# Patient Record
Sex: Female | Born: 1965 | Race: White | Hispanic: No | Marital: Married | Smoking: Never smoker
Health system: Southern US, Community
[De-identification: ages and names within clinical notes are randomized; demographics above are authoritative.]

## PROBLEM LIST (undated history)

## (undated) DIAGNOSIS — Z87442 Personal history of urinary calculi: Secondary | ICD-10-CM

## (undated) DIAGNOSIS — N189 Chronic kidney disease, unspecified: Secondary | ICD-10-CM

## (undated) DIAGNOSIS — M1990 Unspecified osteoarthritis, unspecified site: Secondary | ICD-10-CM

## (undated) DIAGNOSIS — I10 Essential (primary) hypertension: Secondary | ICD-10-CM

## (undated) DIAGNOSIS — J45909 Unspecified asthma, uncomplicated: Secondary | ICD-10-CM

## (undated) DIAGNOSIS — R87619 Unspecified abnormal cytological findings in specimens from cervix uteri: Secondary | ICD-10-CM

## (undated) DIAGNOSIS — J189 Pneumonia, unspecified organism: Secondary | ICD-10-CM

## (undated) HISTORY — PX: LITHOTRIPSY: SUR834

## (undated) HISTORY — PX: OTHER SURGICAL HISTORY: SHX169

## (undated) HISTORY — PX: TUBAL LIGATION: SHX77

## (undated) HISTORY — DX: Unspecified asthma, uncomplicated: J45.909

## (undated) HISTORY — PX: CHOLECYSTECTOMY: SHX55

## (undated) HISTORY — DX: Chronic kidney disease, unspecified: N18.9

## (undated) HISTORY — DX: Unspecified abnormal cytological findings in specimens from cervix uteri: R87.619

## (undated) HISTORY — DX: Pneumonia, unspecified organism: J18.9

---

## 1998-01-17 HISTORY — PX: OTHER SURGICAL HISTORY: SHX169

## 1999-01-18 HISTORY — PX: TUBAL LIGATION: SHX77

## 2008-01-18 HISTORY — PX: CHOLECYSTECTOMY: SHX55

## 2010-01-17 HISTORY — PX: LITHOTRIPSY: SUR834

## 2011-10-03 LAB — HM MAMMOGRAPHY

## 2015-08-11 ENCOUNTER — Ambulatory Visit (INDEPENDENT_AMBULATORY_CARE_PROVIDER_SITE_OTHER): Payer: BLUE CROSS/BLUE SHIELD | Admitting: Family Medicine

## 2015-08-11 ENCOUNTER — Encounter: Payer: Self-pay | Admitting: Family Medicine

## 2015-08-11 ENCOUNTER — Other Ambulatory Visit: Payer: Self-pay | Admitting: Family Medicine

## 2015-08-11 VITALS — BP 152/106 | HR 74 | Ht 61.5 in | Wt 311.2 lb

## 2015-08-11 DIAGNOSIS — R0989 Other specified symptoms and signs involving the circulatory and respiratory systems: Secondary | ICD-10-CM | POA: Diagnosis not present

## 2015-08-11 DIAGNOSIS — Z833 Family history of diabetes mellitus: Secondary | ICD-10-CM | POA: Insufficient documentation

## 2015-08-11 DIAGNOSIS — Z823 Family history of stroke: Secondary | ICD-10-CM | POA: Insufficient documentation

## 2015-08-11 DIAGNOSIS — Z6841 Body Mass Index (BMI) 40.0 and over, adult: Secondary | ICD-10-CM

## 2015-08-11 DIAGNOSIS — Z9851 Tubal ligation status: Secondary | ICD-10-CM | POA: Insufficient documentation

## 2015-08-11 DIAGNOSIS — J989 Respiratory disorder, unspecified: Secondary | ICD-10-CM | POA: Insufficient documentation

## 2015-08-11 DIAGNOSIS — N200 Calculus of kidney: Secondary | ICD-10-CM | POA: Insufficient documentation

## 2015-08-11 DIAGNOSIS — Z8249 Family history of ischemic heart disease and other diseases of the circulatory system: Secondary | ICD-10-CM | POA: Insufficient documentation

## 2015-08-11 DIAGNOSIS — Z0189 Encounter for other specified special examinations: Secondary | ICD-10-CM

## 2015-08-11 DIAGNOSIS — T7840XA Allergy, unspecified, initial encounter: Secondary | ICD-10-CM | POA: Insufficient documentation

## 2015-08-11 DIAGNOSIS — J3089 Other allergic rhinitis: Secondary | ICD-10-CM | POA: Diagnosis not present

## 2015-08-11 DIAGNOSIS — E559 Vitamin D deficiency, unspecified: Secondary | ICD-10-CM | POA: Insufficient documentation

## 2015-08-11 LAB — POCT GLYCOSYLATED HEMOGLOBIN (HGB A1C): HEMOGLOBIN A1C: 5.1

## 2015-08-11 NOTE — Progress Notes (Signed)
New patient office visit note:  Impression and Recommendations:    1. Obesity   2. Reactive airway disease that is not asthma   3. Environmental and seasonal allergies   4. h/o Nephrolithiasis    5. Family history of diabetes mellitus   6. Family history of stroke   7. Family history of hypertension    We will obtain fasting blood work today  A second blood pressure was obtained in the office today which showed 152/106.   Patient will check her blood pressure over the next couple weeks and follow-up with me. She will bring a log of her blood pressures that she checks at home.  No prior history hypertension or elevated blood pressure.  Patient has personal health forms that need to be filled out; for her medical insurance purposes. They need cholesterol panel, blood pressure and others. She will return to clinic later in the week to get the completed forms  Normal BMI discussed, need for weight loss   Patient's Medications   No medications on file    Return in about 4 weeks (around 09/08/2015) for Blood pressure recheck.  The patient was counseled, risk factors were discussed, anticipatory guidance given.  Gross side effects, risk and benefits, and alternatives of medications discussed with patient.  Patient is aware that all medications have potential side effects and we are unable to predict every side effect or drug-drug interaction that may occur.  Expresses verbal understanding and consents to current therapy plan and treatment regimen.  Please see AVS handed out to patient at the end of our visit for further patient instructions/ counseling done pertaining to today's office visit.    Note: This document was prepared using Dragon voice recognition software and may include unintentional dictation errors.  ----------------------------------------------------------------------------------------------------------------------    Subjective:    Chief Complaint    Patient presents with  . Establish Care    HPI: Abigail Cruz is a pleasant 50 y.o. female who presents to [place] [facility] today to review their medical history with me and establish care.   Patient needs paperwork filled out for her work which is a yearly physical type screening for her medical insurance purposes. She needs cholesterol obtained today and her blood pressure and other vitals.  She has no acute complaints today and her problem list was updated with me today. She takes no medicines even over-the-counter  She has no history of hypertension and says her prior office visits were 120 over 44s. She does not check it at home. She has no complaints of chest pain, shortness of breath, dizziness or headache or visual changes.     Patient Active Problem List   Diagnosis Date Noted  . Obesity 08/11/2015  . Reactive airway disease that is not asthma 08/11/2015  . Environmental and seasonal allergies 08/11/2015  . h/o Nephrolithiasis  08/11/2015  . H/O tubal ligation 08/11/2015  . Family history of diabetes mellitus 08/11/2015  . Family history of stroke 08/11/2015  . Family history of hypertension 08/11/2015     Past Medical History:  Diagnosis Date  . Chronic kidney disease    kidney stones  . Pneumonia      Past Surgical History:  Procedure Laterality Date  . CHOLECYSTECTOMY    . TUBAL LIGATION       Family History  Problem Relation Age of Onset  . Hypertension Mother   . Diabetes Mother   . Cancer Mother  cervical  . Hypertension Father   . Cancer Maternal Aunt     breast  . Cancer Paternal Aunt     breast  . Heart attack Paternal Grandfather   . Stroke Paternal Grandfather      History  Drug Use No    History  Alcohol Use No    History  Smoking Status  . Never Smoker  Smokeless Tobacco  . Never Used    Patient's Medications   No medications on file    Allergies: Bee venom; Iodine; Other; Latex; and  Shellfish-derived products  Review of Systems:   ( Completed via Adult Medical History Intake form today ) General:  Denies fever, chills, appetite changes, unexplained weight loss.  Optho/Auditory:   Denies visual changes, blurred vision/LOV, ringing in ears/ diff hearing Respiratory:   Denies SOB, DOE, cough, wheezing.  Cardiovascular:   Denies chest pain, palpitations, new onset peripheral edema  Gastrointestinal:   Denies nausea, vomiting, diarrhea.  Genitourinary:    Denies dysuria, increased frequency, flank pain.  Endocrine:     Denies hot or cold intolerance, polyuria, polydipsia. Musculoskeletal:  Denies unexplained myalgias, joint swelling, arthralgias, gait problems.  Skin:  Denies rash, suspicious lesions or new/ changes in moles Neurological:    Denies dizziness, syncope, unexplained weakness, lightheadedness, numbness  Psychiatric/Behavioral:   Denies mood changes, suicidal or homicidal ideations, hallucinations    Objective:   Blood pressure (!) 157/84, pulse 74, height 5' 1.5" (1.562 m), weight (!) 311 lb 3.2 oz (141.2 kg), last menstrual period 07/24/2015. Body mass index is 57.85 kg/m.  General: Well Developed, well nourished, and in no acute distress.  Neuro: Alert and oriented x3, extra-ocular muscles intact, sensation grossly intact.  HEENT: Normocephalic, atraumatic, pupils equal round reactive to light, neck supple, no carotid bruits, no JVD apprec Skin: no gross suspicious lesions or rashes  Cardiac: Very distant, difficult to auscultate due to body habitus but essentially regular rate and rhythm, no murmurs rubs or gallops.  Respiratory: Distant, Essentially clear to auscultation bilaterally. Not using accessory muscles, speaking in full sentences.  Musculoskeletal: Ambulates w/o diff, FROM * 4 ext.  Vasc: less 2 sec cap RF, warm and pink  Psych:  No HI/SI, judgement and insight good.

## 2015-08-11 NOTE — Patient Instructions (Signed)
DASH Eating Plan °DASH stands for "Dietary Approaches to Stop Hypertension." The DASH eating plan is a healthy eating plan that has been shown to reduce high blood pressure (hypertension). Additional health benefits may include reducing the risk of type 2 diabetes mellitus, heart disease, and stroke. The DASH eating plan may also help with weight loss. °WHAT DO I NEED TO KNOW ABOUT THE DASH EATING PLAN? °For the DASH eating plan, you will follow these general guidelines: °· Choose foods with a percent daily value for sodium of less than 5% (as listed on the food label). °· Use salt-free seasonings or herbs instead of table salt or sea salt. °· Check with your health care provider or pharmacist before using salt substitutes. °· Eat lower-sodium products, often labeled as "lower sodium" or "no salt added." °· Eat fresh foods. °· Eat more vegetables, fruits, and low-fat dairy products. °· Choose whole grains. Look for the word "whole" as the first word in the ingredient list. °· Choose fish and skinless chicken or turkey more often than red meat. Limit fish, poultry, and meat to 6 oz (170 g) each day. °· Limit sweets, desserts, sugars, and sugary drinks. °· Choose heart-healthy fats. °· Limit cheese to 1 oz (28 g) per day. °· Eat more home-cooked food and less restaurant, buffet, and fast food. °· Limit fried foods. °· Cook foods using methods other than frying. °· Limit canned vegetables. If you do use them, rinse them well to decrease the sodium. °· When eating at a restaurant, ask that your food be prepared with less salt, or no salt if possible. °WHAT FOODS CAN I EAT? °Seek help from a dietitian for individual calorie needs. °Grains °Whole grain or whole wheat bread. Brown rice. Whole grain or whole wheat pasta. Quinoa, bulgur, and whole grain cereals. Low-sodium cereals. Corn or whole wheat flour tortillas. Whole grain cornbread. Whole grain crackers. Low-sodium crackers. °Vegetables °Fresh or frozen vegetables  (raw, steamed, roasted, or grilled). Low-sodium or reduced-sodium tomato and vegetable juices. Low-sodium or reduced-sodium tomato sauce and paste. Low-sodium or reduced-sodium canned vegetables.  °Fruits °All fresh, canned (in natural juice), or frozen fruits. °Meat and Other Protein Products °Ground beef (85% or leaner), grass-fed beef, or beef trimmed of fat. Skinless chicken or turkey. Ground chicken or turkey. Pork trimmed of fat. All fish and seafood. Eggs. Dried beans, peas, or lentils. Unsalted nuts and seeds. Unsalted canned beans. °Dairy °Low-fat dairy products, such as skim or 1% milk, 2% or reduced-fat cheeses, low-fat ricotta or cottage cheese, or plain low-fat yogurt. Low-sodium or reduced-sodium cheeses. °Fats and Oils °Tub margarines without trans fats. Light or reduced-fat mayonnaise and salad dressings (reduced sodium). Avocado. Safflower, olive, or canola oils. Natural peanut or almond butter. °Other °Unsalted popcorn and pretzels. °The items listed above may not be a complete list of recommended foods or beverages. Contact your dietitian for more options. °WHAT FOODS ARE NOT RECOMMENDED? °Grains °White bread. White pasta. White rice. Refined cornbread. Bagels and croissants. Crackers that contain trans fat. °Vegetables °Creamed or fried vegetables. Vegetables in a cheese sauce. Regular canned vegetables. Regular canned tomato sauce and paste. Regular tomato and vegetable juices. °Fruits °Dried fruits. Canned fruit in light or heavy syrup. Fruit juice. °Meat and Other Protein Products °Fatty cuts of meat. Ribs, chicken wings, bacon, sausage, bologna, salami, chitterlings, fatback, hot dogs, bratwurst, and packaged luncheon meats. Salted nuts and seeds. Canned beans with salt. °Dairy °Whole or 2% milk, cream, half-and-half, and cream cheese. Whole-fat or sweetened yogurt. Full-fat   cheeses or blue cheese. Nondairy creamers and whipped toppings. Processed cheese, cheese spreads, or cheese  curds. °Condiments °Onion and garlic salt, seasoned salt, table salt, and sea salt. Canned and packaged gravies. Worcestershire sauce. Tartar sauce. Barbecue sauce. Teriyaki sauce. Soy sauce, including reduced sodium. Steak sauce. Fish sauce. Oyster sauce. Cocktail sauce. Horseradish. Ketchup and mustard. Meat flavorings and tenderizers. Bouillon cubes. Hot sauce. Tabasco sauce. Marinades. Taco seasonings. Relishes. °Fats and Oils °Butter, stick margarine, lard, shortening, ghee, and bacon fat. Coconut, palm kernel, or palm oils. Regular salad dressings. °Other °Pickles and olives. Salted popcorn and pretzels. °The items listed above may not be a complete list of foods and beverages to avoid. Contact your dietitian for more information. °WHERE CAN I FIND MORE INFORMATION? °[employer], [employer]: www.nhlbi.nih.gov/health/health-topics/topics/dash/ °  °This information is not intended to replace advice given to you by your health care provider. Make sure you discuss any questions you have with your health care provider. °  °Document Released: 12/23/2010 Document Revised: 01/24/2014 Document Reviewed: 11/07/2012 °Elsevier Interactive Patient Education ©2016 [employer] ° °Hypertension °Hypertension, commonly called high blood pressure, is when the force of blood pumping through your arteries is too strong. Your arteries are the blood vessels that carry blood from your heart throughout your body. A blood pressure reading consists of a higher number over a lower number, such as 110/72. The higher number (systolic) is the pressure inside your arteries when your heart pumps. The lower number (diastolic) is the pressure inside your arteries when your heart relaxes. Ideally you want your blood pressure below 120/80. °Hypertension forces your heart to work harder to pump blood. Your arteries may become narrow or stiff. Having untreated or uncontrolled hypertension can cause heart attack, stroke, kidney  disease, and other problems. °RISK FACTORS °Some risk factors for high blood pressure are controllable. Others are not.  °Risk factors you cannot control include:  °· Race. You may be at higher risk if you are African American. °· Age. Risk increases with age. °· Gender. Men are at higher risk than women before age 45 years. After age 65, women are at higher risk than men. °Risk factors you can control include: °· Not getting enough exercise or physical activity. °· Being overweight. °· Getting too much fat, sugar, calories, or salt in your diet. °· Drinking too much alcohol. °SIGNS AND SYMPTOMS °Hypertension does not usually cause signs or symptoms. Extremely high blood pressure (hypertensive crisis) may cause headache, anxiety, shortness of breath, and nosebleed. °DIAGNOSIS °To check if you have hypertension, your health care provider will measure your blood pressure while you are seated, with your arm held at the level of your heart. It should be measured at least twice using the same arm. Certain conditions can cause a difference in blood pressure between your right and left arms. A blood pressure reading that is higher than normal on one occasion does not mean that you need treatment. If it is not clear whether you have high blood pressure, you may be asked to return on a different day to have your blood pressure checked again. Or, you may be asked to monitor your blood pressure at home for 1 or more weeks. °TREATMENT °Treating high blood pressure includes making lifestyle changes and possibly taking medicine. Living a healthy lifestyle can help lower high blood pressure. You may need to change some of your habits. °Lifestyle changes may include: °· Following the DASH diet. This diet is high in fruits, vegetables, and whole   grains. It is low in salt, red meat, and added sugars. °· Keep your sodium intake below 2,300 mg per day. °· Getting at least 30-45 minutes of aerobic exercise at least 4 times per  week. °· Losing weight if necessary. °· Not smoking. °· Limiting alcoholic beverages. °· Learning ways to reduce stress. °Your health care provider may prescribe medicine if lifestyle changes are not enough to get your blood pressure under control, and if one of the following is true: °· You are 18-59 years of age and your systolic blood pressure is above 140. °· You are 60 years of age or older, and your systolic blood pressure is above 150. °· Your diastolic blood pressure is above 90. °· You have diabetes, and your systolic blood pressure is over 140 or your diastolic blood pressure is over 90. °· You have kidney disease and your blood pressure is above 140/90. °· You have heart disease and your blood pressure is above 140/90. °Your personal target blood pressure may vary depending on your medical conditions, your age, and other factors. °HOME CARE INSTRUCTIONS °· Have your blood pressure rechecked as directed by your health care provider.   °· Take medicines only as directed by your health care provider. Follow the directions carefully. Blood pressure medicines must be taken as prescribed. The medicine does not work as well when you skip doses. Skipping doses also puts you at risk for problems. °· Do not smoke.   °· Monitor your blood pressure at home as directed by your health care provider.  °SEEK MEDICAL CARE IF:  °· You think you are having a reaction to medicines taken. °· You have recurrent headaches or feel dizzy. °· You have swelling in your ankles. °· You have trouble with your vision. °SEEK IMMEDIATE MEDICAL CARE IF: °· You develop a severe headache or confusion. °· You have unusual weakness, numbness, or feel faint. °· You have severe chest or abdominal pain. °· You vomit repeatedly. °· You have trouble breathing. °MAKE SURE YOU:  °· Understand these instructions. °· Will watch your condition. °· Will get help right away if you are not doing well or get worse. °  °This information is not intended to  replace advice given to you by your health care provider. Make sure you discuss any questions you have with your health care provider. °  °Document Released: 01/03/2005 Document Revised: 05/20/2014 Document Reviewed: 10/26/2012 °Elsevier Interactive Patient Education ©2016 [employer] ° °

## 2015-08-12 DIAGNOSIS — E781 Pure hyperglyceridemia: Secondary | ICD-10-CM | POA: Insufficient documentation

## 2015-08-12 DIAGNOSIS — R748 Abnormal levels of other serum enzymes: Secondary | ICD-10-CM | POA: Insufficient documentation

## 2015-08-12 LAB — CBC WITH DIFFERENTIAL/PLATELET
BASOS PCT: 1 %
Basophils Absolute: 108 cells/uL (ref 0–200)
EOS PCT: 3 %
Eosinophils Absolute: 324 cells/uL (ref 15–500)
HEMATOCRIT: 40.9 % (ref 35.0–45.0)
Hemoglobin: 13.3 g/dL (ref 11.7–15.5)
LYMPHS PCT: 20 %
Lymphs Abs: 2160 cells/uL (ref 850–3900)
MCH: 27.6 pg (ref 27.0–33.0)
MCHC: 32.5 g/dL (ref 32.0–36.0)
MCV: 84.9 fL (ref 80.0–100.0)
MONO ABS: 756 {cells}/uL (ref 200–950)
MPV: 9.7 fL (ref 7.5–12.5)
Monocytes Relative: 7 %
NEUTROS PCT: 69 %
Neutro Abs: 7452 cells/uL (ref 1500–7800)
PLATELETS: 289 10*3/uL (ref 140–400)
RBC: 4.82 MIL/uL (ref 3.80–5.10)
RDW: 14.3 % (ref 11.0–15.0)
WBC: 10.8 10*3/uL (ref 3.8–10.8)

## 2015-08-12 LAB — COMPLETE METABOLIC PANEL WITH GFR
ALT: 13 U/L (ref 6–29)
AST: 12 U/L (ref 10–35)
Albumin: 3.8 g/dL (ref 3.6–5.1)
Alkaline Phosphatase: 73 U/L (ref 33–115)
BUN: 13 mg/dL (ref 7–25)
CALCIUM: 9 mg/dL (ref 8.6–10.2)
CHLORIDE: 102 mmol/L (ref 98–110)
CO2: 25 mmol/L (ref 20–31)
CREATININE: 0.6 mg/dL (ref 0.50–1.10)
GFR, Est African American: >89 mL/min (ref >=60)
GFR, Est Non African American: >89 mL/min (ref >=60)
GLUCOSE: 89 mg/dL (ref 65–99)
POTASSIUM: 4.9 mmol/L (ref 3.5–5.3)
SODIUM: 137 mmol/L (ref 135–146)
Total Bilirubin: 0.5 mg/dL (ref 0.2–1.2)
Total Protein: 6.5 g/dL (ref 6.1–8.1)

## 2015-08-12 LAB — LIPID PANEL
CHOL/HDL RATIO: 3.2 ratio (ref <=5.0)
CHOLESTEROL: 138 mg/dL (ref 125–200)
HDL: 43 mg/dL — ABNORMAL LOW (ref >=46)
LDL CALC: 65 mg/dL (ref <130)
Triglycerides: 151 mg/dL — ABNORMAL HIGH (ref <150)
VLDL: 30 mg/dL (ref <30)

## 2015-08-12 LAB — TSH: TSH: 2.25 m[IU]/L

## 2015-08-18 ENCOUNTER — Other Ambulatory Visit: Payer: Self-pay

## 2015-08-18 LAB — VITAMIN D 25 HYDROXY (VIT D DEFICIENCY, FRACTURES): Vit D, 25-Hydroxy: 18 ng/mL — ABNORMAL LOW (ref 30–100)

## 2015-08-18 MED ORDER — VITAMIN D (ERGOCALCIFEROL) 1.25 MG (50000 UNIT) PO CAPS: 50000.0000 [IU] | ORAL_CAPSULE | ORAL | 3 refills | Status: DC

## 2015-09-07 ENCOUNTER — Encounter: Payer: Self-pay | Admitting: Family Medicine

## 2015-09-07 ENCOUNTER — Ambulatory Visit (INDEPENDENT_AMBULATORY_CARE_PROVIDER_SITE_OTHER): Payer: BLUE CROSS/BLUE SHIELD | Admitting: Family Medicine

## 2015-09-07 VITALS — BP 120/86 | HR 101 | Wt 311.0 lb

## 2015-09-07 DIAGNOSIS — R748 Abnormal levels of other serum enzymes: Secondary | ICD-10-CM

## 2015-09-07 DIAGNOSIS — E781 Pure hyperglyceridemia: Secondary | ICD-10-CM

## 2015-09-07 DIAGNOSIS — R030 Elevated blood-pressure reading, without diagnosis of hypertension: Secondary | ICD-10-CM | POA: Diagnosis not present

## 2015-09-07 DIAGNOSIS — R0989 Other specified symptoms and signs involving the circulatory and respiratory systems: Secondary | ICD-10-CM

## 2015-09-07 DIAGNOSIS — J3089 Other allergic rhinitis: Secondary | ICD-10-CM | POA: Diagnosis not present

## 2015-09-07 DIAGNOSIS — Z6841 Body Mass Index (BMI) 40.0 and over, adult: Secondary | ICD-10-CM

## 2015-09-07 DIAGNOSIS — E559 Vitamin D deficiency, unspecified: Secondary | ICD-10-CM

## 2015-09-07 DIAGNOSIS — J989 Respiratory disorder, unspecified: Secondary | ICD-10-CM

## 2015-09-07 MED ORDER — MONTELUKAST SODIUM 10 MG PO TABS: 10.0000 mg | ORAL_TABLET | Freq: Every day | ORAL | 1 refills | Status: DC

## 2015-09-07 MED ORDER — ALBUTEROL SULFATE HFA 108 (90 BASE) MCG/ACT IN AERS: INHALATION_SPRAY | RESPIRATORY_TRACT | 1 refills | Status: DC

## 2015-09-07 MED ORDER — BUDESONIDE-FORMOTEROL FUMARATE 160-4.5 MCG/ACT IN AERO: 2.0000 | INHALATION_SPRAY | Freq: Two times a day (BID) | RESPIRATORY_TRACT | 3 refills | Status: DC

## 2015-09-07 NOTE — Progress Notes (Signed)
Impression and Recommendations:    1. 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 Environmental and seasonal allergies   3. Borderline diastolic hypertension   4. Morbid obesity with BMI of 50.0-59.9, adult ([place])   5. Vitamin D deficiency   6. Hypertriglyceridemia without hypercholesterolemia   7. Low serum HDL     Reactive airway disease that is allergen mediated Start with the singular first every night- take that for 2-3 weeks and then if her symptoms are still not well controlled, start the Symbicort daily.  She understands this is a steroid and needs to flush her mouth out after use.  Patient given a albuterol inhaler prescription just in case.  She knows how to use it.  We discussed her avoiding certain irritants and different preventative strategies.  Unfortunately she apparently just cannot as she travels a lot for work.  Borderline diastolic hypertension - Education on hypertension provided - Patient encouraged to continue to monitor her blood pressure - DASH DIET/ Mediterranean diets d/c pt and handouts given.    - Encouraged dietary and lifestyle modification - she understands weight loss would help tremendously   Environmental and seasonal allergies Patient aware that Singulair will help with seasonal allergies- which is right upon [place].   - over-the-counter Allegra - Milta Deiters med sinus rinses twice a day  - environmental avoidance  Morbid obesity with BMI of 50.0-59.9, adult ([place]) Dietary and lifestyle counseling done. Weight loss encouraged  Vitamin D deficiency Continue supplementation and we can recheck 4-6 months  Hypertriglyceridemia without hypercholesterolemia Handouts provided on prudent diet, counseling done   Low serum HDL Counseling done; daily exercise, even of 15 minutes encouraged.     Patient's Medications  New Prescriptions   ALBUTEROL (PROVENTIL HFA;VENTOLIN HFA) 108 (90 BASE) MCG/ACT INHALER    Only use as rescue inhaler, take 2  puffs if you feel acutely short of breath.   BUDESONIDE-FORMOTEROL (SYMBICORT) 160-4.5 MCG/ACT INHALER    Inhale 2 puffs into the lungs 2 (two) times daily.   MONTELUKAST (SINGULAIR) 10 MG TABLET    Take 1 tablet (10 mg total) by mouth at bedtime.  Previous Medications   VITAMIN D, ERGOCALCIFEROL, (DRISDOL) 50000 UNITS CAPS CAPSULE    Take 1 capsule (50,000 Units total) by mouth every 7 (seven) days.  Modified Medications   No medications on file  Discontinued Medications   No medications on file    Return in about 6 weeks (around 10/19/2015) for check on new meds given for allergens/ breathing.  The patient was counseled, risk factors were discussed, anticipatory guidance given.  Gross side effects, risk and benefits, and alternatives of medications discussed with patient.  Patient is aware that all medications have potential side effects and we are unable to predict every side effect or drug-drug interaction that may occur.  Expresses verbal understanding and consents to current therapy plan and treatment regimen.  Please see AVS handed out to patient at the end of our visit for further patient instructions/ counseling done pertaining to today's office visit.    Note: This document was prepared using Dragon voice recognition software and may include unintentional dictation errors.   --------------------------------------------------------------------------------------------------------------------------------------------------------------------------------------------------------------------------------------------    Subjective:    CC:  Chief Complaint  Patient presents with  . Follow-up    elevated blood pressure    HPI: Abigail Cruz is a 50 y.o. female who presents to [place] at [facility] today for follow-up of her elevated blood pressure  from last office visit at 152/106.   Vitamin D deficiency:  Since patient has been on the vitamin D supplement she  is felt so much better. Less fatigue, arthralgias and myalgias.  High blood pressure:  Been checking BP at work- running 130s-140/79-88.  Drinking less of sweet teas    pt used to take symbicort in past for breathing - patient travels a lot and with being in airports, airplanes and hotels, sometimes the air is dry and there are certain allergens in the air that irritate her.  She would like to restart Symbicort and get a rescue inhaler just in case.   Wt Readings from Last 3 Encounters:  09/07/15 (!) 311 lb (141.1 kg)  08/11/15 (!) 311 lb 3.2 oz (141.2 kg)   BP Readings from Last 3 Encounters:  09/07/15 120/86  08/11/15 (!) 152/106   Pulse Readings from Last 3 Encounters:  09/07/15 (!) 101  08/11/15 74     Patient Active Problem List   Diagnosis Date Noted  . Borderline diastolic hypertension 72/09/4707  . Hypertriglyceridemia without hypercholesterolemia 08/12/2015  . Low serum HDL 08/12/2015  . 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 Environmental and seasonal allergies 08/11/2015  . h/o Nephrolithiasis  08/11/2015  . H/O tubal ligation 08/11/2015  . Family history of diabetes mellitus 08/11/2015  . Family history of stroke 08/11/2015  . Family history of hypertension 08/11/2015  . Morbid obesity with BMI of 50.0-59.9, adult ([place]) 08/11/2015  . Vitamin D deficiency 08/11/2015    Past Medical history, Surgical history, Family history, Social history, Allergies and Medications have been entered into the medical record, reviewed and changed as needed.   Allergies:  Allergies  Allergen Reactions  . Bee Venom Anaphylaxis  . Iodine Dermatitis and Rash  . Other Anaphylaxis    ANT bites  . Latex Other (See Comments)  . Shellfish-Derived Products Palpitations and Rash    Review of Systems: No fever/ chills, night sweats, no unintended weight loss, No chest pain, or increased shortness of breath. No N/V/D.  Pertinent positives and negatives noted in  HPI above    Objective:   Blood pressure 120/86, pulse (!) 101, weight (!) 311 lb (141.1 kg), last menstrual period 08/21/2015. Body mass index is 57.81 kg/m.  General: Well Developed, well nourished, appropriate for stated age.  Neuro: Alert and oriented x3, extra-ocular muscles intact, sensation grossly intact.  HEENT: Normocephalic, atraumatic, neck supple   Skin: Warm and dry, no gross rash. Cardiac: Distant and difficult to auscultate but essentially RRR, S1 S2,  no murmurs rubs or gallops.  Respiratory:  Distant and difficult to auscultate but ECTA B/L, Not using accessory muscles, speaking in full sentences-unlabored. Vascular:  No gross lower ext edema, cap RF less 2 sec. Psych: No SI/HI, Insight and judgement good  Recent Results (from the past 2160 hour(s))  CBC with Differential/Platelet     Status: None   Collection Time: 08/11/15  9:13 AM  Result Value Ref Range   WBC 10.8 3.8 - 10.8 K/uL   RBC 4.82 3.80 - 5.10 MIL/uL   Hemoglobin 13.3 11.7 - 15.5 g/dL   HCT 40.9 35.0 - 45.0 %   MCV 84.9 80.0 - 100.0 fL   MCH 27.6 27.0 - 33.0 pg   MCHC 32.5 32.0 - 36.0 g/dL   RDW 14.3 11.0 - 15.0 %   Platelets 289 140 - 400 K/uL   MPV 9.7 7.5 - 12.5 fL   Neutro Abs 7,452 1,500 -  7,800 cells/uL   Lymphs Abs 2,160 850 - 3,900 cells/uL   Monocytes Absolute 756 200 - 950 cells/uL   Eosinophils Absolute 324 15 - 500 cells/uL   Basophils Absolute 108 0 - 200 cells/uL   Neutrophils Relative % 69 %   Lymphocytes Relative 20 %   Monocytes Relative 7 %   Eosinophils Relative 3 %   Basophils Relative 1 %   Smear Review Criteria for review not met     Comment: ** Please note change in unit of measure and reference range(s). **  COMPLETE METABOLIC PANEL WITH GFR     Status: None   Collection Time: 08/11/15  9:13 AM  Result Value Ref Range   Sodium 137 135 - 146 mmol/L   Potassium 4.9 3.5 - 5.3 mmol/L   Chloride 102 98 - 110 mmol/L   CO2 25 20 - 31 mmol/L   Glucose, Bld 89 65 - 99  mg/dL   BUN 13 7 - 25 mg/dL   Creat 0.60 0.50 - 1.10 mg/dL   Total Bilirubin 0.5 0.2 - 1.2 mg/dL   Alkaline Phosphatase 73 33 - 115 U/L   AST 12 10 - 35 U/L   ALT 13 6 - 29 U/L   Total Protein 6.5 6.1 - 8.1 g/dL   Albumin 3.8 3.6 - 5.1 g/dL   Calcium 9.0 8.6 - 10.2 mg/dL   GFR, Est African American >89 >=60 mL/min   GFR, Est Non African American >89 >=60 mL/min  Lipid panel     Status: Abnormal   Collection Time: 08/11/15  9:13 AM  Result Value Ref Range   Cholesterol 138 125 - 200 mg/dL   Triglycerides 151 (H) <150 mg/dL   HDL 43 (L) >=46 mg/dL   Total CHOL/HDL Ratio 3.2 <=5.0 Ratio   VLDL 30 <30 mg/dL   LDL Cholesterol 65 <130 mg/dL    Comment:   Total Cholesterol/HDL Ratio:CHD Risk                        Coronary Heart Disease Risk Table                                        Men       Women          1/2 Average Risk              3.4        3.3              Average Risk              5.0        4.4           2X Average Risk              9.6        7.1           3X Average Risk             23.4       11.0 Use the calculated Patient Ratio above and the CHD Risk table  to determine the patient's CHD Risk.   TSH     Status: None   Collection Time: 08/11/15  9:13 AM  Result Value Ref Range   TSH 2.25 mIU/L    Comment:   Reference Range   >  or = 20 Years  0.40-4.50   Pregnancy Range First trimester  0.26-2.66 Second trimester 0.55-2.73 Third trimester  0.43-2.91     VITAMIN D 25 Hydroxy (Vit-D Deficiency, Fractures)     Status: Abnormal   Collection Time: 08/11/15  9:13 AM  Result Value Ref Range   Vit D, 25-Hydroxy 18 (L) 30 - 100 ng/mL    Comment: Vitamin D Status           25-OH Vitamin D        Deficiency                <20 ng/mL        Insufficiency         20 - 29 ng/mL        Optimal             > or = 30 ng/mL   For 25-OH Vitamin D testing on patients on D2-supplementation and patients for whom quantitation of D2 and D3 fractions is required,  the QuestAssureD 25-OH VIT D, (D2,D3), LC/MS/MS is recommended: order code [id number] (patients > 2 yrs).   POCT glycosylated hemoglobin (Hb A1C)     Status: Normal   Collection Time: 08/11/15  9:41 AM  Result Value Ref Range   Hemoglobin A1C 5.1

## 2015-09-07 NOTE — Patient Instructions (Addendum)
Start with the singular first every night- take that for 2-3 weeks and then if her symptoms are still not well controlled, start the Symbicort daily.        Guidelines for a Low Cholesterol, Low Saturated Fat Diet   Fats - Limit total intake of fats and oils. - Avoid butter, stick margarine, shortening, lard, palm and coconut oils. - Limit mayonnaise, salad dressings, gravies and sauces, unless they are homemade with low-fat ingredients. - Limit chocolate. - Choose low-fat and nonfat products, such as low-fat mayonnaise, low-fat or non-hydrogenated peanut butter, low-fat or fat-free salad dressings and nonfat gravy. - Use vegetable oil, such as canola or olive oil. - Look for margarine that does not contain trans fatty acids. - Use nuts in moderate amounts. - Read ingredient labels carefully to determine both amount and type of fat present in foods. Limit saturated and trans fats! - Avoid high-fat processed and convenience foods.  Meats and Meat Alternatives - Choose fish, chicken, [place] and lean meats. - Use dried beans, peas, lentils and tofu. - Limit egg yolks to three to four per week. - If you eat red meat, limit to no more than three servings per week and choose loin or round cuts. - Avoid fatty meats, such as bacon, sausage, franks, luncheon meats and ribs. - Avoid all organ meats, including liver.  Dairy - Choose nonfat or low-fat milk, yogurt and cottage cheese. - Most cheeses are high in fat. Choose cheeses made from non-fat milk, such as mozzarella and ricotta cheese. - Choose light or fat-free cream cheese and sour cream. - Avoid cream and sauces made with cream.  Fruits and Vegetables - Eat a wide variety of fruits and vegetables. - Use lemon juice, vinegar or "mist" olive oil on vegetables. - Avoid adding sauces, fat or oil to vegetables.  Breads, Cereals and Grains - Choose whole-grain breads, cereals, pastas and rice. - Avoid high-fat snack foods, such as  granola, cookies, pies, pastries, doughnuts and croissants.  Cooking Tips - Avoid deep fried foods. - Trim visible fat off meats and remove skin from poultry before cooking. - Bake, broil, boil, poach or roast poultry, fish and lean meats. - Drain and discard fat that drains out of meat as you cook it. - Add little or no fat to foods. - Use vegetable oil sprays to grease pans for cooking or baking. - Steam vegetables. - Use herbs or no-oil marinades to flavor foods.      Exercising to Lose Weight Exercising can help you to lose weight. In order to lose weight through exercise, you need to do vigorous-intensity exercise. You can tell that you are exercising with vigorous intensity if you are breathing very hard and fast and cannot hold a conversation while exercising. Moderate-intensity exercise helps to maintain your current weight. You can tell that you are exercising at a moderate level if you have a higher heart rate and faster breathing, but you are still able to hold a conversation. HOW OFTEN SHOULD I EXERCISE? Choose an activity that you enjoy and set realistic goals. Your health care provider can help you to make an activity plan that works for you. Exercise regularly as directed by your health care provider. This may include:  Doing resistance training twice each week, such as:  Push-ups.  Sit-ups.  Lifting weights.  Using resistance bands.  Doing a given intensity of exercise for a given amount of time. Choose from these options:  150 minutes of moderate-intensity exercise every  week.  75 minutes of vigorous-intensity exercise every week.  A mix of moderate-intensity and vigorous-intensity exercise every week. Children, pregnant women, people who are out of shape, people who are overweight, and older adults may need to consult a health care provider for individual recommendations. If you have any sort of medical condition, be sure to consult your health care provider  before starting a new exercise program. WHAT ARE SOME ACTIVITIES THAT CAN HELP ME TO LOSE WEIGHT?   Walking at a rate of at least 4.5 miles an hour.  Jogging or running at a rate of 5 miles per hour.  Biking at a rate of at least 10 miles per hour.  Lap swimming.  Roller-skating or in-line skating.  Cross-country skiing.  Vigorous competitive sports, such as football, basketball, and soccer.  Jumping rope.  Aerobic dancing. HOW CAN I BE MORE ACTIVE IN MY DAY-TO-DAY ACTIVITIES?  Use the stairs instead of the elevator.  Take a walk during your lunch break.  If you drive, park your car farther away from work or school.  If you take public transportation, get off one stop early and walk the rest of the way.  Make all of your phone calls while standing up and walking around.  Get up, stretch, and walk around every 30 minutes throughout the day. WHAT GUIDELINES SHOULD I FOLLOW WHILE EXERCISING?  Do not exercise so much that you hurt yourself, feel dizzy, or get very short of breath.  Consult your health care provider prior to starting a new exercise program.  Wear comfortable clothes and shoes with good support.  Drink plenty of water while you exercise to prevent dehydration or heat stroke. Body water is lost during exercise and must be replaced.  Work out until you breathe faster and your heart beats faster.   This information is not intended to replace advice given to you by your health care provider. Make sure you discuss any questions you have with your health care provider.   Document Released: 02/05/2010 Document Revised: 01/24/2014 Document Reviewed: 06/06/2013 Elsevier Interactive Patient Education 2016 [place] DASH stands for "Dietary Approaches to Stop Hypertension." The DASH eating plan is a healthy eating plan that has been shown to reduce high blood pressure (hypertension). Additional health benefits may include reducing  the risk of type 2 diabetes mellitus, heart disease, and stroke. The DASH eating plan may also help with weight loss. WHAT DO I NEED TO KNOW ABOUT THE DASH EATING PLAN? For the DASH eating plan, you will follow these general guidelines:  Choose foods with a percent daily value for sodium of less than 5% (as listed on the food label).  Use salt-free seasonings or herbs instead of table salt or sea salt.  Check with your health care provider or pharmacist before using salt substitutes.  Eat lower-sodium products, often labeled as "lower sodium" or "no salt added."  Eat fresh foods.  Eat more vegetables, fruits, and low-fat dairy products.  Choose whole grains. Look for the word "whole" as the first word in the ingredient list.  Choose fish and skinless chicken or [place] more often than red meat. Limit fish, poultry, and meat to 6 oz (170 g) each day.  Limit sweets, desserts, sugars, and sugary drinks.  Choose heart-healthy fats.  Limit cheese to 1 oz (28 g) per day.  Eat more home-cooked food and less restaurant, buffet, and fast food.  Limit fried foods.  Abigail Cruz  foods using methods other than frying.  Limit canned vegetables. If you do use them, rinse them well to decrease the sodium.  When eating at a restaurant, ask that your food be prepared with less salt, or no salt if possible. WHAT FOODS CAN I EAT? Seek help from a dietitian for individual calorie needs. Grains Whole grain or whole wheat bread. Brown rice. Whole grain or whole wheat pasta. Quinoa, bulgur, and whole grain cereals. Low-sodium cereals. Corn or whole wheat flour tortillas. Whole grain cornbread. Whole grain crackers. Low-sodium crackers. Vegetables Fresh or frozen vegetables (raw, steamed, roasted, or grilled). Low-sodium or reduced-sodium tomato and vegetable juices. Low-sodium or reduced-sodium tomato sauce and paste. Low-sodium or reduced-sodium canned vegetables.  Fruits All fresh, canned (in natural  juice), or frozen fruits. Meat and Other Protein Products Ground beef (85% or leaner), grass-fed beef, or beef trimmed of fat. Skinless chicken or [place]. Ground chicken or [place]. Pork trimmed of fat. All fish and seafood. Eggs. Dried beans, peas, or lentils. Unsalted nuts and seeds. Unsalted canned beans. Dairy Low-fat dairy products, such as skim or 1% milk, 2% or reduced-fat cheeses, low-fat ricotta or cottage cheese, or plain low-fat yogurt. Low-sodium or reduced-sodium cheeses. Fats and Oils Tub margarines without trans fats. Light or reduced-fat mayonnaise and salad dressings (reduced sodium). Avocado. Safflower, olive, or canola oils. Natural peanut or almond butter. Other Unsalted popcorn and pretzels. The items listed above may not be a complete list of recommended foods or beverages. Contact your dietitian for more options. WHAT FOODS ARE NOT RECOMMENDED? Grains White bread. White pasta. White rice. Refined cornbread. Bagels and croissants. Crackers that contain trans fat. Vegetables Creamed or fried vegetables. Vegetables in a cheese sauce. Regular canned vegetables. Regular canned tomato sauce and paste. Regular tomato and vegetable juices. Fruits Dried fruits. Canned fruit in light or heavy syrup. Fruit juice. Meat and Other Protein Products Fatty cuts of meat. Ribs, chicken wings, bacon, sausage, bologna, salami, chitterlings, fatback, hot dogs, bratwurst, and packaged luncheon meats. Salted nuts and seeds. Canned beans with salt. Dairy Whole or 2% milk, cream, half-and-half, and cream cheese. Whole-fat or sweetened yogurt. Full-fat cheeses or blue cheese. Nondairy creamers and whipped toppings. Processed cheese, cheese spreads, or cheese curds. Condiments Onion and garlic salt, seasoned salt, table salt, and sea salt. Canned and packaged gravies. Worcestershire sauce. Tartar sauce. Barbecue sauce. Teriyaki sauce. Soy sauce, including reduced sodium. Steak sauce. Fish sauce.  Oyster sauce. Cocktail sauce. Horseradish. Ketchup and mustard. Meat flavorings and tenderizers. Bouillon cubes. Hot sauce. Tabasco sauce. Marinades. Taco seasonings. Relishes. Fats and Oils Butter, stick margarine, lard, shortening, ghee, and bacon fat. Coconut, palm kernel, or palm oils. Regular salad dressings. Other Pickles and olives. Salted popcorn and pretzels. The items listed above may not be a complete list of foods and beverages to avoid. Contact your dietitian for more information. WHERE CAN I FIND MORE INFORMATION? National Heart, Lung, and Blood Institute: travelstabloid.com   This information is not intended to replace advice given to you by your health care provider. Make sure you discuss any questions you have with your health care provider.   Document Released: 12/23/2010 Document Revised: 01/24/2014 Document Reviewed: 11/07/2012 Elsevier Interactive Patient Education [employer].

## 2015-09-12 DIAGNOSIS — R030 Elevated blood-pressure reading, without diagnosis of hypertension: Secondary | ICD-10-CM | POA: Insufficient documentation

## 2015-09-12 NOTE — Assessment & Plan Note (Signed)
Counseling done; daily exercise, even of 15 minutes encouraged.

## 2015-09-12 NOTE — Assessment & Plan Note (Addendum)
Start with the singular first every night- take that for 2-3 weeks and then if her symptoms are still not well controlled, start the Symbicort daily.  She understands this is a steroid and needs to flush her mouth out after use.  Patient given a albuterol inhaler prescription just in case.  She knows how to use it.  We discussed her avoiding certain irritants and different preventative strategies.  Unfortunately she apparently just cannot as she travels a lot for work.

## 2015-09-12 NOTE — Assessment & Plan Note (Signed)
Dietary and lifestyle counseling done. Weight loss encouraged

## 2015-09-12 NOTE — Assessment & Plan Note (Signed)
Patient aware that Singulair will help with seasonal allergies- which is right upon [place].   - over-the-counter Allegra - Milta Deiters med sinus rinses twice a day  - environmental avoidance

## 2015-09-12 NOTE — Assessment & Plan Note (Signed)
Handouts provided on prudent diet, counseling done

## 2015-09-12 NOTE — Assessment & Plan Note (Addendum)
-   Education on hypertension provided - Patient encouraged to continue to monitor her blood pressure - DASH DIET/ Mediterranean diets d/c pt and handouts given.    - Encouraged dietary and lifestyle modification - she understands weight loss would help tremendously

## 2015-09-12 NOTE — Assessment & Plan Note (Addendum)
Continue supplementation and we can recheck 4-6 months

## 2015-10-19 ENCOUNTER — Ambulatory Visit: Payer: BLUE CROSS/BLUE SHIELD | Admitting: Family Medicine

## 2015-10-21 ENCOUNTER — Ambulatory Visit: Payer: BLUE CROSS/BLUE SHIELD | Admitting: Family Medicine

## 2015-11-10 ENCOUNTER — Encounter: Payer: Self-pay | Admitting: Family Medicine

## 2015-11-10 ENCOUNTER — Ambulatory Visit (INDEPENDENT_AMBULATORY_CARE_PROVIDER_SITE_OTHER): Payer: BLUE CROSS/BLUE SHIELD | Admitting: Family Medicine

## 2015-11-10 VITALS — BP 126/78 | HR 71 | Ht 61.5 in | Wt 339.0 lb

## 2015-11-10 DIAGNOSIS — R0683 Snoring: Secondary | ICD-10-CM | POA: Diagnosis not present

## 2015-11-10 DIAGNOSIS — R6889 Other general symptoms and signs: Secondary | ICD-10-CM

## 2015-11-10 DIAGNOSIS — F515 Nightmare disorder: Secondary | ICD-10-CM | POA: Insufficient documentation

## 2015-11-10 DIAGNOSIS — J989 Respiratory disorder, unspecified: Secondary | ICD-10-CM

## 2015-11-10 DIAGNOSIS — F518 Other sleep disorders not due to a substance or known physiological condition: Secondary | ICD-10-CM | POA: Diagnosis not present

## 2015-11-10 DIAGNOSIS — R0989 Other specified symptoms and signs involving the circulatory and respiratory systems: Secondary | ICD-10-CM

## 2015-11-10 DIAGNOSIS — Z6841 Body Mass Index (BMI) 40.0 and over, adult: Secondary | ICD-10-CM

## 2015-11-10 DIAGNOSIS — G4752 REM sleep behavior disorder: Secondary | ICD-10-CM

## 2015-11-10 DIAGNOSIS — J3089 Other allergic rhinitis: Secondary | ICD-10-CM

## 2015-11-10 NOTE — Progress Notes (Signed)
Impression and Recommendations:    1. Body mass index (bmi) 60.0-69.9, adult ([place])   2. Snoring   3. Dream enactment behavior   4. Vivid dream   5. Reactive airway disease that is allergen mediated   6. Environmental and seasonal allergies    - Referral to bariatric medicine- Dr Valetta Close - Pt prefers [place] location. Pt is open and willing to try meds / possibly Sx etc at this point. Pt with many Q/concerns today she wanted to discuss.  Doesn't want to do wt loss on her own- use lose it etc.   - Referral to sleep medicine. It's likely patient's hypercapnia, decreased oxygenation during the evenings that's causing her to have this reaction. We'll await sleep medicine's input.  - If you do not feel u need the Symbicort and Singulair, okay to stop.  Please remember, albuterol is to be used less than 3 times per week   Education and routine counseling performed. Handouts provided.  Pt was in the office today for 40+ minutes, with over 50% time spent in face to face counseling of various medical concerns and in coordination of care Discontinued Medications   BUDESONIDE-FORMOTEROL (SYMBICORT) 160-4.5 MCG/ACT INHALER    Inhale 2 puffs into the lungs 2 (two) times daily.   MONTELUKAST (SINGULAIR) 10 MG TABLET    Take 1 tablet (10 mg total) by mouth at bedtime.    Return in about 4 months (around 03/12/2016) for Follow-up of current medical issues.  The patient was counseled, risk factors were discussed, anticipatory guidance given.  Gross side effects, risk and benefits, and alternatives of medications discussed with patient.  Patient is aware that all medications have potential side effects and we are unable to predict every side effect or drug-drug interaction that may occur.  Expresses verbal understanding and consents to current therapy plan and treatment regimen.  Please see AVS handed out to patient at the end of our visit for further patient instructions/ counseling done pertaining to  today's office visit.    Note: This document was prepared using Dragon voice recognition software and may include unintentional dictation errors.     Subjective:    Chief Complaint  Patient presents with  . Hypertension  . Insomnia    HPI: Abigail Cruz is a 50 y.o. female who presents to [place] at [facility] today for follow up for MMP.   1. RAD-  pt stopped her singulair and symbicort we started last OV.  Said her sx weren't bad and didn't need it. Rarely ever uses albuterol.  2. Pt started dreaming really bad on Monday--> 8d ago-  Waking her up at night- couple times per night.  Very whacky dreams and scary- more than usual.   Moves and acts out dreams during sleep.  She is snoring,  + Daytime sleepiness.  No new meds/ supplements / OTC's- no changes to regimen/ work etc  3. Obese:  Interestingly as well, she has put on over 30 lbs in past 2 months-- no new stressors.  No new tensions at work/ home etc.    Pt walking more than usual.  Not sure if she is eating more or not.   STOP BANG: Snore:     + Tired:     + Observed stop breathing:  unknown Hypertension:   +  BMI >35:   + Age >50:   - Neck > 16 inches:  + Female gender:   - ------------------------------------------ Total:  5-6 total score=  High risk OSA   Patient Care Team    Relationship Specialty Notifications Start End  Mellody Dance, DO PCP - General Family Medicine  08/10/15      Wt Readings from Last 3 Encounters:  11/10/15 (!) 339 lb (153.8 kg)  09/07/15 (!) 311 lb (141.1 kg)  08/11/15 (!) 311 lb 3.2 oz (141.2 kg)   Temp Readings from Last 3 Encounters:  No data found for Temp   BP Readings from Last 3 Encounters:  11/10/15 126/78  09/07/15 120/86  08/11/15 (!) 152/106   Pulse Readings from Last 3 Encounters:  11/10/15 71  09/07/15 (!) 101  08/11/15 74     BMI Readings from Last 3 Encounters:  11/10/15 63.02 kg/m  09/07/15 57.81 kg/m  08/11/15 57.85 kg/m      Lab Results  Component Value Date   CREATININE 0.60 08/11/2015   BUN 13 08/11/2015   NA 137 08/11/2015   K 4.9 08/11/2015   CL 102 08/11/2015   CO2 25 08/11/2015    Lab Results  Component Value Date   CHOL 138 08/11/2015    Lab Results  Component Value Date   HDL 43 (L) 08/11/2015    Lab Results  Component Value Date   LDLCALC 65 08/11/2015    Lab Results  Component Value Date   TRIG 151 (H) 08/11/2015    Lab Results  Component Value Date   CHOLHDL 3.2 08/11/2015    No results found for: LDLDIRECT ===================================================================  Patient Active Problem List   Diagnosis Date Noted  . Bad dreams 11/10/2015  . Vivid dream 11/10/2015  . Snoring 11/10/2015  . 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 Borderline diastolic hypertension 99991111  . Hypertriglyceridemia without hypercholesterolemia 08/12/2015  . Low serum HDL 08/12/2015  . 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 Environmental and seasonal allergies 08/11/2015  . h/o Nephrolithiasis  08/11/2015  . H/O tubal ligation 08/11/2015  . Family history of diabetes mellitus 08/11/2015  . Family history of stroke 08/11/2015  . Family history of hypertension 08/11/2015  . Body mass index (bmi) 60.0-69.9, adult ([place]) 08/11/2015  . Vitamin D deficiency 08/11/2015    Past Medical History:  Diagnosis Date  . Chronic kidney disease    kidney stones  . Pneumonia     Past Surgical History:  Procedure Laterality Date  . CHOLECYSTECTOMY    . TUBAL LIGATION      Family History  Problem Relation Age of Onset  . Hypertension Mother   . Diabetes Mother   . Cancer Mother     cervical  . Hypertension Father   . Cancer Maternal Aunt     breast  . Cancer Paternal Aunt     breast  . Heart attack Paternal Grandfather   . Stroke Paternal Grandfather     History  Drug Use No  ,  History  Alcohol Use No  ,  History  Smoking Status  .  Never Smoker  Smokeless Tobacco  . Never Used  ,    Current Outpatient Prescriptions on File Prior to Visit  Medication Sig Dispense Refill  . albuterol (PROVENTIL HFA;VENTOLIN HFA) 108 (90 Base) MCG/ACT inhaler Only use as rescue inhaler, take 2 puffs if you feel acutely short of breath. 2 Inhaler 1  . Vitamin D, Ergocalciferol, (DRISDOL) 50000 units CAPS capsule Take 1 capsule (50,000 Units total) by mouth every 7 (seven) days. 12 capsule 3   No current facility-administered medications on  file prior to visit.     Allergies  Allergen Reactions  . Bee Venom Anaphylaxis  . Iodine Dermatitis and Rash  . Other Anaphylaxis    ANT bites  . Latex Other (See Comments)  . Shellfish-Derived Products Palpitations and Rash     Review of Systems  Constitutional: Negative.  Negative for chills, diaphoresis, fever, malaise/fatigue and weight loss.  HENT: Negative.  Negative for congestion, sore throat and tinnitus.   Eyes: Negative.  Negative for blurred vision, double vision and photophobia.  Respiratory: Negative.  Negative for cough and wheezing.   Cardiovascular: Negative.  Negative for chest pain and palpitations.  Gastrointestinal: Negative.  Negative for blood in stool, diarrhea, nausea and vomiting.  Genitourinary: Negative.  Negative for dysuria, frequency and urgency.  Musculoskeletal: Negative.  Negative for joint pain and myalgias.  Skin: Negative.  Negative for itching and rash.  Neurological: Negative.  Negative for dizziness, focal weakness, weakness and headaches.  Endo/Heme/Allergies: Negative.  Negative for environmental allergies and polydipsia. Does not bruise/bleed easily.  Psychiatric/Behavioral: Negative for depression and memory loss. The patient has insomnia. The patient is not nervous/anxious.     Objective:   Blood pressure 126/78, pulse 71, height 5' 1.5" (1.562 m), weight (!) 339 lb (153.8 kg), last menstrual period 11/06/2015. Body mass index is 63.02  kg/m. General: Developed, well nourished, and in no acute distress.  HEENT: Normocephalic, atraumatic, pupils equal round reactive to light, neck supple, No carotid bruits, no JVD Skin: Warm and dry, cap RF less 2 sec Cardiac: Regular rate and rhythm, S1, S2 WNL's, no murmurs rubs or gallops Respiratory: ECTA B/L, Not using accessory muscles, speaking in full sentences. NeuroM-Sk: Ambulates w/o assistance, moves ext * 4 w/o difficulty, sensation grossly intact.  Ext: scant edema b/l lower ext= non-pit Psych: No HI/SI, judgement and insight good, Euthymic mood. Full Affect.

## 2015-11-10 NOTE — Patient Instructions (Signed)
Sleep Apnea  Sleep apnea is a sleep disorder characterized by abnormal pauses in breathing while you sleep. When your breathing pauses, the level of oxygen in your blood decreases. This causes you to move out of deep sleep and into light sleep. As a result, your quality of sleep is poor, and the system that carries your blood throughout your body (cardiovascular system) experiences stress. If sleep apnea remains untreated, the following conditions can develop:  High blood pressure (hypertension).  Coronary artery disease.  Inability to achieve or maintain an erection (impotence).  Impairment of your thought process (cognitive dysfunction). There are three types of sleep apnea: 1. Obstructive sleep apnea--Pauses in breathing during sleep because of a blocked airway. 2. Central sleep apnea--Pauses in breathing during sleep because the area of the brain that controls your breathing does not send the correct signals to the muscles that control breathing. 3. Mixed sleep apnea--A combination of both obstructive and central sleep apnea. RISK FACTORS The following risk factors can increase your risk of developing sleep apnea:  Being overweight.  Smoking.  Having narrow passages in your nose and throat.  Being of older age.  Being female.  Alcohol use.  Sedative and tranquilizer use.  Ethnicity. Among individuals younger than 35 years, African Americans are at increased risk of sleep apnea. SYMPTOMS   Difficulty staying asleep.  Daytime sleepiness and fatigue.  Loss of energy.  Irritability.  Loud, heavy snoring.  Morning headaches.  Trouble concentrating.  Forgetfulness.  Decreased interest in sex.  Unexplained sleepiness. DIAGNOSIS  In order to diagnose sleep apnea, your caregiver will perform a physical examination. A sleep study done in the comfort of your own home may be appropriate if you are otherwise healthy. Your caregiver may also recommend that you spend the  night in a sleep lab. In the sleep lab, several monitors record information about your heart, lungs, and brain while you sleep. Your leg and arm movements and blood oxygen level are also recorded. TREATMENT The following actions may help to resolve mild sleep apnea:  Sleeping on your side.   Using a decongestant if you have nasal congestion.   Avoiding the use of depressants, including alcohol, sedatives, and narcotics.   Losing weight and modifying your diet if you are overweight. There also are devices and treatments to help open your airway:  Oral appliances. These are custom-made mouthpieces that shift your lower jaw forward and slightly open your bite. This opens your airway.  Devices that create positive airway pressure. This positive pressure "splints" your airway open to help you breathe better during sleep. The following devices create positive airway pressure:  Continuous positive airway pressure (CPAP) device. The CPAP device creates a continuous level of air pressure with an air pump. The air is delivered to your airway through a mask while you sleep. This continuous pressure keeps your airway open.  Nasal expiratory positive airway pressure (EPAP) device. The EPAP device creates positive air pressure as you exhale. The device consists of single-use valves, which are inserted into each nostril and held in place by adhesive. The valves create very little resistance when you inhale but create much more resistance when you exhale. That increased resistance creates the positive airway pressure. This positive pressure while you exhale keeps your airway open, making it easier to breath when you inhale again.  Bilevel positive airway pressure (BPAP) device. The BPAP device is used mainly in patients with central sleep apnea. This device is similar to the CPAP device because   it also uses an air pump to deliver continuous air pressure through a mask. However, with the BPAP machine, the  pressure is set at two different levels. The pressure when you exhale is lower than the pressure when you inhale.  Surgery. Typically, surgery is only done if you cannot comply with less invasive treatments or if the less invasive treatments do not improve your condition. Surgery involves removing excess tissue in your airway to create a wider passage way.   This information is not intended to replace advice given to you by your health care provider. Make sure you discuss any questions you have with your health care provider.   Document Released: 12/24/2001 Document Revised: 01/24/2014 Document Reviewed: 05/12/2011 Elsevier Interactive Patient Education 2016 [employer]   

## 2016-01-19 ENCOUNTER — Encounter: Payer: Self-pay | Admitting: Family Medicine

## 2016-01-19 ENCOUNTER — Ambulatory Visit (INDEPENDENT_AMBULATORY_CARE_PROVIDER_SITE_OTHER): Payer: BLUE CROSS/BLUE SHIELD | Admitting: Family Medicine

## 2016-01-19 VITALS — BP 147/79 | HR 82 | Temp 98.0°F | Ht 61.5 in | Wt 318.9 lb

## 2016-01-19 DIAGNOSIS — J069 Acute upper respiratory infection, unspecified: Secondary | ICD-10-CM | POA: Diagnosis not present

## 2016-01-19 DIAGNOSIS — H6983 Other specified disorders of Eustachian tube, bilateral: Secondary | ICD-10-CM

## 2016-01-19 DIAGNOSIS — B9789 Other viral agents as the cause of diseases classified elsewhere: Secondary | ICD-10-CM

## 2016-01-19 DIAGNOSIS — J329 Chronic sinusitis, unspecified: Secondary | ICD-10-CM

## 2016-01-19 MED ORDER — PREDNISONE 20 MG PO TABS: ORAL_TABLET | ORAL | 0 refills | Status: DC

## 2016-01-19 MED ORDER — AMOXICILLIN-POT CLAVULANATE 875-125 MG PO TABS: 1.0000 | ORAL_TABLET | Freq: Two times a day (BID) | ORAL | 0 refills | Status: DC

## 2016-01-19 MED ORDER — HYDROCOD POLST-CPM POLST ER 10-8 MG/5ML PO SUER: 5.0000 mL | Freq: Two times a day (BID) | ORAL | 0 refills | Status: DC | PRN

## 2016-01-19 NOTE — Progress Notes (Signed)
Acute Care Office visit  Assessment and plan:  1. Viral URI with cough   2. Sinusitis, unspecified chronicity, unspecified location   3. ETD (Eustachian tube dysfunction), bilateral-  L side W than R      Anticipatory guidance and routine counseling done re: condition, txmnt options and need for follow up. All questions of patient's were answered.  - Viral vs Allergic vs Bacterial causes for pt's symptoms reveiwed.    - Supportive care and various OTC medications discussed in addition to any prescribed. - Call or RTC if new symptoms, or if no improvement or worse over next couple days.   - Will consider ABX at that time if sx continue past 5-7 days and worsening.       New Prescriptions   AMOXICILLIN-CLAVULANATE (AUGMENTIN) 875-125 MG TABLET    Take 1 tablet by mouth 2 (two) times daily.   CHLORPHENIRAMINE-HYDROCODONE (TUSSIONEX) 10-8 MG/5ML SUER    Take 5 mLs by mouth every 12 (twelve) hours as needed for cough (cough, will cause drowsiness.).   HYDROCODONE-HOMATROPINE (HYCODAN) 5-1.5 MG/5ML SYRUP    Take 5 mLs by mouth every 8 (eight) hours as needed for cough.   PREDNISONE (DELTASONE) 20 MG TABLET    3 po qd * 7 days    Modified Medications   No medications on file    Discontinued Medications   No medications on file     Gross side effects, risk and benefits, and alternatives of medications discussed with patient.  Patient is aware that all medications have potential side effects and we are unable to predict every sideeffect or drug-drug interaction that may occur.  Expresses verbal understanding and consents to current therapy plan and treatment regiment.  Return if symptoms worsen or fail to improve, for Follow-up of current medical issues.  Please see AVS handed out to patient at the end of our visit for additional patient instructions/ counseling done pertaining to today's office visit.  Note: This document was prepared using Dragon voice recognition software  and may include unintentional dictation errors.    Subjective:    Chief Complaint  Patient presents with  . URI    HPI:  Pt presents with URI sx for  4-5 days ago- after being around a outdoor fire    C/o rhinorrhea, Sinuses congestion and cough PND.     no F/ + C--> at 100.0 orally max.   NO ear pain- feels clogged, No N/V/D, NO SOB/DIB, No Rash. Occ positional vertigo     taken anything for sx.  - alka seltzer meds and various other OTC meds.   Overall getting W.     Patient Active Problem List   Diagnosis Date Noted  . Bad dreams 11/10/2015  . Vivid dream 11/10/2015  . Snoring 11/10/2015  . 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 Borderline diastolic hypertension 99991111  . Hypertriglyceridemia without hypercholesterolemia 08/12/2015  . Low serum HDL 08/12/2015  . 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 Environmental and seasonal allergies 08/11/2015  . h/o Nephrolithiasis  08/11/2015  . H/O tubal ligation 08/11/2015  . Family history of diabetes mellitus 08/11/2015  . Family history of stroke 08/11/2015  . Family history of hypertension 08/11/2015  . Body mass index (bmi) 60.0-69.9, adult ([place]) 08/11/2015  . Vitamin D deficiency 08/11/2015    Past medical history, Surgical history, Family history reviewed and noted below, Social history, Allergies, and Medications have been entered into the medical record, reviewed and changed as  needed.   Allergies  Allergen Reactions  . Bee Venom Anaphylaxis  . Iodine Dermatitis and Rash  . Other Anaphylaxis    ANT bites  . Latex Other (See Comments)  . Shellfish-Derived Products Palpitations and Rash    Review of Systems  Constitutional: Positive for chills, diaphoresis, fever and malaise/fatigue.  HENT: Positive for congestion, ear pain, sinus pain and sore throat. Negative for ear discharge.   Eyes: Positive for discharge. Negative for pain.  Respiratory: Positive for wheezing. Negative for  cough, sputum production, shortness of breath and stridor.   Cardiovascular: Negative for chest pain.  Gastrointestinal: Negative for diarrhea, nausea and vomiting.  Musculoskeletal: Positive for myalgias. Negative for neck pain.  Skin: Negative for rash.  Neurological: Positive for dizziness and headaches.  Endo/Heme/Allergies: Positive for environmental allergies.  Psychiatric/Behavioral: The patient has insomnia.     Objective:   Blood pressure (!) 147/79, pulse 82, temperature 98 F (36.7 C), temperature source Oral, height 5' 1.5" (1.562 m), weight (!) 318 lb 14.4 oz (144.7 kg), last menstrual period 12/24/2015, SpO2 99 %. Body mass index is 59.28 kg/m. General: Well Developed, well nourished, appropriate for stated age.  Neuro: Alert and oriented x3, extra-ocular muscles intact, sensation grossly intact. Hallpikes negative HEENT: Normocephalic, atraumatic, pupils equal round reactive to light, neck supple, no masses, no painful lymphadenopathy, TM's intact B/L, no acute findings. Nares- patent, clear d/c, OP- clear, mild erythema, No TTP sinuses Skin: Warm and dry, no gross rash. Cardiac: RRR, S1 S2,  no murmurs rubs or gallops.  Respiratory: ECTA B/L and A/P, Not using accessory muscles, speaking in full sentences- unlabored. Vascular:  No gross lower ext edema, cap RF less 2 sec. Psych: No HI/SI, judgement and insight good, Euthymic mood. Full Affect.   Patient Care Team    Relationship Specialty Notifications Start End  Mellody Dance, DO PCP - General Family Medicine  08/10/15

## 2016-01-19 NOTE — Patient Instructions (Addendum)
You have a viral infection that should resolve on its own over time, or this could be a flare of seasonal allergies as well.   Symptoms for a viral upper respiratory infection usually last 3-7 days but can stretch out to 2-3 weeks before you're feeling back to normal.  Your symptoms should not worsen after 7-10 days and if they do, please notify our office, as you may need antibiotics.  You can use over-the-counter afrin nasal spray for up to 3 days (NO longer than that) which will help acutely with nasal drainage/congestion short term.   Also, sterile saline nasal rinses, such as Milta Deiters med or AYR sinus rinses, can be very helpful and should be done twice daily- even throughout the allergy season.  I would also like you to use an over the counter cold and flu medication such as Tylenol Severe Cold and Sinus or Dayquil/ Nyquil and the like which will help with cough, congestion, pain, and fevers/chills etc.  Unfortunately, antibiotics are not helpful for viral infections- hold off until 5-7 days and then take ABX if NI/W.  Wash your hands frequently, as you did not want to get those around you sick as well. Never sneeze or cough on others.  Drink plenty of fluids and stay hydrated, especially if you are running fevers.  We don't know why, but chicken soup also helps, try it! :)    If you get sx of vertigo--->   How to Treat Vertigo at Home with Exercises  What is Vertigo?  Vertigo is a relatively common symptom most often associated with conditions such as sinusitis (inflammation of your sinuses due to viruses, allergies, or bacterial infections), or an inner ear infection or ear trauma.   It can be brought on by trauma (e.g. a blow to the head or whiplash) or more serious things like minor strokes.   Symptoms can also be brought on by normal degenerative changes to your inner ear that occur with aging.  The condition tends to be more commonly seen in the elderly but it can occur in all ages.     Patients most often complain of dizziness, as if the room is spinning around them.   Symptoms are provoked by quick head movements or changes in position like going from standing to lying in bed, or even turning over in bed.   It may present with nausea and/or vomiting, and can be very debilitating to some folks.    By far the most common cause, known as Benign Paroxysmal Positional Vertigo (BPPV), is categorized by a sudden onset of symptoms, that are intense but short-lived (60 seconds or less), which is triggered by a change in head position.   Symptoms usually dissipate if you stay in one position and do not move your head.   Within the inner ear are collections of calcium carbonate crystals referred to as "otoliths" which may become dislodged from their normal position and migrate into the semicircular canals of the inner ear, throwing off your body's ability to sense where you are in space.     Fig. 921 Anatomy of the Right Osseous Labyrinth. Antonieta Iba. Anatomy of the Human Body. 1918.            What Else Could Be Behind My Vertigo?  Some other causes of vertigo include:  Meniere's disease (disorder of inner ear with ringing in ears, feeling of fullness/pressure within ear, and fluctuating hearing loss) Tumours Neurological disorders e.g. Multiple Sclerosis Motion Sickness (lack of  coordination between visual stimuli, inner ear balance and positional sense) Migraine Labyrinthitis (inflammation of the fluid-filled tubes and sacs within the inner ear; may also be associated with changes in hearing) Vestibular neuritis (inflammation of the nerves associated with transmission of sensory info from the inner ear; usually of viral origins)  How it can be treated/cured? While certain medications have been prescribed for vertigo including Lorazepam your doing well 7 house the house going organizing and getting things ready for sale with the and Meclizine (for motion sickness), there  exists no evidence to support a recommendation of any medication in the routine treatment of BPPV.  Clinical trials have demonstrated that repositioning techniques (listed below) are a superior option for management Otis Dials et al., 2008).    Figure above:  (A) Instructions for the modified Epley procedure (MEP) for left ear posterior canal benign paroxysmal positional vertigo (PC-BPPV). For right ear BPPV, the procedure has to be performed in the opposite direction, starting with the head turned to the right side.  1. Start by sitting on a bed with your head turned 45 to the left. Place a pillow behind you so that on lying back it will be under your shoulders.  2. Lie back quickly with shoulders on the pillow, neck extended, and head resting on the bed. In this position, the affected (left) ear is underneath. Wait for 30 secondS.  3. Turn your head 90 to the right (without raising it), and wait again for 30 seconds.  4. Turn your body and head another 90 to the right, and wait for another 30 seconds.  5. Sit up on the right side. This maneuver should be performed three times a day. Repeat this daily until you are free from positional vertigo for 24 hours.   (B) Instructions for the modified Semont maneuver (MSM) for left ear PC-BPPV. For right ear BPPV, the maneuver has to be performed in the opposite direction, starting with the head turned toward the left ear.  1. Sit upright on a bed with your head turned 45 toward the right ear.  2. Drop quickly to the left side, so that your head touches the bed behind your left ear. Wait 30 seconds.  3. Move head and trunk in a swift movement toward the other side without stopping in the upright position, so that your head comes to rest on the right side of your forehead. Wait again for 30 seconds.  4. Sit up again.  This maneuver should be performed three times a day. Repeat this daily until you are free from positional vertigo symptoms for 24 hours.   (    See the video in the supplementary material on the NeurologyWeb site; go to http://www.neurology.org/content/63/1/150/F1.expansion.html   )     You can also try this motion at home as well- Self-Treatment of Benign Paroxysmal Positional Vertigo Benign Paroxysmal Positioning Vertigo is caused by loose inner ear crystals in the inner ear that migrate while sleeping to the back-bottom inner ear balance canal, the so-called "posterior semi-circular canal." The maneuver demonstrated below is the way to reposition the loose crystals so that the symptoms caused by the loose crystals go away. You may have a floating, swaying sense while walking or sitting for a few days after this procedure.

## 2016-01-21 ENCOUNTER — Other Ambulatory Visit: Payer: Self-pay

## 2016-01-21 ENCOUNTER — Telehealth: Payer: Self-pay | Admitting: Family Medicine

## 2016-01-21 MED ORDER — HYDROCODONE-HOMATROPINE 5-1.5 MG/5ML PO SYRP: 5.0000 mL | ORAL_SOLUTION | Freq: Three times a day (TID) | ORAL | 0 refills | Status: DC | PRN

## 2016-01-21 NOTE — Telephone Encounter (Signed)
Pt husband cld states Abigail Cruz is having some itching/ rash with Tussionex and wants to change to Hydrocodone Homatropine- also pt need this handled ASAP as they are leaving this afternoon for out of town- Pls call pt when Rx is called in to pharmacy--glh

## 2016-01-21 NOTE — Telephone Encounter (Signed)
Pt has no swelling or no difficulty breathing. Pt states that tussionex is causing her to itch with no rash. Pt stated that she has taken Hydrocodone Homatropine before and did not cause her to itch. Prescription Hydrocodone Homatropine printed per Dr. Raliegh Scarlet. Printed Prescription is available for pt to pick up. Bobetta Lime CMA, RT

## 2016-03-03 ENCOUNTER — Ambulatory Visit: Payer: BLUE CROSS/BLUE SHIELD | Admitting: Cardiovascular Disease

## 2016-07-18 ENCOUNTER — Other Ambulatory Visit: Payer: Self-pay | Admitting: Family Medicine

## 2016-07-18 ENCOUNTER — Telehealth: Payer: Self-pay | Admitting: Family Medicine

## 2016-07-18 NOTE — Telephone Encounter (Signed)
Refill sent patient needs follow up appointment.

## 2016-07-18 NOTE — Telephone Encounter (Signed)
Patient is requesting a refill of her Vit D

## 2016-08-03 ENCOUNTER — Telehealth: Payer: Self-pay | Admitting: Family Medicine

## 2016-08-03 NOTE — Telephone Encounter (Signed)
Patient called stating that the Vit D refill needs to go through [facility] and not [facility], she wants to know if this can be resent out to the correct pharmacy

## 2016-08-03 NOTE — Telephone Encounter (Signed)
Spoke with patient husband that patient needs follow up appointment for further refills.

## 2016-08-10 ENCOUNTER — Ambulatory Visit: Payer: BLUE CROSS/BLUE SHIELD | Admitting: Family Medicine

## 2016-08-11 ENCOUNTER — Ambulatory Visit: Payer: BLUE CROSS/BLUE SHIELD | Admitting: Family Medicine

## 2016-09-29 ENCOUNTER — Telehealth: Payer: Self-pay | Admitting: Family Medicine

## 2016-09-29 ENCOUNTER — Other Ambulatory Visit: Payer: Self-pay

## 2016-09-29 DIAGNOSIS — R0989 Other specified symptoms and signs involving the circulatory and respiratory systems: Secondary | ICD-10-CM

## 2016-09-29 DIAGNOSIS — J989 Respiratory disorder, unspecified: Secondary | ICD-10-CM

## 2016-09-29 MED ORDER — ALBUTEROL SULFATE HFA 108 (90 BASE) MCG/ACT IN AERS: INHALATION_SPRAY | RESPIRATORY_TRACT | 1 refills | Status: DC

## 2016-09-29 NOTE — Telephone Encounter (Signed)
Patient is requesting a refill of the inhaler, sent to [facility]

## 2016-10-25 ENCOUNTER — Other Ambulatory Visit: Payer: Self-pay

## 2016-10-25 ENCOUNTER — Telehealth: Payer: Self-pay | Admitting: Family Medicine

## 2016-10-25 DIAGNOSIS — R0989 Other specified symptoms and signs involving the circulatory and respiratory systems: Secondary | ICD-10-CM

## 2016-10-25 DIAGNOSIS — J989 Respiratory disorder, unspecified: Secondary | ICD-10-CM

## 2016-10-25 NOTE — Telephone Encounter (Signed)
Patient's husband called requesting refill on Albuterol solution for nebulizer. Medication was last filled by a previous provider, sent to Dr. Raliegh Scarlet for review.  MPulliam, CMA/RT(R)

## 2016-10-25 NOTE — Telephone Encounter (Signed)
Patient needs refill on nebulizer solution that was last filled by previous provider.  Sent request to Dr. Raliegh Scarlet for review. MPulliam, CMA/RT(R)

## 2016-10-25 NOTE — Telephone Encounter (Signed)
Pt's husband cld states he needs to speak to med/ asst. about wife's medication.--advise they were with patient & would forward message to her/ Tonya for call back.--glh

## 2016-10-26 ENCOUNTER — Other Ambulatory Visit: Payer: Self-pay

## 2016-10-26 ENCOUNTER — Telehealth: Payer: Self-pay | Admitting: Family Medicine

## 2016-10-26 DIAGNOSIS — J989 Respiratory disorder, unspecified: Secondary | ICD-10-CM

## 2016-10-26 DIAGNOSIS — R0989 Other specified symptoms and signs involving the circulatory and respiratory systems: Secondary | ICD-10-CM

## 2016-10-26 MED ORDER — ALBUTEROL SULFATE (2.5 MG/3ML) 0.083% IN NEBU: 2.5000 mg | INHALATION_SOLUTION | Freq: Four times a day (QID) | RESPIRATORY_TRACT | 1 refills | Status: DC | PRN

## 2016-10-26 NOTE — Telephone Encounter (Signed)
Patient notified has appointment tomorrow for CPE.   Per Dr Raliegh Scarlet canceled out inhaler and sent in neb. Treatment. MPulliam, CMA/RT(R)

## 2016-10-26 NOTE — Telephone Encounter (Signed)
Please let patient know we've not seen her since January 2018.  She needs a follow-up office visit.   - In addition to the above the reason why we did not refill the the nebulizer prescription, she was given albuterol inhaler which is the exact same medicine in September of this year with a refill.  She should have plenty of albuterol to get her through acutely.  If she is needing additional albuterol I would most definitely would need to see her to assess the situation as well.

## 2016-10-26 NOTE — Telephone Encounter (Signed)
Spoke to patient's husband, patient needed solution for nebulizer and not the inhaler.  Spoke to Dr. Raliegh Scarlet and she approved sending in the solution.  Patient needs a follow up.  Patient notified. MPulliam, CMA/RT(R)

## 2016-10-27 ENCOUNTER — Ambulatory Visit (INDEPENDENT_AMBULATORY_CARE_PROVIDER_SITE_OTHER): Payer: BLUE CROSS/BLUE SHIELD | Admitting: Family Medicine

## 2016-10-27 ENCOUNTER — Encounter: Payer: Self-pay | Admitting: Family Medicine

## 2016-10-27 VITALS — BP 139/85 | HR 81 | Ht 61.5 in | Wt 311.1 lb

## 2016-10-27 DIAGNOSIS — Z0000 Encounter for general adult medical examination without abnormal findings: Secondary | ICD-10-CM | POA: Diagnosis not present

## 2016-10-27 NOTE — Progress Notes (Signed)
Impression and Recommendations:    No diagnosis found.  Please see orders section below for further details of actions taken during this office visit.  Gross side effects, risk and benefits, and alternatives of medications discussed with patient.  Patient is aware that all medications have potential side effects and we are unable to predict every side effect or drug-drug interaction that may occur.  Expresses verbal understanding and consents to current therapy plan and treatment regiment.  1) Anticipatory Guidance: Discussed importance of wearing a seatbelt while driving, not texting while driving; sunscreen when outside along with yearly skin surveillance; eating a well balanced and modest diet; physical activity at least 25 minutes per day or 150 min/ week of moderate to intense activity.  2) Immunizations / Screenings / Labs:  All immunizations and screenings that patient agrees to, are up-to-date per recommendations or will be updated today.  Patient understands the needs for q 90mo dental and yearly vision screens which pt will schedule independently. Obtain CBC, CMP, HgA1c, Lipid panel, TSH and vit D when fasting if not already done recently.   3) Weight:   Discussed goal of losing even 5-10% of current body weight which would improve overall feelings of well being and improve objective health data significantly.   Improve nutrient density of diet through increasing intake of fruits and vegetables and decreasing saturated/trans fats, white flour products and refined sugar products.   F-up preventative CPE in 1 year. F/up sooner for chronic care management as discussed and/or prn.  Please see orders placed and AVS handed out to patient at the end of our visit for further patient instructions/ counseling done pertaining to today's office visit.     Subjective:    Chief Complaint  Patient presents with  . Annual Exam   CC:   HPI: Abigail Cruz is a 51 y.o. female who  presents to [place] [facility] today a yearly health maintenance exam.  Health Maintenance Summary Reviewed and updated, unless pt declines services.  Aspirin: administering 81 mg daily Colonoscopy:     (Unnecessary secondary to < 74 or > 40 years old.) Tdap: Up to date: needs TD  Pneumovax/PPSV23:  Up to date: see Immunizations. Prevnar 13/PCV13:    UTD: needs  Zostavax:    Postponed. Tobacco History Reviewed:   Y  CT scan for screening lung CA:   N/A Abdominal Ultrasound:     ( Unnecessary secondary to < 22 or > 52 years old) Alcohol:    No concerns, no excessive use Exercise Habits:   Not meeting AHA guidelines STD concerns:   none Drug Use:   None Birth control method:   n/a Menses regular:     n/a Lumps or breast concerns:      no Breast Cancer Family History:      No Bone/ DEXA scan:    N/A ( Unnecessary due to < 65 and average risk)   Health Maintenance  Topic Date Due  . PAP SMEAR  07/04/1988  . INFLUENZA VACCINE  10/17/2017 (Originally 08/17/2016)  . TETANUS/TDAP  10/27/2017 (Originally 07/05/1986)  . HIV Screening  10/27/2017 (Originally 07/05/1982)     Wt Readings from Last 3 Encounters:  01/12/17 299 lb 14.4 oz (136 kg)  12/14/16 (!) 309 lb (140.2 kg)  11/15/16 (!) 315 lb 9.6 oz (143.2 kg)   BP Readings from Last 3 Encounters:  01/12/17 (!) 148/83  12/14/16 138/80  11/15/16 137/84   Pulse Readings from Last  3 Encounters:  01/12/17 77  12/14/16 74  11/15/16 89     Past Medical History:  Diagnosis Date  . Chronic kidney disease    kidney stones  . Pneumonia       Past Surgical History:  Procedure Laterality Date  . CHOLECYSTECTOMY    . TUBAL LIGATION        Family History  Problem Relation Age of Onset  . Hypertension Mother   . Diabetes Mother   . Cancer Mother        cervical  . Hypertension Father   . Cancer Maternal Aunt        breast  . Cancer Paternal Aunt        breast  . Heart attack Paternal Grandfather     . Stroke Paternal Grandfather       Social History   Substance and Sexual Activity  Drug Use No  ,   Social History   Substance and Sexual Activity  Alcohol Use No  ,   Social History   Tobacco Use  Smoking Status Never Smoker  Smokeless Tobacco Never Used  ,   Social History   Substance and Sexual Activity  Sexual Activity Yes  . Birth control/protection: Surgical    Current Outpatient Medications on File Prior to Visit  Medication Sig Dispense Refill  . albuterol (PROVENTIL) (2.5 MG/3ML) 0.083% nebulizer solution Take 3 mLs (2.5 mg total) by nebulization every 6 (six) hours as needed for wheezing or shortness of breath. 150 mL 1  . Cholecalciferol (VITAMIN D3) 5000 units CAPS Take 1 capsule by mouth daily.     No current facility-administered medications on file prior to visit.     Allergies: Bee venom; Iodine; Other; Latex; and Shellfish-derived products  Review of Systems: General:   Denies fever, chills, unexplained weight loss.  Optho/Auditory:   Denies visual changes, blurred vision/LOV Respiratory:   Denies SOB, DOE more than baseline levels.  Cardiovascular:   Denies chest pain, palpitations, new onset peripheral edema  Gastrointestinal:   Denies nausea, vomiting, diarrhea.  Genitourinary: Denies dysuria, freq/ urgency, flank pain or discharge from genitals.  Endocrine:     Denies hot or cold intolerance, polyuria, polydipsia. Musculoskeletal:   Denies unexplained myalgias, joint swelling, unexplained arthralgias, gait problems.  Skin:  Denies rash, suspicious lesions Neurological:     Denies dizziness, unexplained weakness, numbness  Psychiatric/Behavioral:   Denies mood changes, suicidal or homicidal ideations, hallucinations    Objective:    Blood pressure 139/85, pulse 81, height 5' 1.5" (1.562 m), weight (!) 311 lb 1.6 oz (141.1 kg), last menstrual period 10/26/2016. Body mass index is 57.83 kg/m. General Appearance:    Alert, cooperative,  no distress, appears stated age  Head:    Normocephalic, without obvious abnormality, atraumatic  Eyes:    PERRL, conjunctiva/corneas clear, EOM's intact, fundi    benign, both eyes  Ears:    Normal TM's and external ear canals, both ears  Nose:   Nares normal, septum midline, mucosa normal, no drainage    or sinus tenderness  Throat:   Lips w/o lesion, mucosa moist, and tongue normal; teeth and   gums normal  Neck:   Supple, symmetrical, trachea midline, no adenopathy;    thyroid:  no enlargement/tenderness/nodules; no carotid   bruit or JVD  Back:     Symmetric, no curvature, ROM normal, no CVA tenderness  Lungs:     Clear to auscultation bilaterally, respirations unlabored, no  Wh/ R/ R  Chest Wall:    No tenderness or gross deformity; normal excursion   Heart:    Regular rate and rhythm, S1 and S2 normal, no murmur, rub   or gallop  Breast Exam:    No tenderness, masses, or nipple abnormality b/l; no d/c  Abdomen:     Soft, non-tender, bowel sounds active all four quadrants, NO   G/R/R, no masses, no organomegaly  Genitalia:    Ext genitalia: without lesion, no rash or discharge, No         tenderness;  Cervix: WNL's w/o discharge or lesion;        Adnexa:  No tenderness or palpable masses   Rectal:    Normal tone, no masses or tenderness;   guaiac negative stool  Extremities:   Extremities normal, atraumatic, no cyanosis or gross edema  Pulses:   2+ and symmetric all extremities  Skin:   Warm, dry, Skin color, texture, turgor normal, no obvious rashes or lesions Psych: No HI/SI, judgement and insight good, Euthymic mood. Full Affect.  Neurologic:   CNII-XII intact, normal strength, sensation and reflexes    Throughout   DEXA:    [email or web address]   Based on the [place] FRAX tool, a 49 year old white woman with no other risk factors has a 9.3% 10-year risk for any osteoporotic fracture. White women between the ages of 57 and 61 years with equivalent or greater  10-year fracture risks based on specific risk factors include but are not limited to the following persons: 19) a 49 year old current smoker with a BMI less than 21 kg/m2, daily alcohol use, and parental fracture history; 70) a 49 year old woman with a parental fracture history; 14) a 49 year old woman with a BMI less than 21 kg/m2 and daily alcohol use; and 1) a 49 year old current smoker with daily alcohol use.   The FRAX tool also predicts 10-year fracture risks for black, Asian, and Hispanic women in the [place]. In general, estimated fracture risks in nonwhite women are lower than those for white women of the same age. Although the USPSTF recommends using a 9.3% 10-year fracture risk threshold to screen women aged 77 to 39 years,

## 2016-10-31 NOTE — Patient Instructions (Signed)
Preventive Care for Adults, Female  A healthy lifestyle and preventive care can promote health and wellness. Preventive health guidelines for women include the following key practices.   A routine yearly physical is a good way to check with your health care provider about your health and preventive screening. It is a chance to share any concerns and updates on your health and to receive a thorough exam.   Visit your dentist for a routine exam and preventive care every 6 months. Brush your teeth twice a day and floss once a day. Good oral hygiene prevents tooth decay and gum disease.   The frequency of eye exams is based on your age, health, family medical history, use of contact lenses, and other factors. Follow your health care provider's recommendations for frequency of eye exams.   Eat a healthy diet. Foods like vegetables, fruits, whole grains, low-fat dairy products, and lean protein foods contain the nutrients you need without too many calories. Decrease your intake of foods high in solid fats, added sugars, and salt. Eat the right amount of calories for you.Get information about a proper diet from your health care provider, if necessary.   Regular physical exercise is one of the most important things you can do for your health. Most adults should get at least 150 minutes of moderate-intensity exercise (any activity that increases your heart rate and causes you to sweat) each week. In addition, most adults need muscle-strengthening exercises on 2 or more days a week.   Maintain a healthy weight. The body mass index (BMI) is a screening tool to identify possible weight problems. It provides an estimate of body fat based on height and weight. Your health care provider can find your BMI, and can help you achieve or maintain a healthy weight.For adults 20 years and older:   - A BMI below 18.5 is considered underweight.   - A BMI of 18.5 to 24.9 is normal.   - A BMI of 25 to 29.9 is  considered overweight.   - A BMI of 30 and above is considered obese.   Maintain normal blood lipids and cholesterol levels by exercising and minimizing your intake of trans and saturated fats.  Eat a balanced diet with plenty of fruit and vegetables. Blood tests for lipids and cholesterol should begin at age 20 and be repeated every 5 years minimum.  If your lipid or cholesterol levels are high, you are over 40, or you are at high risk for heart disease, you may need your cholesterol levels checked more frequently.Ongoing high lipid and cholesterol levels should be treated with medicines if diet and exercise are not working.   If you smoke, find out from your health care provider how to quit. If you do not use tobacco, do not start.   Lung cancer screening is recommended for adults aged 55-80 years who are at high risk for developing lung cancer because of a history of smoking. A yearly low-dose CT scan of the lungs is recommended for people who have at least a 30-pack-year history of smoking and are a current smoker or have quit within the past 15 years. A pack year of smoking is smoking an average of 1 pack of cigarettes a day for 1 year (for example: 1 pack a day for 30 years or 2 packs a day for 15 years). Yearly screening should continue until the smoker has stopped smoking for at least 15 years. Yearly screening should be stopped for people who develop a   health problem that would prevent them from having lung cancer treatment.   If you are pregnant, do not drink alcohol. If you are breastfeeding, be very cautious about drinking alcohol. If you are not pregnant and choose to drink alcohol, do not have more than 1 drink per day. One drink is considered to be 12 ounces (355 mL) of beer, 5 ounces (148 mL) of wine, or 1.5 ounces (44 mL) of liquor.   Avoid use of street drugs. Do not share needles with anyone. Ask for help if you need support or instructions about stopping the use of  drugs.   High blood pressure causes heart disease and increases the risk of stroke. Your blood pressure should be checked at least yearly.  Ongoing high blood pressure should be treated with medicines if weight loss and exercise do not work.   If you are 69-55 years old, ask your health care provider if you should take aspirin to prevent strokes.   Diabetes screening involves taking a blood sample to check your fasting blood sugar level. This should be done once every 3 years, after age 38, if you are within normal weight and without risk factors for diabetes. Testing should be considered at a younger age or be carried out more frequently if you are overweight and have at least 1 risk factor for diabetes.   Breast cancer screening is essential preventive care for women. You should practice "breast self-awareness."  This means understanding the normal appearance and feel of your breasts and may include breast self-examination.  Any changes detected, no matter how small, should be reported to a health care provider.  Women in their 80s and 30s should have a clinical breast exam (CBE) by a health care provider as part of a regular health exam every 1 to 3 years.  After age 66, women should have a CBE every year.  Starting at age 1, women should consider having a mammogram (breast X-ray test) every year.  Women who have a family history of breast cancer should talk to their health care provider about genetic screening.  Women at a high risk of breast cancer should talk to their health care providers about having an MRI and a mammogram every year.   -Breast cancer gene (BRCA)-related cancer risk assessment is recommended for women who have family members with BRCA-related cancers. BRCA-related cancers include breast, ovarian, tubal, and peritoneal cancers. Having family members with these cancers may be associated with an increased risk for harmful changes (mutations) in the breast cancer genes BRCA1 and  BRCA2. Results of the assessment will determine the need for genetic counseling and BRCA1 and BRCA2 testing.   The Pap test is a screening test for cervical cancer. A Pap test can show cell changes on the cervix that might become cervical cancer if left untreated. A Pap test is a procedure in which cells are obtained and examined from the lower end of the uterus (cervix).   - Women should have a Pap test starting at age 57.   - Between ages 90 and 70, Pap tests should be repeated every 2 years.   - Beginning at age 63, you should have a Pap test every 3 years as long as the past 3 Pap tests have been normal.   - Some women have medical problems that increase the chance of getting cervical cancer. Talk to your health care provider about these problems. It is especially important to talk to your health care provider if a  new problem develops soon after your last Pap test. In these cases, your health care provider may recommend more frequent screening and Pap tests.   - The above recommendations are the same for women who have or have not gotten the vaccine for human papillomavirus (HPV).   - If you had a hysterectomy for a problem that was not cancer or a condition that could lead to cancer, then you no longer need Pap tests. Even if you no longer need a Pap test, a regular exam is a good idea to make sure no other problems are starting.   - If you are between ages 36 and 66 years, and you have had normal Pap tests going back 10 years, you no longer need Pap tests. Even if you no longer need a Pap test, a regular exam is a good idea to make sure no other problems are starting.   - If you have had past treatment for cervical cancer or a condition that could lead to cancer, you need Pap tests and screening for cancer for at least 20 years after your treatment.   - If Pap tests have been discontinued, risk factors (such as a new sexual partner) need to be reassessed to determine if screening should  be resumed.   - The HPV test is an additional test that may be used for cervical cancer screening. The HPV test looks for the virus that can cause the cell changes on the cervix. The cells collected during the Pap test can be tested for HPV. The HPV test could be used to screen women aged 70 years and older, and should be used in women of any age who have unclear Pap test results. After the age of 67, women should have HPV testing at the same frequency as a Pap test.   Colorectal cancer can be detected and often prevented. Most routine colorectal cancer screening begins at the age of 57 years and continues through age 26 years. However, your health care provider may recommend screening at an earlier age if you have risk factors for colon cancer. On a yearly basis, your health care provider may provide home test kits to check for hidden blood in the stool.  Use of a small camera at the end of a tube, to directly examine the colon (sigmoidoscopy or colonoscopy), can detect the earliest forms of colorectal cancer. Talk to your health care provider about this at age 23, when routine screening begins. Direct exam of the colon should be repeated every 5 -10 years through age 49 years, unless early forms of pre-cancerous polyps or small growths are found.   People who are at an increased risk for hepatitis B should be screened for this virus. You are considered at high risk for hepatitis B if:  -You were born in a country where hepatitis B occurs often. Talk with your health care provider about which countries are considered high risk.  - Your parents were born in a high-risk country and you have not received a shot to protect against hepatitis B (hepatitis B vaccine).  - You have HIV or AIDS.  - You use needles to inject street drugs.  - You live with, or have sex with, someone who has Hepatitis B.  - You get hemodialysis treatment.  - You take certain medicines for conditions like cancer, organ  transplantation, and autoimmune conditions.   Hepatitis C blood testing is recommended for all people born from [birth year] through 1965 and any individual  with known risks for hepatitis C.   Practice safe sex. Use condoms and avoid high-risk sexual practices to reduce the spread of sexually transmitted infections (STIs). STIs include gonorrhea, chlamydia, syphilis, trichomonas, herpes, HPV, and human immunodeficiency virus (HIV). Herpes, HIV, and HPV are viral illnesses that have no cure. They can result in disability, cancer, and death. Sexually active women aged 25 years and younger should be checked for chlamydia. Older women with new or multiple partners should also be tested for chlamydia. Testing for other STIs is recommended if you are sexually active and at increased risk.   Osteoporosis is a disease in which the bones lose minerals and strength with aging. This can result in serious bone fractures or breaks. The risk of osteoporosis can be identified using a bone density scan. Women ages 65 years and over and women at risk for fractures or osteoporosis should discuss screening with their health care providers. Ask your health care provider whether you should take a calcium supplement or vitamin D to There are also several preventive steps women can take to avoid osteoporosis and resulting fractures or to keep osteoporosis from worsening. -->Recommendations include:  Eat a balanced diet high in fruits, vegetables, calcium, and vitamins.  Get enough calcium. The recommended total intake of is 1,200 mg daily; for best absorption, if taking supplements, divide doses into 250-500 mg doses throughout the day. Of the two types of calcium, calcium carbonate is best absorbed when taken with food but calcium citrate can be taken on an empty stomach.  Get enough vitamin D. NAMS and the National Osteoporosis Foundation recommend at least 1,000 IU per day for women age 50 and over who are at risk of vitamin D  deficiency. Vitamin D deficiency can be caused by inadequate sun exposure (for example, those who live in northern latitudes).  Avoid alcohol and smoking. Heavy alcohol intake (more than 7 drinks per week) increases the risk of falls and hip fracture and women smokers tend to lose bone more rapidly and have lower bone mass than nonsmokers. Stopping smoking is one of the most important changes women can make to improve their health and decrease risk for disease.  Be physically active every day. Weight-bearing exercise (for example, fast walking, hiking, jogging, and weight training) may strengthen bones or slow the rate of bone loss that comes with aging. Balancing and muscle-strengthening exercises can reduce the risk of falling and fracture.  Consider therapeutic medications. Currently, several types of effective drugs are available. Healthcare providers can recommend the type most appropriate for each woman.  Eliminate environmental factors that may contribute to accidents. Falls cause nearly 90% of all osteoporotic fractures, so reducing this risk is an important bone-health strategy. Measures include ample lighting, removing obstructions to walking, using nonskid rugs on floors, and placing mats and/or grab bars in showers.  Be aware of medication side effects. Some common medicines make bones weaker. These include a type of steroid drug called glucocorticoids used for arthritis and asthma, some antiseizure drugs, certain sleeping pills, treatments for endometriosis, and some cancer drugs. An overactive thyroid gland or using too much thyroid hormone for an underactive thyroid can also be a problem. If you are taking these medicines, talk to your doctor about what you can do to help protect your bones.reduce the rate of osteoporosis.    Menopause can be associated with physical symptoms and risks. Hormone replacement therapy is available to decrease symptoms and risks. You should talk to your  health care provider   about whether hormone replacement therapy is right for you.   Use sunscreen. Apply sunscreen liberally and repeatedly throughout the day. You should seek shade when your shadow is shorter than you. Protect yourself by wearing long sleeves, pants, a wide-brimmed hat, and sunglasses year round, whenever you are outdoors.   Once a month, do a whole body skin exam, using a mirror to look at the skin on your back. Tell your health care provider of new moles, moles that have irregular borders, moles that are larger than a pencil eraser, or moles that have changed in shape or color.   -Stay current with required vaccines (immunizations).   Influenza vaccine. All adults should be immunized every year.  Tetanus, diphtheria, and acellular pertussis (Td, Tdap) vaccine. Pregnant women should receive 1 dose of Tdap vaccine during each pregnancy. The dose should be obtained regardless of the length of time since the last dose. Immunization is preferred during the 27th 36th week of gestation. An adult who has not previously received Tdap or who does not know her vaccine status should receive 1 dose of Tdap. This initial dose should be followed by tetanus and diphtheria toxoids (Td) booster doses every 10 years. Adults with an unknown or incomplete history of completing a 3-dose immunization series with Td-containing vaccines should begin or complete a primary immunization series including a Tdap dose. Adults should receive a Td booster every 10 years.  Varicella vaccine. An adult without evidence of immunity to varicella should receive 2 doses or a second dose if she has previously received 1 dose. Pregnant females who do not have evidence of immunity should receive the first dose after pregnancy. This first dose should be obtained before leaving the health care facility. The second dose should be obtained 4 8 weeks after the first dose.  Human papillomavirus (HPV) vaccine. Females aged 13 26  years who have not received the vaccine previously should obtain the 3-dose series. The vaccine is not recommended for use in pregnant females. However, pregnancy testing is not needed before receiving a dose. If a female is found to be pregnant after receiving a dose, no treatment is needed. In that case, the remaining doses should be delayed until after the pregnancy. Immunization is recommended for any person with an immunocompromised condition through the age of 26 years if she did not get any or all doses earlier. During the 3-dose series, the second dose should be obtained 4 8 weeks after the first dose. The third dose should be obtained 24 weeks after the first dose and 16 weeks after the second dose.  Zoster vaccine. One dose is recommended for adults aged 60 years or older unless certain conditions are present.  Measles, mumps, and rubella (MMR) vaccine. Adults born before 1957 generally are considered immune to measles and mumps. Adults born in 1957 or later should have 1 or more doses of MMR vaccine unless there is a contraindication to the vaccine or there is laboratory evidence of immunity to each of the three diseases. A routine second dose of MMR vaccine should be obtained at least 28 days after the first dose for students attending postsecondary schools, health care workers, or international travelers. People who received inactivated measles vaccine or an unknown type of measles vaccine during 1963 1967 should receive 2 doses of MMR vaccine. People who received inactivated mumps vaccine or an unknown type of mumps vaccine before 1979 and are at high risk for mumps infection should consider immunization with 2 doses of   MMR vaccine. For females of childbearing age, rubella immunity should be determined. If there is no evidence of immunity, females who are not pregnant should be vaccinated. If there is no evidence of immunity, females who are pregnant should delay immunization until after pregnancy.  Unvaccinated health care workers born before [birth year] who lack laboratory evidence of measles, mumps, or rubella immunity or laboratory confirmation of disease should consider measles and mumps immunization with 2 doses of MMR vaccine or rubella immunization with 1 dose of MMR vaccine.  Pneumococcal 13-valent conjugate (PCV13) vaccine. When indicated, a person who is uncertain of her immunization history and has no record of immunization should receive the PCV13 vaccine. An adult aged 54 years or older who has certain medical conditions and has not been previously immunized should receive 1 dose of PCV13 vaccine. This PCV13 should be followed with a dose of pneumococcal polysaccharide (PPSV23) vaccine. The PPSV23 vaccine dose should be obtained at least 8 weeks after the dose of PCV13 vaccine. An adult aged 58 years or older who has certain medical conditions and previously received 1 or more doses of PPSV23 vaccine should receive 1 dose of PCV13. The PCV13 vaccine dose should be obtained 1 or more years after the last PPSV23 vaccine dose.  Pneumococcal polysaccharide (PPSV23) vaccine. When PCV13 is also indicated, PCV13 should be obtained first. All adults aged 58 years and older should be immunized. An adult younger than age 65 years who has certain medical conditions should be immunized. Any person who resides in a nursing home or long-term care facility should be immunized. An adult smoker should be immunized. People with an immunocompromised condition and certain other conditions should receive both PCV13 and PPSV23 vaccines. People with human immunodeficiency virus (HIV) infection should be immunized as soon as possible after diagnosis. Immunization during chemotherapy or radiation therapy should be avoided. Routine use of PPSV23 vaccine is not recommended for American Indians, [place] Natives, or people younger than 65 years unless there are medical conditions that require PPSV23 vaccine. When indicated,  people who have unknown immunization and have no record of immunization should receive PPSV23 vaccine. One-time revaccination 5 years after the first dose of PPSV23 is recommended for people aged 70 64 years who have chronic kidney failure, nephrotic syndrome, asplenia, or immunocompromised conditions. People who received 1 2 doses of PPSV23 before age 32 years should receive another dose of PPSV23 vaccine at age 96 years or later if at least 5 years have passed since the previous dose. Doses of PPSV23 are not needed for people immunized with PPSV23 at or after age 55 years.  Meningococcal vaccine. Adults with asplenia or persistent complement component deficiencies should receive 2 doses of quadrivalent meningococcal conjugate (MenACWY-D) vaccine. The doses should be obtained at least 2 months apart. Microbiologists working with certain meningococcal bacteria, [employer] recruits, people at risk during an outbreak, and people who travel to or live in countries with a high rate of meningitis should be immunized. A first-year college student up through age 58 years who is living in a residence hall should receive a dose if she did not receive a dose on or after her 16th birthday. Adults who have certain high-risk conditions should receive one or more doses of vaccine.  Hepatitis A vaccine. Adults who wish to be protected from this disease, have certain high-risk conditions, work with hepatitis A-infected animals, work in hepatitis A research labs, or travel to or work in countries with a high rate of hepatitis A should be  immunized. Adults who were previously unvaccinated and who anticipate close contact with an international adoptee during the first 60 days after arrival in the [place] from a country with a high rate of hepatitis A should be immunized.  Hepatitis B vaccine.  Adults who wish to be protected from this disease, have certain high-risk conditions, may be exposed to blood or other infectious  body fluids, are household contacts or sex partners of hepatitis B positive people, are clients or workers in certain care facilities, or travel to or work in countries with a high rate of hepatitis B should be immunized.  Haemophilus influenzae type b (Hib) vaccine. A previously unvaccinated person with asplenia or sickle cell disease or having a scheduled splenectomy should receive 1 dose of Hib vaccine. Regardless of previous immunization, a recipient of a hematopoietic stem cell transplant should receive a 3-dose series 6 12 months after her successful transplant. Hib vaccine is not recommended for adults with HIV infection.  Preventive Services / Frequency Ages 6 to 39years  Blood pressure check.** / Every 1 to 2 years.  Lipid and cholesterol check.** / Every 5 years beginning at age 39.  Clinical breast exam.** / Every 3 years for women in their 61s and 62s.  BRCA-related cancer risk assessment.** / For women who have family members with a BRCA-related cancer (breast, ovarian, tubal, or peritoneal cancers).  Pap test.** / Every 2 years from ages 47 through 85. Every 3 years starting at age 34 through age 12 or 74 with a history of 3 consecutive normal Pap tests.  HPV screening.** / Every 3 years from ages 46 through ages 43 to 54 with a history of 3 consecutive normal Pap tests.  Hepatitis C blood test.** / For any individual with known risks for hepatitis C.  Skin self-exam. / Monthly.  Influenza vaccine. / Every year.  Tetanus, diphtheria, and acellular pertussis (Tdap, Td) vaccine.** / Consult your health care provider. Pregnant women should receive 1 dose of Tdap vaccine during each pregnancy. 1 dose of Td every 10 years.  Varicella vaccine.** / Consult your health care provider. Pregnant females who do not have evidence of immunity should receive the first dose after pregnancy.  HPV vaccine. / 3 doses over 6 months, if 64 and younger. The vaccine is not recommended for use in  pregnant females. However, pregnancy testing is not needed before receiving a dose.  Measles, mumps, rubella (MMR) vaccine.** / You need at least 1 dose of MMR if you were born in 1957 or later. You may also need a 2nd dose. For females of childbearing age, rubella immunity should be determined. If there is no evidence of immunity, females who are not pregnant should be vaccinated. If there is no evidence of immunity, females who are pregnant should delay immunization until after pregnancy.  Pneumococcal 13-valent conjugate (PCV13) vaccine.** / Consult your health care provider.  Pneumococcal polysaccharide (PPSV23) vaccine.** / 1 to 2 doses if you smoke cigarettes or if you have certain conditions.  Meningococcal vaccine.** / 1 dose if you are age 71 to 39 years and a Market researcher living in a residence hall, or have one of several medical conditions, you need to get vaccinated against meningococcal disease. You may also need additional booster doses.  Hepatitis A vaccine.** / Consult your health care provider.  Hepatitis B vaccine.** / Consult your health care provider.  Haemophilus influenzae type b (Hib) vaccine.** / Consult your health care provider.  Ages 55 to 64years  Blood pressure check.** / Every 1 to 2 years.  Lipid and cholesterol check.** / Every 5 years beginning at age 20 years.  Lung cancer screening. / Every year if you are aged 55 80 years and have a 30-pack-year history of smoking and currently smoke or have quit within the past 15 years. Yearly screening is stopped once you have quit smoking for at least 15 years or develop a health problem that would prevent you from having lung cancer treatment.  Clinical breast exam.** / Every year after age 40 years.  BRCA-related cancer risk assessment.** / For women who have family members with a BRCA-related cancer (breast, ovarian, tubal, or peritoneal cancers).  Mammogram.** / Every year beginning at age 40  years and continuing for as long as you are in good health. Consult with your health care provider.  Pap test.** / Every 3 years starting at age 30 years through age 65 or 70 years with a history of 3 consecutive normal Pap tests.  HPV screening.** / Every 3 years from ages 30 years through ages 65 to 70 years with a history of 3 consecutive normal Pap tests.  Fecal occult blood test (FOBT) of stool. / Every year beginning at age 50 years and continuing until age 75 years. You may not need to do this test if you get a colonoscopy every 10 years.  Flexible sigmoidoscopy or colonoscopy.** / Every 5 years for a flexible sigmoidoscopy or every 10 years for a colonoscopy beginning at age 50 years and continuing until age 75 years.  Hepatitis C blood test.** / For all people born from 1945 through 1965 and any individual with known risks for hepatitis C.  Skin self-exam. / Monthly.  Influenza vaccine. / Every year.  Tetanus, diphtheria, and acellular pertussis (Tdap/Td) vaccine.** / Consult your health care provider. Pregnant women should receive 1 dose of Tdap vaccine during each pregnancy. 1 dose of Td every 10 years.  Varicella vaccine.** / Consult your health care provider. Pregnant females who do not have evidence of immunity should receive the first dose after pregnancy.  Zoster vaccine.** / 1 dose for adults aged 60 years or older.  Measles, mumps, rubella (MMR) vaccine.** / You need at least 1 dose of MMR if you were born in 1957 or later. You may also need a 2nd dose. For females of childbearing age, rubella immunity should be determined. If there is no evidence of immunity, females who are not pregnant should be vaccinated. If there is no evidence of immunity, females who are pregnant should delay immunization until after pregnancy.  Pneumococcal 13-valent conjugate (PCV13) vaccine.** / Consult your health care provider.  Pneumococcal polysaccharide (PPSV23) vaccine.** / 1 to 2 doses if  you smoke cigarettes or if you have certain conditions.  Meningococcal vaccine.** / Consult your health care provider.  Hepatitis A vaccine.** / Consult your health care provider.  Hepatitis B vaccine.** / Consult your health care provider.  Haemophilus influenzae type b (Hib) vaccine.** / Consult your health care provider.  Ages 65 years and over  Blood pressure check.** / Every 1 to 2 years.  Lipid and cholesterol check.** / Every 5 years beginning at age 20 years.  Lung cancer screening. / Every year if you are aged 55 80 years and have a 30-pack-year history of smoking and currently smoke or have quit within the past 15 years. Yearly screening is stopped once you have quit smoking for at least 15 years or develop a health problem that   would prevent you from having lung cancer treatment.  Clinical breast exam.** / Every year after age 103 years.  BRCA-related cancer risk assessment.** / For women who have family members with a BRCA-related cancer (breast, ovarian, tubal, or peritoneal cancers).  Mammogram.** / Every year beginning at age 36 years and continuing for as long as you are in good health. Consult with your health care provider.  Pap test.** / Every 3 years starting at age 5 years through age 85 or 10 years with 3 consecutive normal Pap tests. Testing can be stopped between 65 and 70 years with 3 consecutive normal Pap tests and no abnormal Pap or HPV tests in the past 10 years.  HPV screening.** / Every 3 years from ages 93 years through ages 70 or 45 years with a history of 3 consecutive normal Pap tests. Testing can be stopped between 65 and 70 years with 3 consecutive normal Pap tests and no abnormal Pap or HPV tests in the past 10 years.  Fecal occult blood test (FOBT) of stool. / Every year beginning at age 8 years and continuing until age 45 years. You may not need to do this test if you get a colonoscopy every 10 years.  Flexible sigmoidoscopy or colonoscopy.** /  Every 5 years for a flexible sigmoidoscopy or every 10 years for a colonoscopy beginning at age 69 years and continuing until age 68 years.  Hepatitis C blood test.** / For all people born from [birth year] through 1963 and any individual with known risks for hepatitis C.  Osteoporosis screening.** / A one-time screening for women ages 7 years and over and women at risk for fractures or osteoporosis.  Skin self-exam. / Monthly.  Influenza vaccine. / Every year.  Tetanus, diphtheria, and acellular pertussis (Tdap/Td) vaccine.** / 1 dose of Td every 10 years.  Varicella vaccine.** / Consult your health care provider.  Zoster vaccine.** / 1 dose for adults aged 5 years or older.  Pneumococcal 13-valent conjugate (PCV13) vaccine.** / Consult your health care provider.  Pneumococcal polysaccharide (PPSV23) vaccine.** / 1 dose for all adults aged 74 years and older.  Meningococcal vaccine.** / Consult your health care provider.  Hepatitis A vaccine.** / Consult your health care provider.  Hepatitis B vaccine.** / Consult your health care provider.  Haemophilus influenzae type b (Hib) vaccine.** / Consult your health care provider. ** Family history and personal history of risk and conditions may change your health care provider's recommendations. Document Released: 03/01/2001 Document Revised: 10/24/2012  [facility] Patient Information 2014 [place], [place].   EXERCISE AND DIET:  We recommended that you start or continue a regular exercise program for good health. Regular exercise means any activity that makes your heart beat faster and makes you sweat.  We recommend exercising at least 30 minutes per day at least 3 days a week, preferably 5.  We also recommend a diet low in fat and sugar / carbohydrates.  Inactivity, poor dietary choices and obesity can cause diabetes, heart attack, stroke, and kidney damage, among others.     ALCOHOL AND SMOKING:  Women should limit their alcohol intake to no  more than 7 drinks/beers/glasses of wine (combined, not each!) per week. Moderation of alcohol intake to this level decreases your risk of breast cancer and liver damage.  ( And of course, no recreational drugs are part of a healthy lifestyle.)  Also, you should not be smoking at all or even being exposed to second hand smoke. Most people know smoking can  cause cancer, and various heart and lung diseases, but did you know it also contributes to weakening of your bones?  Aging of your skin?  Yellowing of your teeth and nails?   CALCIUM AND VITAMIN D:  Adequate intake of calcium and Vitamin D are recommended.  The recommendations for exact amounts of these supplements seem to change often, but generally speaking 600 mg of calcium (either carbonate or citrate) and 800 units of Vitamin D per day seems prudent. Certain women may benefit from higher intake of Vitamin D.  If you are among these women, your doctor will have told you during your visit.     PAP SMEARS:  Pap smears, to check for cervical cancer or precancers,  have traditionally been done yearly, although recent scientific advances have shown that most women can have pap smears less often.  However, every woman still should have a physical exam from her gynecologist or primary care physician every year. It will include a breast check, inspection of the vulva and vagina to check for abnormal growths or skin changes, a visual exam of the cervix, and then an exam to evaluate the size and shape of the uterus and ovaries.  And after 49 years of age, a rectal exam is indicated to check for rectal cancers. We will also provide age appropriate advice regarding health maintenance, like when you should have certain vaccines, screening for sexually transmitted diseases, bone density testing, colonoscopy, mammograms, etc.    MAMMOGRAMS:  All women over 71 years old should have a yearly mammogram. Many facilities now offer a "3D" mammogram, which may cost  around $50 extra out of pocket. If possible,  we recommend you accept the option to have the 3D mammogram performed.  It both reduces the number of women who will be called back for extra views which then turn out to be normal, and it is better than the routine mammogram at detecting truly abnormal areas.     COLONOSCOPY:  Colonoscopy to screen for colon cancer is recommended for all women at age 52.  We know, you hate the idea of the prep.  We agree, BUT, having colon cancer and not knowing it is worse!!  Colon cancer so often starts as a polyp that can be seen and removed at colonscopy, which can quite literally save your life!  And if your first colonoscopy is normal and you have no family history of colon cancer, most women don't have to have it again for 10 years.  Once every ten years, you can do something that may end up saving your life, right?  We will be happy to help you get it scheduled when you are ready.  Be sure to check your insurance coverage so you understand how much it will cost.  It may be covered as a preventative service at no cost, but you should check your particular policy.

## 2016-11-03 ENCOUNTER — Other Ambulatory Visit (INDEPENDENT_AMBULATORY_CARE_PROVIDER_SITE_OTHER): Payer: BLUE CROSS/BLUE SHIELD

## 2016-11-03 DIAGNOSIS — Z1211 Encounter for screening for malignant neoplasm of colon: Secondary | ICD-10-CM

## 2016-11-03 LAB — IFOBT (OCCULT BLOOD)
IFOBT: NEGATIVE
IFOBT: POSITIVE
IMMUNOLOGICAL FECAL OCCULT BLOOD TEST: POSITIVE

## 2016-11-07 ENCOUNTER — Other Ambulatory Visit: Payer: Self-pay

## 2016-11-07 DIAGNOSIS — E559 Vitamin D deficiency, unspecified: Secondary | ICD-10-CM

## 2016-11-07 DIAGNOSIS — Z833 Family history of diabetes mellitus: Secondary | ICD-10-CM

## 2016-11-07 DIAGNOSIS — Z0000 Encounter for general adult medical examination without abnormal findings: Secondary | ICD-10-CM

## 2016-11-07 DIAGNOSIS — E781 Pure hyperglyceridemia: Secondary | ICD-10-CM

## 2016-11-07 DIAGNOSIS — R748 Abnormal levels of other serum enzymes: Secondary | ICD-10-CM

## 2016-11-08 ENCOUNTER — Other Ambulatory Visit: Payer: BLUE CROSS/BLUE SHIELD

## 2016-11-08 DIAGNOSIS — E781 Pure hyperglyceridemia: Secondary | ICD-10-CM

## 2016-11-08 DIAGNOSIS — Z833 Family history of diabetes mellitus: Secondary | ICD-10-CM

## 2016-11-08 DIAGNOSIS — Z0000 Encounter for general adult medical examination without abnormal findings: Secondary | ICD-10-CM

## 2016-11-08 DIAGNOSIS — E559 Vitamin D deficiency, unspecified: Secondary | ICD-10-CM

## 2016-11-08 DIAGNOSIS — R748 Abnormal levels of other serum enzymes: Secondary | ICD-10-CM

## 2016-11-09 LAB — COMPREHENSIVE METABOLIC PANEL
A/G RATIO: 1.6 (ref 1.2–2.2)
ALBUMIN: 3.8 g/dL (ref 3.5–5.5)
ALT: 18 IU/L (ref 0–32)
AST: 12 IU/L (ref 0–40)
Alkaline Phosphatase: 83 IU/L (ref 39–117)
BUN/Creatinine Ratio: 17 (ref 9–23)
BUN: 9 mg/dL (ref 6–24)
Bilirubin Total: 0.3 mg/dL (ref 0.0–1.2)
CALCIUM: 8.7 mg/dL (ref 8.7–10.2)
CO2: 26 mmol/L (ref 20–29)
Chloride: 101 mmol/L (ref 96–106)
Creatinine, Ser: 0.52 mg/dL — ABNORMAL LOW (ref 0.57–1.00)
GFR, EST AFRICAN AMERICAN: 130 mL/min/{1.73_m2} (ref >59)
GFR, EST NON AFRICAN AMERICAN: 113 mL/min/{1.73_m2} (ref >59)
GLOBULIN, TOTAL: 2.4 g/dL (ref 1.5–4.5)
Glucose: 89 mg/dL (ref 65–99)
POTASSIUM: 4.8 mmol/L (ref 3.5–5.2)
SODIUM: 141 mmol/L (ref 134–144)
TOTAL PROTEIN: 6.2 g/dL (ref 6.0–8.5)

## 2016-11-09 LAB — LIPID PANEL
Chol/HDL Ratio: 3.3 ratio (ref 0.0–4.4)
Cholesterol, Total: 140 mg/dL (ref 100–199)
HDL: 43 mg/dL (ref >39)
LDL CALC: 72 mg/dL (ref 0–99)
Triglycerides: 124 mg/dL (ref 0–149)
VLDL CHOLESTEROL CAL: 25 mg/dL (ref 5–40)

## 2016-11-09 LAB — HEMOGLOBIN A1C
Est. average glucose Bld gHb Est-mCnc: 100 mg/dL
Hgb A1c MFr Bld: 5.1 % (ref 4.8–5.6)

## 2016-11-09 LAB — CBC WITH DIFFERENTIAL/PLATELET
BASOS: 1 %
Basophils Absolute: 0.1 10*3/uL (ref 0.0–0.2)
EOS (ABSOLUTE): 0.4 10*3/uL (ref 0.0–0.4)
EOS: 5 %
HEMATOCRIT: 38.9 % (ref 34.0–46.6)
Hemoglobin: 13.1 g/dL (ref 11.1–15.9)
IMMATURE GRANS (ABS): 0 10*3/uL (ref 0.0–0.1)
IMMATURE GRANULOCYTES: 0 %
Lymphocytes Absolute: 2 10*3/uL (ref 0.7–3.1)
Lymphs: 23 %
MCH: 27.8 pg (ref 26.6–33.0)
MCHC: 33.7 g/dL (ref 31.5–35.7)
MCV: 82 fL (ref 79–97)
MONOS ABS: 0.6 10*3/uL (ref 0.1–0.9)
Monocytes: 7 %
NEUTROS PCT: 64 %
Neutrophils Absolute: 5.8 10*3/uL (ref 1.4–7.0)
Platelets: 300 10*3/uL (ref 150–379)
RBC: 4.72 x10E6/uL (ref 3.77–5.28)
RDW: 14.3 % (ref 12.3–15.4)
WBC: 8.9 10*3/uL (ref 3.4–10.8)

## 2016-11-09 LAB — TSH: TSH: 1.99 u[IU]/mL (ref 0.450–4.500)

## 2016-11-09 LAB — VITAMIN D 25 HYDROXY (VIT D DEFICIENCY, FRACTURES): Vit D, 25-Hydroxy: 40.9 ng/mL (ref 30.0–100.0)

## 2016-11-15 ENCOUNTER — Ambulatory Visit (INDEPENDENT_AMBULATORY_CARE_PROVIDER_SITE_OTHER): Payer: BLUE CROSS/BLUE SHIELD | Admitting: Family Medicine

## 2016-11-15 ENCOUNTER — Encounter: Payer: Self-pay | Admitting: Family Medicine

## 2016-11-15 VITALS — BP 137/84 | HR 89 | Ht 61.5 in | Wt 315.6 lb

## 2016-11-15 DIAGNOSIS — E559 Vitamin D deficiency, unspecified: Secondary | ICD-10-CM | POA: Diagnosis not present

## 2016-11-15 DIAGNOSIS — E781 Pure hyperglyceridemia: Secondary | ICD-10-CM

## 2016-11-15 DIAGNOSIS — R748 Abnormal levels of other serum enzymes: Secondary | ICD-10-CM | POA: Diagnosis not present

## 2016-11-15 DIAGNOSIS — Z6841 Body Mass Index (BMI) 40.0 and over, adult: Secondary | ICD-10-CM | POA: Diagnosis not present

## 2016-11-15 MED ORDER — VITAMIN D (ERGOCALCIFEROL) 1.25 MG (50000 UNIT) PO CAPS: 50000.0000 [IU] | ORAL_CAPSULE | ORAL | 4 refills | Status: DC

## 2016-11-15 NOTE — Patient Instructions (Signed)
Please use lose it app over the next 4 weeks and track everything you put in your mouth.  Then we will get together and review your Please bring it in.      Behavior Modification Ideas for Weight Management  Weight management involves adopting a healthy lifestyle that includes a knowledge of nutrition and exercise, a positive attitude and the right kind of motivation. Internal motives such as better health, increased energy, self-esteem and personal control increase your chances of lifelong weight management success.  Remember to have realistic goals and think long-term success. Believe in yourself and you can do it. The following information will give you ideas to help you meet your goals.  Control Your Home Environment  Eat only while sitting down at the kitchen or dining room table. Do not eat while watching television, reading, cooking, talking on the phone, standing at the refrigerator or working on the computer. Keep tempting foods out of the house - don't buy them. Keep tempting foods out of sight. Have low-calorie foods ready to eat. Unless you are preparing a meal, stay out of the kitchen. Have healthy snacks at your disposal, such as small pieces of fruit, vegetables, canned fruit, pretzels, low-fat string cheese and nonfat cottage cheese.  Control Your Work Environment  Do not eat at [employer] or keep tempting snacks at your desk. If you get hungry between meals, plan healthy snacks and bring them with you to work. During your breaks, go for a walk instead of eating. If you work around food, plan in advance the one item you will eat at mealtime. Make it inconvenient to nibble on food by chewing gum, sugarless candy or drinking water or another low-calorie beverage. Do not work through meals. Skipping meals slows down metabolism and may result in overeating at the next meal. If food is available for special occasions, either pick the healthiest item, nibble on low-fat snacks  brought from home, don't have anything offered, choose one option and have a small amount, or have only a beverage.  Control Your Mealtime Environment  Serve your plate of food at the stove or kitchen counter. Do not put the serving dishes on the table. If you do put dishes on the table, remove them immediately when finished eating. Fill half of your plate with vegetables, a quarter with lean protein and a quarter with starch. Use smaller plates, bowls and glasses. A smaller portion will look large when it is in a little dish. Politely refuse second helpings. When fixing your plate, limit portions of food to one scoop/serving or less.   Daily Food Management  Replace eating with another activity that you will not associate with food. Wait 20 minutes before eating something you are craving. Drink a large glass of water or diet soda before eating. Always have a big glass or bottle of water to drink throughout the day. Avoid high-calorie add-ons such as cream with your coffee, butter, mayonnaise and salad dressings.  Shopping: Do not shop when hungry or tired. Shop from a list and avoid buying anything that is not on your list. If you must have tempting foods, buy individual-sized packages and try to find a lower-calorie alternative. Don't taste test in the store. Read food labels. Compare products to help you make the healthiest choices.  Preparation: Chew a piece of gum while cooking meals. Use a quarter teaspoon if you taste test your food. Try to only fix what you are going to eat, leaving yourself no chance  for seconds. If you have prepared more food than you need, portion it into individual containers and freeze or refrigerate immediately. Don't snack while cooking meals.  Eating: Eat slowly. Remember it takes about 20 minutes for your stomach to send a message to your brain that it is full. Don't let fake hunger make you think you need more. The ideal way to eat is to take a  bite, put your utensil down, take a sip of water, cut your next bite, take a bit, put your utensil down and so on. Do not cut your food all at one time. Cut only as needed. Take small bites and chew your food well. Stop eating for a minute or two at least once during a meal or snack. Take breaks to reflect and have conversation.  Cleanup and Leftovers: Label leftovers for a specific meal or snack. Freeze or refrigerate individual portions of leftovers. Do not clean up if you are still hungry.  Eating Out and Social Eating  Do not arrive hungry. Eat something light before the meal. Try to fill up on low-calorie foods, such as vegetables and fruit, and eat smaller portions of the high-calorie foods. Eat foods that you like, but choose small portions. If you want seconds, wait at least 20 minutes after you have eaten to see if you are actually hungry or if your eyes are bigger than your stomach. Limit alcoholic beverages. Try a soda water with a twist of lime. Do not skip other meals in the day to save room for the special event.  At Restaurants: Order  la carte rather than buffet style. Order some vegetables or a salad for an appetizer instead of eating bread. If you order a high-calorie dish, share it with someone. Try an after-dinner mint with your coffee. If you do have dessert, share it with two or more people. Don't overeat because you do not want to waste food. Ask for a doggie bag to take extra food home. Tell the server to put half of your entree in a to go bag before the meal is served to you. Ask for salad dressing, gravy or high-fat sauces on the side. Dip the tip of your fork in the dressing before each bite. If bread is served, ask for only one piece. Try it plain without butter or oil. At [employer] where oil and vinegar is served with bread, use only a small amount of oil and a lot of vinegar for dipping.  At a Friend's House: Offer to bring a dish, appetizer or  dessert that is low in calories. Serve yourself small portions or tell the host that you only want a small amount. Stand or sit away from the snack table. Stay away from the kitchen or stay busy if you are near the food. Limit your alcohol intake.  At [employer] and Cafeterias: Cover most of your plate with lettuce and/or vegetables. Use a salad plate instead of a dinner plate. After eating, clear away your dishes before having coffee or tea.  Entertaining at Home: Explore low-fat, low-cholesterol cookbooks. Use single-serving foods like chicken breasts or hamburger patties. Prepare low-calorie appetizers and desserts.   Holidays: Keep tempting foods out of sight. Decorate the house without using food. Have low-calorie beverages and foods on hand for guests. Allow yourself one planned treat a day. Don't skip meals to save up for the holiday feast. Eat regular, planned meals.   Exercise Well  Make exercise a priority and a planned activity in  the day. If possible, walk the entire or part of the distance to work. Get an exercise buddy. Go for a walk with a colleague during one of your breaks, go to the gym, run or take a walk with a friend, walk in the mall with a shopping companion. Park at the end of the parking lot and walk to the store or office entrance. Always take the stairs all of the way or at least part of the way to your floor. If you have a desk job, walk around the office frequently. Do leg lifts while sitting at your desk. Do something outside on the weekends like going for a hike or a bike ride.   Have a Healthy Attitude  Make health your weight management priority. Be realistic. Have a goal to achieve a healthier you, not necessarily the lowest weight or ideal weight based on calculations or tables. Focus on a healthy eating style, not on dieting. Dieting usually lasts for a short amount of time and rarely produces long-term success. Think long term. You are  developing new healthy behaviors to follow next month, in a year and in a decade.    This information is for educational purposes only and is not intended to replace the advice of your doctor or health care provider. We encourage you to discuss with your doctor any questions or concerns you may have.        Guidelines for Losing Weight   We want weight loss that will last so you should lose 1-2 pounds a week.  THAT IS IT! Please pick THREE things a month to change. Once it is a habit check off the item. Then pick another three items off the list to become habits.  If you are already doing a habit on the list GREAT!  Cross that item off!  Don't drink your calories. Ie, alcohol, soda, fruit juice, and sweet tea.   Drink more water. Drink a glass when you feel hungry or before each meal.   Eat breakfast - Complex carb and protein (likeDannon light and fit yogurt, oatmeal, fruit, eggs, [place] bacon).  Measure your cereal.  Eat no more than one cup a day. (ie Kashi)  Eat an apple a day.  Add a vegetable a day.  Try a new vegetable a month.  Use Pam! Stop using oil or butter to cook.  Don't finish your plate or use smaller plates.  Share your dessert.  Eat sugar free Jello for dessert or frozen grapes.  Don't eat 2-3 hours before bed.  Switch to whole wheat bread, pasta, and brown rice.  Make healthier choices when you eat out. No fries!  Pick baked chicken, NOT fried.  Don't forget to SLOW DOWN when you eat. It is not going anywhere.   Take the stairs.  Park far away in the parking lot  Lift soup cans (or weights) for 10 minutes while watching TV.  Walk at work for 10 minutes during break.  Walk outside 1 time a week with your friend, kids, dog, or significant other.  Start a walking group at church.  Walk the mall as much as you can tolerate.   Keep a food diary.  Weigh yourself daily.  Walk for 15 minutes 3 days per week.  Cook at home more often and  eat out less. If life happens and you go back to old habits, it is okay.  Just start over. You can do it!  If you experience chest pain,  get short of breath, or tired during the exercise, please stop immediately and inform your doctor.    Before you even begin to attack a weight-loss plan, it pays to remember this: You are not fat. You have fat. Losing weight isn't about blame or shame; it's simply another achievement to accomplish. Dieting is like any other skill-you have to buckle down and work at it. As long as you act in a smart, reasonable way, you'll ultimately get where you want to be. Here are some weight loss pearls for you.   1. It's Not a Diet. It's a Lifestyle Thinking of a diet as something you're on and suffering through only for the short term doesn't work. To shed weight and keep it off, you need to make permanent changes to the way you eat. It's OK to indulge occasionally, of course, but if you cut calories temporarily and then revert to your old way of eating, you'll gain back the weight quicker than you can say yo-yo. Use it to lose it. Research shows that one of the best predictors of long-term weight loss is how many pounds you drop in the first month. For that reason, nutritionists often suggest being stricter for the first two weeks of your new eating strategy to build momentum. Cut out added sugar and alcohol and avoid unrefined carbs. After that, figure out how you can reincorporate them in a way that's healthy and maintainable.  2. There's a Right Way to Exercise Working out burns calories and fat and boosts your metabolism by building muscle. But those trying to lose weight are notorious for overestimating the number of calories they burn and underestimating the amount they take in. Unfortunately, your system is biologically programmed to hold on to extra pounds and that means when you start exercising, your body senses the deficit and ramps up its hunger signals. If you're not  diligent, you'll eat everything you burn and then some. Use it, to lose it. Cardio gets all the exercise glory, but strength and interval training are the real heroes. They help you build lean muscle, which in turn increases your metabolism and calorie-burning ability 3. Don't Overreact to Mild Hunger Some people have a hard time losing weight because of hunger anxiety. To them, being hungry is bad-something to be avoided at all costs-so they carry snacks with them and eat when they don't need to. Others eat because they're stressed out or bored. While you never want to get to the point of being ravenous (that's when bingeing is likely to happen), a hunger pang, a craving, or the fact that it's 3:00 p.m. should not send you racing for the vending machine or obsessing about the energy bar in your purse. Ideally, you should put off eating until your stomach is growling and it's difficult to concentrate.  Use it to lose it. When you feel the urge to eat, use the HALT method. Ask yourself, Am I really hungry? Or am I angry or anxious, lonely or bored, or tired? If you're still not certain, try the apple test. If you're truly hungry, an apple should seem delicious; if it doesn't, something else is going on. Or you can try drinking water and making yourself busy, if you are still hungry try a healthy snack.  4. Not All Calories Are Created Equal The mechanics of weight loss are pretty simple: Take in fewer calories than you use for energy. But the kind of food you eat makes all the difference. Processed food  that's high in saturated fat and refined starch or sugar can cause inflammation that disrupts the hormone signals that tell your brain you're full. The result: You eat a lot more.  Use it to lose it. Clean up your diet. Swap in whole, unprocessed foods, including vegetables, lean protein, and healthy fats that will fill you up and give you the biggest nutritional bang for your calorie buck. In a few weeks, as  your brain starts receiving regular hunger and fullness signals once again, you'll notice that you feel less hungry overall and naturally start cutting back on the amount you eat.  5. Protein, Produce, and Plant-Based Fats Are Your Weight-Loss Trinity Here's why eating the three Ps regularly will help you drop pounds. Protein fills you up. You need it to build lean muscle, which keeps your metabolism humming so that you can torch more fat. People in a weight-loss program who ate double the recommended daily allowance for protein (about 110 grams for a 150-pound woman) lost 70 percent of their weight from fat, while people who ate the RDA lost only about 40 percent, one study found. Produce is packed with filling fiber. "It's very difficult to consume too many calories if you're eating a lot of vegetables. Example: Three cups of broccoli is a lot of food, yet only 93 calories. (Fruit is another story. It can be easy to overeat and can contain a lot of calories from sugar, so be sure to monitor your intake.) Plant-based fats like olive oil and those in avocados and nuts are healthy and extra satiating.  Use it to lose it. Aim to incorporate each of the three Ps into every meal and snack. People who eat protein throughout the day are able to keep weight off, according to a study in the [place] of Clinical Nutrition. In addition to meat, poultry and seafood, good sources are beans, lentils, eggs, tofu, and yogurt. As for fat, keep portion sizes in check by measuring out salad dressing, oil, and nut butters (shoot for one to two tablespoons). Finally, eat veggies or a little fruit at every meal. People who did that consumed 308 fewer calories but didn't feel any hungrier than when they didn't eat more produce.  7. How You Eat Is As Important As What You Eat In order for your brain to register that you're full, you need to focus on what you're eating. Sit down whenever you eat, preferably at a table.  Turn off the TV or computer, put down your phone, and look at your food. Smell it. Chew slowly, and don't put another bite on your fork until you swallow. When women ate lunch this attentively, they consumed 30 percent less when snacking later than those who listened to an audiobook at lunchtime, according to a study in the [place] of Nutrition. 8. Weighing Yourself Really Works The scale provides the best evidence about whether your efforts are paying off. Seeing the numbers tick up or down or stagnate is motivation to keep going-or to rethink your approach. A 2015 study at [facility] found that daily weigh-ins helped people lose more weight, keep it off, and maintain that loss, even after two years. Use it to lose it. Step on the scale at the same time every day for the best results. If your weight shoots up several pounds from one weigh-in to the next, don't freak out. Eating a lot of salt the night before or having your period is the likely culprit. The number  should return to normal in a day or two. It's a steady climb that you need to do something about. 9. Too Much Stress and Too Little Sleep Are Your Enemies When you're tired and frazzled, your body cranks up the production of cortisol, the stress hormone that can cause carb cravings. Not getting enough sleep also boosts your levels of ghrelin, a hormone associated with hunger, while suppressing leptin, a hormone that signals fullness and satiety. People on a diet who slept only five and a half hours a night for two weeks lost 55 percent less fat and were hungrier than those who slept eight and a half hours, according to a study in the [place]. Use it to lose it. Prioritize sleep, aiming for seven hours or more a night, which research shows helps lower stress. And make sure you're getting quality zzz's. If a snoring spouse or a fidgety cat wakes you up frequently throughout the night, you may end up getting  the equivalent of just four hours of sleep, according to a study from [facility]. Keep pets out of the bedroom, and use a white-noise app to drown out snoring. 10. You Will Hit a plateau-And You Can Bust Through It As you slim down, your body releases much less leptin, the fullness hormone.  If you're not strength training, start right now. Building muscle can raise your metabolism to help you overcome a plateau. To keep your body challenged and burning calories, incorporate new moves and more intense intervals into your workouts or add another sweat session to your weekly routine. Alternatively, cut an extra 100 calories or so a day from your diet. Now that you've lost weight, your body simply doesn't need as much fuel.    Since food equals calories, in order to lose weight you must either eat fewer calories, exercise more to burn off calories with activity, or both. Food that is not used to fuel the body is stored as fat. A major component of losing weight is to make smarter food choices. Here's how:  1)   Limit non-nutritious foods, such as: Sugar, honey, syrups and candy Pastries, donuts, pies, cakes and cookies Soft drinks, sweetened juices and alcoholic beverages  2)  Cut down on high-fat foods by: - Choosing poultry, fish or lean red meat - Choosing low-fat cooking methods, such as baking, broiling, steaming, grilling and boiling - Using low-fat or non-fat dairy products - Using vinaigrette, herbs, lemon or fat-free salad dressings - Avoiding fatty meats, such as bacon, sausage, franks, ribs and luncheon meats - Avoiding high-fat snacks like nuts, chips and chocolate - Avoiding fried foods - Using less butter, margarine, oil and mayonnaise - Avoiding high-fat gravies, cream sauces and cream-based soups  3) Eat a variety of foods, including: - Fruit and vegetables that are raw, steamed or baked - Whole grains, breads, cereal, rice and pasta - Dairy products, such as low-fat  or non-fat milk or yogurt, low-fat cottage cheese and low-fat cheese - Protein-rich foods like chicken, [place], fish, lean meat and legumes, or beans  4) Change your eating habits by: - Eat three balanced meals a day to help control your hunger - Watch portion sizes and eat small servings of a variety of foods - Choose low-calorie snacks - Eat only when you are hungry and stop when you are satisfied - Eat slowly and try not to perform other tasks while eating - Find other activities to distract you from food, such as walking,  taking up a hobby or being involved in the community - Include regular exercise in your daily routine ( minimum of 20 min of moderate-intensity exercise at least 5 days/week)  - Find a support group, if necessary, for emotional support in your weight loss journey           Easy ways to cut 100 calories   1. Eat your eggs with hot sauce OR salsa instead of cheese.  Eggs are great for breakfast, but many people consider eggs and cheese to be BFFs. Instead of cheese-1 oz. of cheddar has 114 calories-top your eggs with hot sauce, which contains no calories and helps with satiety and metabolism. Salsa is also a great option!!  2. Top your toast, waffles or pancakes with fresh berries instead of jelly or syrup. Half a cup of berries-fresh, frozen or thawed-has about 40 calories, compared with 2 tbsp. of maple syrup or jelly, which both have about 100 calories. The berries will also give you a good punch of fiber, which helps keep you full and satisfied and won't spike blood sugar quickly like the jelly or syrup. 3. Swap the non-fat latte for black coffee with a splash of half-and-half. Contrary to its name, that non-fat latte has 130 calories and a startling 19g of carbohydrates per 16 oz. serving. Replacing that 'light' drinkable dessert with a black coffee with a splash of half-and-half saves you more than 100 calories per 16 oz. serving. 4. Sprinkle salads with  freeze-dried raspberries instead of dried cranberries. If you want a sweet addition to your nutritious salad, stay away from dried cranberries. They have a whopping 130 calories per  cup and 30g carbohydrates. Instead, sprinkle freeze-dried raspberries guilt-free and save more than 100 calories per  cup serving, adding 3g of belly-filling fiber. 5. Go for mustard in place of mayo on your sandwich. Mustard can add really nice flavor to any sandwich, and there are tons of varieties, from spicy to honey. A serving of mayo is 95 calories, versus 10 calories in a serving of mustard.  Or try an avocado mayo spread: You can find the recipe few click this link: https://www.californiaavocado.com/recipes/recipe-container/[place]-avocado-mayo 6. Choose a DIY salad dressing instead of the store-bought kind. Mix Dijon or whole grain mustard with low-fat Kefir or red wine vinegar and garlic. 7. Use hummus as a spread instead of a dip. Use hummus as a spread on a high-fiber cracker or tortilla with a sandwich and save on calories without sacrificing taste. 8. Pick just one salad "accessory." Salad isn't automatically a calorie winner. It's easy to over-accessorize with toppings. Instead of topping your salad with nuts, avocado and cranberries (all three will clock in at 313 calories), just pick one. The next day, choose a different accessory, which will also keep your salad interesting. You don't wear all your jewelry every day, right? 9. Ditch the white pasta in favor of spaghetti squash. One cup of cooked spaghetti squash has about 40 calories, compared with traditional spaghetti, which comes with more than 200. Spaghetti squash is also nutrient-dense. It's a good source of fiber and Vitamins A and C, and it can be eaten just like you would eat pasta-with a great tomato sauce and [place] meatballs or with pesto, tofu and spinach, for example. 10. Dress up your chili, soups and stews with non-fat [place] yogurt  instead of sour cream. Just a 'dollop' of sour cream can set you back 115 calories and a whopping 12g of fat-seven of which are of  the artery-clogging variety. Added bonus: [place] yogurt is packed with muscle-building protein, calcium and B Vitamins. 11. Mash cauliflower instead of mashed potatoes. One cup of traditional mashed potatoes-in all their creamy goodness-has more than 200 calories, compared to mashed cauliflower, which you can typically eat for less than 100 calories per 1 cup serving. Cauliflower is a great source of the antioxidant indole-3-carbinol (I3C), which may help reduce the risk of some cancers, like breast cancer. 12. Ditch the ice cream sundae in favor of a [place] yogurt parfait. Instead of a cup of ice cream or fro-yo for dessert, try 1 cup of nonfat Greek yogurt topped with fresh berries and a sprinkle of cacao nibs. Both toppings are packed with antioxidants, which can help reduce cellular inflammation and oxidative damage. And the comparison is a no-brainer: One cup of ice cream has about 275 calories; one cup of frozen yogurt has about 230; and a cup of Greek yogurt has just 130, plus twice the protein, so you're less likely to return to the freezer for a second helping. 13. Put olive oil in a spray container instead of using it directly from the bottle. Each tablespoon of olive oil is 120 calories and 15g of fat. Use a mister instead of pouring it straight into the pan or onto a salad. This allows for portion control and will save you more than 100 calories. 14. When baking, substitute canned pumpkin for butter or oil. Canned pumpkin-not pumpkin pie mix-is loaded with Vitamin A, which is important for skin and eye health, as well as immunity. And the comparisons are pretty crazy:  cup of canned pumpkin has about 40 calories, compared to butter or oil, which has more than 800 calories. Yes, 800 calories. Applesauce and mashed banana can also serve as good substitutions for butter  or oil, usually in a 1:1 ratio. 15. Top casseroles with high-fiber cereal instead of breadcrumbs. Breadcrumbs are typically made with white bread, while breakfast cereals contain 5-9g of fiber per serving. Not only will you save more than 150 calories per  cup serving, the swap will also keep you more full and you'll get a metabolism boost from the added fiber. 16. Snack on pistachios instead of macadamia nuts. Believe it or not, you get the same amount of calories from 35 pistachios (100 calories) as you would from only five macadamia nuts. 17. Chow down on kale chips rather than potato chips. This is my favorite 'don't knock it 'till you try it' swap. Kale chips are so easy to make at home, and you can spice them up with a little grated parmesan or chili powder. Plus, they're a mere fraction of the calories of potato chips, but with the same crunch factor we crave so often. 18. Add seltzer and some fruit slices to your cocktail instead of soda or fruit juice. One cup of soda or fruit juice can pack on as much as 140 calories. Instead, use seltzer and fruit slices. The fruit provides valuable phytochemicals, such as flavonoids and anthocyanins, which help to combat cancer and stave off the aging process.

## 2016-11-15 NOTE — Progress Notes (Signed)
Assessment and plan:  1. Vitamin D deficiency   2. Hypertriglyceridemia without hypercholesterolemia   3. Low serum HDL   4. Body mass index (bmi) 60.0-69.9, adult (HCC)    -Continue supplementation -Continue dietary and lifestyle modification to better control cholesterol -Encouraged increase activity levels -Extensive counseling done regarding weight loss and dietary approaches.  Meds ordered this encounter  Medications  . Vitamin D, Ergocalciferol, (DRISDOL) 50000 units CAPS capsule    Sig: Take 1 capsule (50,000 Units total) by mouth 2 (two) times a week.    Dispense:  24 capsule    Refill:  4    Needs appt    Discontinued Medications   No medications on file    Modified Medications   Modified Medication Previous Medication   PHENTERMINE (ADIPEX-P) 37.5 MG TABLET phentermine (ADIPEX-P) 37.5 MG tablet      Take 1 tablet (37.5 mg total) by mouth daily before breakfast.    Take 1 tablet (37.5 mg total) by mouth daily before breakfast.   VITAMIN D, ERGOCALCIFEROL, (DRISDOL) 50000 UNITS CAPS CAPSULE Vitamin D, Ergocalciferol, (DRISDOL) 50000 units CAPS capsule      Take 1 capsule (50,000 Units total) by mouth 2 (two) times a week.    TAKE 1 CAPSULE BY MOUTH EVERY 7 DAYS.     No orders of the defined types were placed in this encounter.    Return in about 4 weeks (around 12/13/2016) for - go over lose it app. .  Anticipatory guidance and routine counseling done re: condition, txmnt options and need for follow up. All questions of patient's were answered.   Gross side effects, risk and benefits, and alternatives of medications discussed with patient.  Patient is aware that all medications have potential side effects and we are unable to predict every sideeffect or drug-drug interaction that may occur.  Expresses verbal understanding and consents to current therapy plan and treatment regiment.  Please see  AVS handed out to patient at the end of our visit for additional patient instructions/ counseling done pertaining to today's office visit.  Note: This document was prepared using Dragon voice recognition software and may include unintentional dictation errors.  Pt was in the office today for 40+ minutes, with over 50% time spent in face to face counseling of various medical concerns and in coordination of care  Subjective:   CC:   Abigail Cruz is a 51 y.o. female who presents to [place] at [facility] today for review and discussion of recent bloodwork that was done.  1. All recent blood work that we ordered approximately 1 week ago was reviewed with patient today.  Patient was counseled on all abnormalities and we discussed dietary and lifestyle changes that could help those values (also medications when appropriate).  Extensive health counseling performed and all patient's concerns/ questions were addressed.  2. Vitamin D: Significantly improve was 18 a year ago and now over 61.  Symptoms of fatigue, myalgias and decreased energy have significantly improved and patient feels like a young woman again. 3. Patient's hypertriglyceridemia has improved from prior and HDL going up. 4. Started singular- about 1 week ago- not sure if helping yet or not.  No shortness of breath or new symptoms.  Symptoms are the same or improved    Wt Readings from Last 3 Encounters:  03/22/17 278 lb 9.6 oz (126.4 kg)  02/14/17 289 lb 1.6 oz (131.1 kg)  01/12/17 299 lb 14.4 oz (136  kg)   BP Readings from Last 3 Encounters:  03/22/17 136/84  02/14/17 133/78  01/12/17 (!) 148/83   Pulse Readings from Last 3 Encounters:  03/22/17 77  02/14/17 81  01/12/17 77   BMI Readings from Last 3 Encounters:  03/22/17 51.79 kg/m  02/14/17 53.74 kg/m  01/12/17 55.75 kg/m     Patient Care Team    Relationship Specialty Notifications Start End  Mellody Dance, DO PCP - General Family Medicine   08/10/15     Full medical history updated and reviewed in the office today  Patient Active Problem List   Diagnosis Date Noted  . BMI 50.0-59.9, adult ([place]) 12/14/2016  . Bad dreams 11/10/2015  . Vivid dream 11/10/2015  . Snoring 11/10/2015  . 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 Borderline diastolic hypertension 43/32/9518  . Hypertriglyceridemia without hypercholesterolemia 08/12/2015  . Low serum HDL 08/12/2015  . 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 Environmental and seasonal allergies 08/11/2015  . h/o Nephrolithiasis  08/11/2015  . H/O tubal ligation 08/11/2015  . Family history of diabetes mellitus 08/11/2015  . Family history of stroke 08/11/2015  . Family history of hypertension 08/11/2015  . Body mass index (bmi) 60.0-69.9, adult ([place]) 08/11/2015  . Vitamin D deficiency 08/11/2015    Past Medical History:  Diagnosis Date  . Chronic kidney disease    kidney stones  . Pneumonia     Past Surgical History:  Procedure Laterality Date  . CHOLECYSTECTOMY    . TUBAL LIGATION      Social History   Tobacco Use  . Smoking status: Never Smoker  . Smokeless tobacco: Never Used  Substance Use Topics  . Alcohol use: No    Family Hx: Family History  Problem Relation Age of Onset  . Hypertension Mother   . Diabetes Mother   . Cancer Mother        cervical  . Hypertension Father   . Cancer Maternal Aunt        breast  . Cancer Paternal Aunt        breast  . Heart attack Paternal Grandfather   . Stroke Paternal Grandfather      Medications: Current Outpatient Medications  Medication Sig Dispense Refill  . albuterol (PROVENTIL) (2.5 MG/3ML) 0.083% nebulizer solution Take 3 mLs (2.5 mg total) by nebulization every 6 (six) hours as needed for wheezing or shortness of breath. 150 mL 1  . Cholecalciferol (VITAMIN D3) 5000 units CAPS Take 1 capsule by mouth daily.    . Vitamin D, Ergocalciferol, (DRISDOL) 50000 units CAPS capsule  Take 1 capsule (50,000 Units total) by mouth 2 (two) times a week. 24 capsule 4  . montelukast (SINGULAIR) 5 MG chewable tablet Chew 1 tablet by mouth daily.  1  . phentermine (ADIPEX-P) 37.5 MG tablet Take 1 tablet (37.5 mg total) by mouth daily before breakfast. 30 tablet 2   No current facility-administered medications for this visit.     Allergies:  Allergies  Allergen Reactions  . Bee Venom Anaphylaxis  . Iodine Dermatitis and Rash  . Other Anaphylaxis    ANT bites  . Latex Other (See Comments)  . Shellfish-Derived Products Palpitations and Rash    Review of Systems: General:   No F/C, wt loss Pulm:   No DIB, SOB, pleuritic chest pain Card:  No CP, palpitations Abd:  No n/v/d or pain Ext:  No inc edema from baseline   Objective:  Blood pressure 137/84, pulse 89, height 5'  1.5" (1.562 m), weight (!) 315 lb 9.6 oz (143.2 kg), last menstrual period 10/26/2016. Body mass index is 58.67 kg/m. Gen:   Well NAD, A and O *3 HEENT:    [place]/AT, EOMI,  MMM Lungs:   Normal work of breathing. CTA B/L, no Wh, rhonchi Heart:   RRR, S1, S2 WNL's, no MRG Abd:   No gross distention Exts:    warm, pink,  Brisk capillary refill, warm and well perfused.  Psych:    No HI/SI, judgement and insight good, Euthymic mood. Full Affect.

## 2016-12-13 ENCOUNTER — Other Ambulatory Visit: Payer: Self-pay

## 2016-12-13 NOTE — Telephone Encounter (Signed)
Error. MPulliam, CMA/RT(R)  

## 2016-12-14 ENCOUNTER — Encounter: Payer: Self-pay | Admitting: Family Medicine

## 2016-12-14 ENCOUNTER — Ambulatory Visit (INDEPENDENT_AMBULATORY_CARE_PROVIDER_SITE_OTHER): Payer: BLUE CROSS/BLUE SHIELD | Admitting: Family Medicine

## 2016-12-14 VITALS — BP 138/80 | HR 74 | Ht 61.5 in | Wt 309.0 lb

## 2016-12-14 DIAGNOSIS — Z6841 Body Mass Index (BMI) 40.0 and over, adult: Secondary | ICD-10-CM | POA: Diagnosis not present

## 2016-12-14 MED ORDER — PHENTERMINE HCL 37.5 MG PO TABS: 37.5000 mg | ORAL_TABLET | Freq: Every day | ORAL | 2 refills | Status: DC

## 2016-12-14 NOTE — Progress Notes (Signed)
Wt loss OV note   Impression and Recommendations:    1. BMI 50.0-59.9, adult [facility])    -Extensive discussion about risk benefits of medications.  Patient desires medications at this time despite risks. -Highly recommend over 50% of her diet be protein-50% of the pie graph in lose it app. -Long discussion with patient regarding pure protein bars, muscle milk light and Premier protein shakes etc. as supplement to her diet to increase proteins. -Increase activity level.  Meds ordered this encounter  Medications  . phentermine (ADIPEX-P) 37.5 MG tablet    Sig: Take 1 tablet (37.5 mg total) by mouth daily before breakfast.    Dispense:  30 tablet    Refill:  2     Education and routine counseling performed. Handouts provided.  The patient was counseled, risk factors were discussed, anticipatory guidance given.  - Weight Mgt: Explained to patient what BMI refers to, and what it means medically.    Told patient to think about it as a "medical risk stratification measurement" and how increasing BMI is associated with increasing risk/ or worsening state of various diseases such as hypertension, hyperlipidemia, diabetes, premature OA, depression etc.  American Heart Association guidelines for healthy diet, basically Mediterranean diet, and exercise guidelines of 30 minutes 5 days per week or more discussed in detail.  -Reminded patient the need for yearly complete physical exam office visits in addition to office visits for management of the chronic diseases  -Gross side effects, risk and benefits, and alternatives of medications discussed with patient.  Patient is aware that all medications have potential side effects and we are unable to predict every side effect or drug-drug interaction that may occur.  Expresses verbal understanding and consents to current therapy plan and treatment regimen.   Return for Goal is 15 pound weight loss by December 27.  need to see you  again.   Please see AVS handed out to patient at the end of our visit for further patient instructions/ counseling done pertaining to today's office visit.    Note: This document was prepared using Dragon voice recognition software and may include unintentional dictation errors.  Mellody Dance, DO 12/14/2016 5:06 PM   ______________________________________________________________________    Subjective:  HPI: [place] y.o. female  presents for 3 month follow up for multiple medical problems.  Last OV goals was to:  Please use lose it app over the next 4 weeks and track everything you put in your mouth.  Then we will get together and review your Please bring it in.  - Today--> lost 6 lbs since last OV.   Patient tracking but not everything.  Also still very poor selection of foods with very high carbohydrate and fat content.  Weight:  Wt Readings from Last 3 Encounters:  12/14/16 (!) 309 lb (140.2 kg)  11/15/16 (!) 315 lb 9.6 oz (143.2 kg)  10/27/16 (!) 311 lb 1.6 oz (141.1 kg)   BMI Readings from Last 3 Encounters:  12/14/16 57.44 kg/m  11/15/16 58.67 kg/m  10/27/16 57.83 kg/m   Lab Results  Component Value Date   HGBA1C 5.1 11/08/2016   HGBA1C 5.1 08/11/2015    Review of Systems: General:   No F/C, wt loss Pulm:   No DIB, SOB, pleuritic chest pain Card:  No CP, palpitations Abd:  No n/v/d or pain Ext:  No inc edema from baseline   Objective: Physical Exam: BP 138/80   Pulse 74   Ht 5' 1.5" (1.562 m)  Wt (!) 309 lb (140.2 kg)   LMP 11/17/2016 (Approximate)   BMI 57.44 kg/m  Body mass index is 57.44 kg/m. General: Well nourished, in no apparent distress. Eyes: PERRLA, EOMs, conjunctiva clr no swelling or erythema ENT/Mouth: Hearing appears normal.  Mucus Membranes Moist  Neck: Supple, no masses Resp: Respiratory effort- normal, ECTA B/L w/o W/R/R  Cardio: RRR w/o MRGs. Abdomen: no gross distention. Lymphatics:  Brisk peripheral  pulses, less 2 sec cap RF, no gross edema  M-sk: Full ROM, 5/5 strength, normal gait.  Skin: Warm, dry without rashes, lesions, ecchymosis.  Neuro: Alert, Oriented Psych: Normal affect, Insight and Judgment appropriate.    Current Medications:  Current Outpatient Medications on File Prior to Visit  Medication Sig Dispense Refill  . albuterol (PROVENTIL) (2.5 MG/3ML) 0.083% nebulizer solution Take 3 mLs (2.5 mg total) by nebulization every 6 (six) hours as needed for wheezing or shortness of breath. 150 mL 1  . Cholecalciferol (VITAMIN D3) 5000 units CAPS Take 1 capsule by mouth daily.    . montelukast (SINGULAIR) 5 MG chewable tablet Chew 1 tablet by mouth daily.  1  . Vitamin D, Ergocalciferol, (DRISDOL) 50000 units CAPS capsule Take 1 capsule (50,000 Units total) by mouth 2 (two) times a week. 24 capsule 4   No current facility-administered medications on file prior to visit.     Medical History:  Patient Active Problem List   Diagnosis Date Noted  . BMI 50.0-59.9, adult ([place]) 12/14/2016  . Bad dreams 11/10/2015  . Vivid dream 11/10/2015  . Snoring 11/10/2015  . 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 Borderline diastolic hypertension 35/46/5681  . Hypertriglyceridemia without hypercholesterolemia 08/12/2015  . Low serum HDL 08/12/2015  . 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 Environmental and seasonal allergies 08/11/2015  . h/o Nephrolithiasis  08/11/2015  . H/O tubal ligation 08/11/2015  . Family history of diabetes mellitus 08/11/2015  . Family history of stroke 08/11/2015  . Family history of hypertension 08/11/2015  . Body mass index (bmi) 60.0-69.9, adult ([place]) 08/11/2015  . Vitamin D deficiency 08/11/2015    Allergies:  Allergies  Allergen Reactions  . Bee Venom Anaphylaxis  . Iodine Dermatitis and Rash  . Other Anaphylaxis    ANT bites  . Latex Other (See Comments)  . Shellfish-Derived Products Palpitations and Rash     Family  history-  Reviewed; changed as appropriate  Social history-  Reviewed; changed as appropriate

## 2016-12-14 NOTE — Patient Instructions (Addendum)
- Pure protein bars,  -Muscle milk light, Premier protein shakes   Behavior Modification Ideas for Weight Management  Weight management involves adopting a healthy lifestyle that includes a knowledge of nutrition and exercise, a positive attitude and the right kind of motivation. Internal motives such as better health, increased energy, self-esteem and personal control increase your chances of lifelong weight management success.  Remember to have realistic goals and think long-term success. Believe in yourself and you can do it. The following information will give you ideas to help you meet your goals.  Control Your Home Environment  Eat only while sitting down at the kitchen or dining room table. Do not eat while watching television, reading, cooking, talking on the phone, standing at the refrigerator or working on the computer. Keep tempting foods out of the house - don't buy them. Keep tempting foods out of sight. Have low-calorie foods ready to eat. Unless you are preparing a meal, stay out of the kitchen. Have healthy snacks at your disposal, such as small pieces of fruit, vegetables, canned fruit, pretzels, low-fat string cheese and nonfat cottage cheese.  Control Your Work Environment  Do not eat at [employer] or keep tempting snacks at your desk. If you get hungry between meals, plan healthy snacks and bring them with you to work. During your breaks, go for a walk instead of eating. If you work around food, plan in advance the one item you will eat at mealtime. Make it inconvenient to nibble on food by chewing gum, sugarless candy or drinking water or another low-calorie beverage. Do not work through meals. Skipping meals slows down metabolism and may result in overeating at the next meal. If food is available for special occasions, either pick the healthiest item, nibble on low-fat snacks brought from home, don't have anything offered, choose one option and have a small amount, or  have only a beverage.  Control Your Mealtime Environment  Serve your plate of food at the stove or kitchen counter. Do not put the serving dishes on the table. If you do put dishes on the table, remove them immediately when finished eating. Fill half of your plate with vegetables, a quarter with lean protein and a quarter with starch. Use smaller plates, bowls and glasses. A smaller portion will look large when it is in a little dish. Politely refuse second helpings. When fixing your plate, limit portions of food to one scoop/serving or less.   Daily Food Management  Replace eating with another activity that you will not associate with food. Wait 20 minutes before eating something you are craving. Drink a large glass of water or diet soda before eating. Always have a big glass or bottle of water to drink throughout the day. Avoid high-calorie add-ons such as cream with your coffee, butter, mayonnaise and salad dressings.  Shopping: Do not shop when hungry or tired. Shop from a list and avoid buying anything that is not on your list. If you must have tempting foods, buy individual-sized packages and try to find a lower-calorie alternative. Don't taste test in the store. Read food labels. Compare products to help you make the healthiest choices.  Preparation: Chew a piece of gum while cooking meals. Use a quarter teaspoon if you taste test your food. Try to only fix what you are going to eat, leaving yourself no chance for seconds. If you have prepared more food than you need, portion it into individual containers and freeze or refrigerate immediately. Don't snack  while cooking meals.  Eating: Eat slowly. Remember it takes about 20 minutes for your stomach to send a message to your brain that it is full. Don't let fake hunger make you think you need more. The ideal way to eat is to take a bite, put your utensil down, take a sip of water, cut your next bite, take a bit, put your  utensil down and so on. Do not cut your food all at one time. Cut only as needed. Take small bites and chew your food well. Stop eating for a minute or two at least once during a meal or snack. Take breaks to reflect and have conversation.  Cleanup and Leftovers: Label leftovers for a specific meal or snack. Freeze or refrigerate individual portions of leftovers. Do not clean up if you are still hungry.  Eating Out and Social Eating  Do not arrive hungry. Eat something light before the meal. Try to fill up on low-calorie foods, such as vegetables and fruit, and eat smaller portions of the high-calorie foods. Eat foods that you like, but choose small portions. If you want seconds, wait at least 20 minutes after you have eaten to see if you are actually hungry or if your eyes are bigger than your stomach. Limit alcoholic beverages. Try a soda water with a twist of lime. Do not skip other meals in the day to save room for the special event.  At Restaurants: Order  la carte rather than buffet style. Order some vegetables or a salad for an appetizer instead of eating bread. If you order a high-calorie dish, share it with someone. Try an after-dinner mint with your coffee. If you do have dessert, share it with two or more people. Don't overeat because you do not want to waste food. Ask for a doggie bag to take extra food home. Tell the server to put half of your entree in a to go bag before the meal is served to you. Ask for salad dressing, gravy or high-fat sauces on the side. Dip the tip of your fork in the dressing before each bite. If bread is served, ask for only one piece. Try it plain without butter or oil. At [employer] where oil and vinegar is served with bread, use only a small amount of oil and a lot of vinegar for dipping.  At a [facility]: Offer to bring a dish, appetizer or dessert that is low in calories. Serve yourself small portions or tell the host that you  only want a small amount. Stand or sit away from the snack table. Stay away from the kitchen or stay busy if you are near the food. Limit your alcohol intake.  At [employer] and Cafeterias: Cover most of your plate with lettuce and/or vegetables. Use a salad plate instead of a dinner plate. After eating, clear away your dishes before having coffee or tea.  Entertaining at Home: Explore low-fat, low-cholesterol cookbooks. Use single-serving foods like chicken breasts or hamburger patties. Prepare low-calorie appetizers and desserts.   Holidays: Keep tempting foods out of sight. Decorate the house without using food. Have low-calorie beverages and foods on hand for guests. Allow yourself one planned treat a day. Don't skip meals to save up for the holiday feast. Eat regular, planned meals.   Exercise Well  Make exercise a priority and a planned activity in the day. If possible, walk the entire or part of the distance to work. Get an exercise buddy. Go for a walk with  a colleague during one of your breaks, go to the gym, run or take a walk with a friend, walk in the mall with a shopping companion. Park at the end of the parking lot and walk to the store or office entrance. Always take the stairs all of the way or at least part of the way to your floor. If you have a desk job, walk around the office frequently. Do leg lifts while sitting at your desk. Do something outside on the weekends like going for a hike or a bike ride.   Have a Healthy Attitude  Make health your weight management priority. Be realistic. Have a goal to achieve a healthier you, not necessarily the lowest weight or ideal weight based on calculations or tables. Focus on a healthy eating style, not on dieting. Dieting usually lasts for a short amount of time and rarely produces long-term success. Think long term. You are developing new healthy behaviors to follow next month, in a year and in a decade.    This  information is for educational purposes only and is not intended to replace the advice of your doctor or health care provider. We encourage you to discuss with your doctor any questions or concerns you may have.        Guidelines for Losing Weight   We want weight loss that will last so you should lose 1-2 pounds a week.  THAT IS IT! Please pick THREE things a month to change. Once it is a habit check off the item. Then pick another three items off the list to become habits.  If you are already doing a habit on the list GREAT!  Cross that item off!  Don't drink your calories. Ie, alcohol, soda, fruit juice, and sweet tea.   Drink more water. Drink a glass when you feel hungry or before each meal.   Eat breakfast - Complex carb and protein (likeDannon light and fit yogurt, oatmeal, fruit, eggs, [place] bacon).  Measure your cereal.  Eat no more than one cup a day. (ie Kashi)  Eat an apple a day.  Add a vegetable a day.  Try a new vegetable a month.  Use Pam! Stop using oil or butter to cook.  Don't finish your plate or use smaller plates.  Share your dessert.  Eat sugar free Jello for dessert or frozen grapes.  Don't eat 2-3 hours before bed.  Switch to whole wheat bread, pasta, and brown rice.  Make healthier choices when you eat out. No fries!  Pick baked chicken, NOT fried.  Don't forget to SLOW DOWN when you eat. It is not going anywhere.   Take the stairs.  Park far away in the parking lot  Lift soup cans (or weights) for 10 minutes while watching TV.  Walk at work for 10 minutes during break.  Walk outside 1 time a week with your friend, kids, dog, or significant other.  Start a walking group at church.  Walk the mall as much as you can tolerate.   Keep a food diary.  Weigh yourself daily.  Walk for 15 minutes 3 days per week.  Cook at home more often and eat out less. If life happens and you go back to old habits, it is okay.  Just start over.  You can do it!  If you experience chest pain, get short of breath, or tired during the exercise, please stop immediately and inform your doctor.    Before you even begin  to attack a weight-loss plan, it pays to remember this: You are not fat. You have fat. Losing weight isn't about blame or shame; it's simply another achievement to accomplish. Dieting is like any other skill-you have to buckle down and work at it. As long as you act in a smart, reasonable way, you'll ultimately get where you want to be. Here are some weight loss pearls for you.   1. It's Not a Diet. It's a Lifestyle Thinking of a diet as something you're on and suffering through only for the short term doesn't work. To shed weight and keep it off, you need to make permanent changes to the way you eat. It's OK to indulge occasionally, of course, but if you cut calories temporarily and then revert to your old way of eating, you'll gain back the weight quicker than you can say yo-yo. Use it to lose it. Research shows that one of the best predictors of long-term weight loss is how many pounds you drop in the first month. For that reason, nutritionists often suggest being stricter for the first two weeks of your new eating strategy to build momentum. Cut out added sugar and alcohol and avoid unrefined carbs. After that, figure out how you can reincorporate them in a way that's healthy and maintainable.  2. There's a Right Way to Exercise Working out burns calories and fat and boosts your metabolism by building muscle. But those trying to lose weight are notorious for overestimating the number of calories they burn and underestimating the amount they take in. Unfortunately, your system is biologically programmed to hold on to extra pounds and that means when you start exercising, your body senses the deficit and ramps up its hunger signals. If you're not diligent, you'll eat everything you burn and then some. Use it, to lose it. Cardio gets all  the exercise glory, but strength and interval training are the real heroes. They help you build lean muscle, which in turn increases your metabolism and calorie-burning ability 3. Don't Overreact to Mild Hunger Some people have a hard time losing weight because of hunger anxiety. To them, being hungry is bad-something to be avoided at all costs-so they carry snacks with them and eat when they don't need to. Others eat because they're stressed out or bored. While you never want to get to the point of being ravenous (that's when bingeing is likely to happen), a hunger pang, a craving, or the fact that it's 3:00 p.m. should not send you racing for the vending machine or obsessing about the energy bar in your purse. Ideally, you should put off eating until your stomach is growling and it's difficult to concentrate.  Use it to lose it. When you feel the urge to eat, use the HALT method. Ask yourself, Am I really hungry? Or am I angry or anxious, lonely or bored, or tired? If you're still not certain, try the apple test. If you're truly hungry, an apple should seem delicious; if it doesn't, something else is going on. Or you can try drinking water and making yourself busy, if you are still hungry try a healthy snack.  4. Not All Calories Are Created Equal The mechanics of weight loss are pretty simple: Take in fewer calories than you use for energy. But the kind of food you eat makes all the difference. Processed food that's high in saturated fat and refined starch or sugar can cause inflammation that disrupts the hormone signals that tell your brain you're  full. The result: You eat a lot more.  Use it to lose it. Clean up your diet. Swap in whole, unprocessed foods, including vegetables, lean protein, and healthy fats that will fill you up and give you the biggest nutritional bang for your calorie buck. In a few weeks, as your brain starts receiving regular hunger and fullness signals once again, you'll notice that  you feel less hungry overall and naturally start cutting back on the amount you eat.  5. Protein, Produce, and Plant-Based Fats Are Your Weight-Loss Trinity Here's why eating the three Ps regularly will help you drop pounds. Protein fills you up. You need it to build lean muscle, which keeps your metabolism humming so that you can torch more fat. People in a weight-loss program who ate double the recommended daily allowance for protein (about 110 grams for a 150-pound woman) lost 70 percent of their weight from fat, while people who ate the RDA lost only about 40 percent, one study found. Produce is packed with filling fiber. "It's very difficult to consume too many calories if you're eating a lot of vegetables. Example: Three cups of broccoli is a lot of food, yet only 93 calories. (Fruit is another story. It can be easy to overeat and can contain a lot of calories from sugar, so be sure to monitor your intake.) Plant-based fats like olive oil and those in avocados and nuts are healthy and extra satiating.  Use it to lose it. Aim to incorporate each of the three Ps into every meal and snack. People who eat protein throughout the day are able to keep weight off, according to a study in the [place] of Clinical Nutrition. In addition to meat, poultry and seafood, good sources are beans, lentils, eggs, tofu, and yogurt. As for fat, keep portion sizes in check by measuring out salad dressing, oil, and nut butters (shoot for one to two tablespoons). Finally, eat veggies or a little fruit at every meal. People who did that consumed 308 fewer calories but didn't feel any hungrier than when they didn't eat more produce.  7. How You Eat Is As Important As What You Eat In order for your brain to register that you're full, you need to focus on what you're eating. Sit down whenever you eat, preferably at a table. Turn off the TV or computer, put down your phone, and look at your food. Smell it. Chew slowly,  and don't put another bite on your fork until you swallow. When women ate lunch this attentively, they consumed 30 percent less when snacking later than those who listened to an audiobook at lunchtime, according to a study in the [place] of Nutrition. 8. Weighing Yourself Really Works The scale provides the best evidence about whether your efforts are paying off. Seeing the numbers tick up or down or stagnate is motivation to keep going-or to rethink your approach. A 2015 study at [facility] found that daily weigh-ins helped people lose more weight, keep it off, and maintain that loss, even after two years. Use it to lose it. Step on the scale at the same time every day for the best results. If your weight shoots up several pounds from one weigh-in to the next, don't freak out. Eating a lot of salt the night before or having your period is the likely culprit. The number should return to normal in a day or two. It's a steady climb that you need to do something about. 9. Too Much  Stress and Too Little Sleep Are Your Enemies When you're tired and frazzled, your body cranks up the production of cortisol, the stress hormone that can cause carb cravings. Not getting enough sleep also boosts your levels of ghrelin, a hormone associated with hunger, while suppressing leptin, a hormone that signals fullness and satiety. People on a diet who slept only five and a half hours a night for two weeks lost 55 percent less fat and were hungrier than those who slept eight and a half hours, according to a study in the [place]. Use it to lose it. Prioritize sleep, aiming for seven hours or more a night, which research shows helps lower stress. And make sure you're getting quality zzz's. If a snoring spouse or a fidgety cat wakes you up frequently throughout the night, you may end up getting the equivalent of just four hours of sleep, according to a study from [facility]. Keep  pets out of the bedroom, and use a white-noise app to drown out snoring. 10. You Will Hit a plateau-And You Can Bust Through It As you slim down, your body releases much less leptin, the fullness hormone.  If you're not strength training, start right now. Building muscle can raise your metabolism to help you overcome a plateau. To keep your body challenged and burning calories, incorporate new moves and more intense intervals into your workouts or add another sweat session to your weekly routine. Alternatively, cut an extra 100 calories or so a day from your diet. Now that you've lost weight, your body simply doesn't need as much fuel.    Since food equals calories, in order to lose weight you must either eat fewer calories, exercise more to burn off calories with activity, or both. Food that is not used to fuel the body is stored as fat. A major component of losing weight is to make smarter food choices. Here's how:  1)   Limit non-nutritious foods, such as: Sugar, honey, syrups and candy Pastries, donuts, pies, cakes and cookies Soft drinks, sweetened juices and alcoholic beverages  2)  Cut down on high-fat foods by: - Choosing poultry, fish or lean red meat - Choosing low-fat cooking methods, such as baking, broiling, steaming, grilling and boiling - Using low-fat or non-fat dairy products - Using vinaigrette, herbs, lemon or fat-free salad dressings - Avoiding fatty meats, such as bacon, sausage, franks, ribs and luncheon meats - Avoiding high-fat snacks like nuts, chips and chocolate - Avoiding fried foods - Using less butter, margarine, oil and mayonnaise - Avoiding high-fat gravies, cream sauces and cream-based soups  3) Eat a variety of foods, including: - Fruit and vegetables that are raw, steamed or baked - Whole grains, breads, cereal, rice and pasta - Dairy products, such as low-fat or non-fat milk or yogurt, low-fat cottage cheese and low-fat cheese - Protein-rich foods like  chicken, [place], fish, lean meat and legumes, or beans  4) Change your eating habits by: - Eat three balanced meals a day to help control your hunger - Watch portion sizes and eat small servings of a variety of foods - Choose low-calorie snacks - Eat only when you are hungry and stop when you are satisfied - Eat slowly and try not to perform other tasks while eating - Find other activities to distract you from food, such as walking, taking up a hobby or being involved in the community - Include regular exercise in your daily routine ( minimum of 20 min  of moderate-intensity exercise at least 5 days/week)  - Find a support group, if necessary, for emotional support in your weight loss journey           Easy ways to cut 100 calories   1. Eat your eggs with hot sauce OR salsa instead of cheese.  Eggs are great for breakfast, but many people consider eggs and cheese to be BFFs. Instead of cheese-1 oz. of cheddar has 114 calories-top your eggs with hot sauce, which contains no calories and helps with satiety and metabolism. Salsa is also a great option!!  2. Top your toast, waffles or pancakes with fresh berries instead of jelly or syrup. Half a cup of berries-fresh, frozen or thawed-has about 40 calories, compared with 2 tbsp. of maple syrup or jelly, which both have about 100 calories. The berries will also give you a good punch of fiber, which helps keep you full and satisfied and won't spike blood sugar quickly like the jelly or syrup. 3. Swap the non-fat latte for black coffee with a splash of half-and-half. Contrary to its name, that non-fat latte has 130 calories and a startling 19g of carbohydrates per 16 oz. serving. Replacing that 'light' drinkable dessert with a black coffee with a splash of half-and-half saves you more than 100 calories per 16 oz. serving. 4. Sprinkle salads with freeze-dried raspberries instead of dried cranberries. If you want a sweet addition to your  nutritious salad, stay away from dried cranberries. They have a whopping 130 calories per  cup and 30g carbohydrates. Instead, sprinkle freeze-dried raspberries guilt-free and save more than 100 calories per  cup serving, adding 3g of belly-filling fiber. 5. Go for mustard in place of mayo on your sandwich. Mustard can add really nice flavor to any sandwich, and there are tons of varieties, from spicy to honey. A serving of mayo is 95 calories, versus 10 calories in a serving of mustard.  Or try an avocado mayo spread: You can find the recipe few click this link: https://www.californiaavocado.com/recipes/recipe-container/[place]-avocado-mayo 6. Choose a DIY salad dressing instead of the store-bought kind. Mix Dijon or whole grain mustard with low-fat Kefir or red wine vinegar and garlic. 7. Use hummus as a spread instead of a dip. Use hummus as a spread on a high-fiber cracker or tortilla with a sandwich and save on calories without sacrificing taste. 8. Pick just one salad "accessory." Salad isn't automatically a calorie winner. It's easy to over-accessorize with toppings. Instead of topping your salad with nuts, avocado and cranberries (all three will clock in at 313 calories), just pick one. The next day, choose a different accessory, which will also keep your salad interesting. You don't wear all your jewelry every day, right? 9. Ditch the white pasta in favor of spaghetti squash. One cup of cooked spaghetti squash has about 40 calories, compared with traditional spaghetti, which comes with more than 200. Spaghetti squash is also nutrient-dense. It's a good source of fiber and Vitamins A and C, and it can be eaten just like you would eat pasta-with a great tomato sauce and [place] meatballs or with pesto, tofu and spinach, for example. 10. Dress up your chili, soups and stews with non-fat [place] yogurt instead of sour cream. Just a 'dollop' of sour cream can set you back 115 calories and a  whopping 12g of fat-seven of which are of the artery-clogging variety. Added bonus: [place] yogurt is packed with muscle-building protein, calcium and B Vitamins. 11. Mash cauliflower instead of mashed potatoes.  One cup of traditional mashed potatoes-in all their creamy goodness-has more than 200 calories, compared to mashed cauliflower, which you can typically eat for less than 100 calories per 1 cup serving. Cauliflower is a great source of the antioxidant indole-3-carbinol (I3C), which may help reduce the risk of some cancers, like breast cancer. 12. Ditch the ice cream sundae in favor of a [place] yogurt parfait. Instead of a cup of ice cream or fro-yo for dessert, try 1 cup of nonfat Greek yogurt topped with fresh berries and a sprinkle of cacao nibs. Both toppings are packed with antioxidants, which can help reduce cellular inflammation and oxidative damage. And the comparison is a no-brainer: One cup of ice cream has about 275 calories; one cup of frozen yogurt has about 230; and a cup of Greek yogurt has just 130, plus twice the protein, so you're less likely to return to the freezer for a second helping. 13. Put olive oil in a spray container instead of using it directly from the bottle. Each tablespoon of olive oil is 120 calories and 15g of fat. Use a mister instead of pouring it straight into the pan or onto a salad. This allows for portion control and will save you more than 100 calories. 14. When baking, substitute canned pumpkin for butter or oil. Canned pumpkin-not pumpkin pie mix-is loaded with Vitamin A, which is important for skin and eye health, as well as immunity. And the comparisons are pretty crazy:  cup of canned pumpkin has about 40 calories, compared to butter or oil, which has more than 800 calories. Yes, 800 calories. Applesauce and mashed banana can also serve as good substitutions for butter or oil, usually in a 1:1 ratio. 15. Top casseroles with high-fiber cereal instead of  breadcrumbs. Breadcrumbs are typically made with white bread, while breakfast cereals contain 5-9g of fiber per serving. Not only will you save more than 150 calories per  cup serving, the swap will also keep you more full and you'll get a metabolism boost from the added fiber. 16. Snack on pistachios instead of macadamia nuts. Believe it or not, you get the same amount of calories from 35 pistachios (100 calories) as you would from only five macadamia nuts. 17. Chow down on kale chips rather than potato chips. This is my favorite 'don't knock it 'till you try it' swap. Kale chips are so easy to make at home, and you can spice them up with a little grated parmesan or chili powder. Plus, they're a mere fraction of the calories of potato chips, but with the same crunch factor we crave so often. 18. Add seltzer and some fruit slices to your cocktail instead of soda or fruit juice. One cup of soda or fruit juice can pack on as much as 140 calories. Instead, use seltzer and fruit slices. The fruit provides valuable phytochemicals, such as flavonoids and anthocyanins, which help to combat cancer and stave off the aging process.

## 2017-01-12 ENCOUNTER — Encounter: Payer: Self-pay | Admitting: Family Medicine

## 2017-01-12 ENCOUNTER — Ambulatory Visit (INDEPENDENT_AMBULATORY_CARE_PROVIDER_SITE_OTHER): Payer: BLUE CROSS/BLUE SHIELD | Admitting: Family Medicine

## 2017-01-12 VITALS — BP 148/83 | HR 77 | Temp 98.6°F | Resp 16 | Ht 61.5 in | Wt 299.9 lb

## 2017-01-12 DIAGNOSIS — Z6841 Body Mass Index (BMI) 40.0 and over, adult: Secondary | ICD-10-CM | POA: Diagnosis not present

## 2017-01-12 NOTE — Patient Instructions (Signed)
Goals for next time is approximately 5% of your weight which should be approximately 15 pounds for 30 days from now.  Cut your phentermine in half as it is not an issue of appetite, you need to eat more of the good foods such as proteins etc.  I want you to be at minimum only 500 cal under  Of course always track even if you do not eat right or anything like that always track

## 2017-01-12 NOTE — Progress Notes (Signed)
Wt loss OV note   Impression and Recommendations:    1. BMI 50.0-59.9, adult ([place])     - Weight Mgt: Explained to patient what BMI refers to, and what it means medically. Told patient to think about it as a "medical risk stratification measurement" and how increasing BMI is associated with increasing risk/ or worsening state of various diseases such as hypertension, hyperlipidemia, diabetes, premature OA, depression etc.  American Heart Association guidelines for healthy diet, basically Mediterranean diet, and exercise guidelines of 30 minutes 5 days per week or more discussed in detail.  -Reminded patient the need for yearly complete physical exam office visits in addition to office visits for management of the chronic diseases  -Advised the patient to use half a tablet of phentermine due to decreased calorie intake and increase more good calorie intake. Advised the patient to consume 3-4 protein shakes daily in addition to current daily intake.   -Advised to continue taking Vitamin D supplement.   -Health goal of 15 lbs weight loss prior to next visit in 4 weeks and also to be minimally 500 calories under per day.    Education and routine counseling performed. Handouts provided.   The patient was counseled, risk factors were discussed, anticipatory guidance given.  -Gross side effects, risk and benefits, and alternatives of medications discussed with patient.  Patient is aware that all medications have potential side effects and we are unable to predict every side effect or drug-drug interaction that may occur.  Expresses verbal understanding and consents to current therapy plan and treatment regimen.   Return in about 4 weeks (around 02/09/2017) for wt loss/ getting healthier. 15 pound wt loss goal   Please see AVS handed out to patient at the end of our visit for further patient instructions/ counseling done pertaining to today's office visit.    Note: This document was  prepared using Dragon voice recognition software and may include unintentional dictation errors.  This document serves as a record of services personally performed by Mellody Dance, DO. It was created on her behalf by Steva Colder, a trained medical scribe. The creation of this record is based on the scribe's personal observations and the provider's statements to them.   I have reviewed the above documentation for accuracy and completeness, and I agree with the above.   Mellody Dance 01/12/17 11:35 AM  ______________________________________________________________________    Subjective:  HPI: Abigail Cruz y.o. female  presents for 3 month follow up for multiple medical problems.   She reports that she lost 10 lbs, which she is very excited about. She has been tracking her calorie intake daily through the [employer] It[employer] App.   She is having issues with consuming her protein intake. Pt states that she has been under 1,000 calories per day and she notes that this is due to not consuming as much food as she should. She voices that the shakes that she is currently using is not as tasty and is hard to consume.   No acute issues with phentermine or increased dosage of Vitamin D.   She doesn't consume alcohol.      Weight:  Wt Readings from Last 3 Encounters:  01/12/17 299 lb 14.4 oz (136 kg)  12/14/16 (!) 309 lb (140.2 kg)  11/15/16 (!) 315 lb 9.6 oz (143.2 kg)   BMI Readings from Last 3 Encounters:  01/12/17 55.75 kg/m  12/14/16 57.44 kg/m  11/15/16 58.67 kg/m   Lab Results  Component Value Date  HGBA1C 5.1 11/08/2016   HGBA1C 5.1 08/11/2015    Review of Systems: General:   No F/C, wt loss Pulm:   No DIB, SOB, pleuritic chest pain Card:  No CP, palpitations Abd:  No n/v/d or pain Ext:  No inc edema from baseline   Objective: Physical Exam: BP (!) 148/83   Pulse 77   Temp 98.6 F (37 C) (Oral)   Resp 16   Ht 5' 1.5" (1.562 m)   Wt 299 lb 14.4 oz (136  kg)   LMP 01/10/2017   SpO2 95%   BMI 55.75 kg/m  Body mass index is 55.75 kg/m. General: Well nourished, in no apparent distress. Eyes: PERRLA, EOMs, conjunctiva clr no swelling or erythema ENT/Mouth: Hearing appears normal.  Mucus Membranes Moist  Neck: Supple, no masses Resp: Respiratory effort- normal, ECTA B/L w/o W/R/R  Cardio: RRR w/o MRGs. Abdomen: no gross distention. Lymphatics:  Brisk peripheral pulses, less 2 sec cap RF, no gross edema  M-sk: Full ROM, 5/5 strength, normal gait.  Skin: Warm, dry without rashes, lesions, ecchymosis.  Neuro: Alert, Oriented Psych: Normal affect, Insight and Judgment appropriate.    Current Medications:  Current Outpatient Medications on File Prior to Visit  Medication Sig Dispense Refill  . albuterol (PROVENTIL) (2.5 MG/3ML) 0.083% nebulizer solution Take 3 mLs (2.5 mg total) by nebulization every 6 (six) hours as needed for wheezing or shortness of breath. 150 mL 1  . Cholecalciferol (VITAMIN D3) 5000 units CAPS Take 1 capsule by mouth daily.    . montelukast (SINGULAIR) 5 MG chewable tablet Chew 1 tablet by mouth daily.  1  . phentermine (ADIPEX-P) 37.5 MG tablet Take 1 tablet (37.5 mg total) by mouth daily before breakfast. 30 tablet 2  . Vitamin D, Ergocalciferol, (DRISDOL) 50000 units CAPS capsule Take 1 capsule (50,000 Units total) by mouth 2 (two) times a week. 24 capsule 4   No current facility-administered medications on file prior to visit.     Medical History:  Patient Active Problem List   Diagnosis Date Noted  . BMI 50.0-59.9, adult ([place]) 12/14/2016  . Bad dreams 11/10/2015  . Vivid dream 11/10/2015  . Snoring 11/10/2015  . 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 Borderline diastolic hypertension 16/10/9602  . Hypertriglyceridemia without hypercholesterolemia 08/12/2015  . Low serum HDL 08/12/2015  . 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 Environmental and seasonal allergies 08/11/2015  .  h/o Nephrolithiasis  08/11/2015  . H/O tubal ligation 08/11/2015  . Family history of diabetes mellitus 08/11/2015  . Family history of stroke 08/11/2015  . Family history of hypertension 08/11/2015  . Body mass index (bmi) 60.0-69.9, adult ([place]) 08/11/2015  . Vitamin D deficiency 08/11/2015    Allergies:  Allergies  Allergen Reactions  . Bee Venom Anaphylaxis  . Iodine Dermatitis and Rash  . Other Anaphylaxis    ANT bites  . Latex Other (See Comments)  . Shellfish-Derived Products Palpitations and Rash     Family history-  Reviewed; changed as appropriate  Social history-  Reviewed; changed as appropriate

## 2017-02-07 ENCOUNTER — Telehealth: Payer: Self-pay | Admitting: Family Medicine

## 2017-02-07 NOTE — Telephone Encounter (Signed)
Patient called states she needs to change her appt from 1/28 to 1/29 for wt check  vs Dr's. appt--- Please call pt at [phone] asap..  --glh

## 2017-02-08 NOTE — Telephone Encounter (Signed)
Appointment was changed for the patient. MPulliam, CMA/RT(R)

## 2017-02-13 ENCOUNTER — Ambulatory Visit: Payer: BLUE CROSS/BLUE SHIELD | Admitting: Family Medicine

## 2017-02-14 ENCOUNTER — Encounter: Payer: Self-pay | Admitting: Adult Health

## 2017-02-14 ENCOUNTER — Ambulatory Visit (INDEPENDENT_AMBULATORY_CARE_PROVIDER_SITE_OTHER): Payer: BLUE CROSS/BLUE SHIELD | Admitting: Adult Health

## 2017-02-14 DIAGNOSIS — Z6841 Body Mass Index (BMI) 40.0 and over, adult: Secondary | ICD-10-CM | POA: Diagnosis not present

## 2017-02-14 MED ORDER — PHENTERMINE HCL 37.5 MG PO TABS: 37.5000 mg | ORAL_TABLET | Freq: Every day | ORAL | 2 refills | Status: DC

## 2017-02-14 NOTE — Patient Instructions (Signed)
Exercising to Lose Weight Exercising can help you to lose weight. In order to lose weight through exercise, you need to do vigorous-intensity exercise. You can tell that you are exercising with vigorous intensity if you are breathing very hard and fast and cannot hold a conversation while exercising. Moderate-intensity exercise helps to maintain your current weight. You can tell that you are exercising at a moderate level if you have a higher heart rate and faster breathing, but you are still able to hold a conversation. How often should I exercise? Choose an activity that you enjoy and set realistic goals. Your health care provider can help you to make an activity plan that works for you. Exercise regularly as directed by your health care provider. This may include:  Doing resistance training twice each week, such as: ? Push-ups. ? Sit-ups. ? Lifting weights. ? Using resistance bands.  Doing a given intensity of exercise for a given amount of time. Choose from these options: ? 150 minutes of moderate-intensity exercise every week. ? 75 minutes of vigorous-intensity exercise every week. ? A mix of moderate-intensity and vigorous-intensity exercise every week.  Children, pregnant women, people who are out of shape, people who are overweight, and older adults may need to consult a health care provider for individual recommendations. If you have any sort of medical condition, be sure to consult your health care provider before starting a new exercise program. What are some activities that can help me to lose weight?  Walking at a rate of at least 4.5 miles an hour.  Jogging or running at a rate of 5 miles per hour.  Biking at a rate of at least 10 miles per hour.  Lap swimming.  Roller-skating or in-line skating.  Cross-country skiing.  Vigorous competitive sports, such as football, basketball, and soccer.  Jumping rope.  Aerobic dancing. How can I be more active in my day-to-day  activities?  Use the stairs instead of the elevator.  Take a walk during your lunch break.  If you drive, park your car farther away from work or school.  If you take public transportation, get off one stop early and walk the rest of the way.  Make all of your phone calls while standing up and walking around.  Get up, stretch, and walk around every 30 minutes throughout the day. What guidelines should I follow while exercising?  Do not exercise so much that you hurt yourself, feel dizzy, or get very short of breath.  Consult your health care provider prior to starting a new exercise program.  Wear comfortable clothes and shoes with good support.  Drink plenty of water while you exercise to prevent dehydration or heat stroke. Body water is lost during exercise and must be replaced.  Work out until you breathe faster and your heart beats faster. This information is not intended to replace advice given to you by your health care provider. Make sure you discuss any questions you have with your health care provider. Document Released: 02/05/2010 Document Revised: 06/11/2015 Document Reviewed: 06/06/2013 Elsevier Interactive Patient Education  2018 [place] ON THE WEIGHT LOSS!!! Increase water intake, strive for at least 100 oz/day. Continue with Lose It App and slowly increase low impact exercise- stationary bike, stretching. 3 months of phentermine provided, please follow- up with Dr. Raliegh Scarlet in 4 weeks. NICE TO SEE YOU!

## 2017-02-14 NOTE — Assessment & Plan Note (Addendum)
Wt today 289, BMI 53.74, down 10 lbs since last OV 01/12/17 She reports medication compliance and denies SE She has been using Lose It App and been following high protein diet She plans on starting low impact exercise program- gym at work and home [place] reviewed- no contraindications noted. 3 months of phentermine provided.

## 2017-02-14 NOTE — Progress Notes (Signed)
Subjective:    Patient ID: Abigail Cruz, female    DOB: 12/11/1965, 52 y.o.   MRN: [id number]  HPI:  Abigail Cruz is here for f/u: medical wt loss.  Abigail Cruz has been taking phentermine 37.5mg  each morning and denies SE.  Abigail Cruz has been on rx for 3 months and has steadily bene losing 8-10 lbs/month. Current wt 289,started at 315 Abigail Cruz has been using Lose It App tracking food and walks all day during work. Abigail Cruz plans on starting a low impact exercise program in the next few weeks. Abigail Cruz estiamtes to drink 30-40 oz water/day. Abigail Cruz denies tobacco/ETOH use.  Patient Care Team    Relationship Specialty Notifications Start End  Mellody Dance, DO PCP - General Family Medicine  08/10/15     Patient Active Problem List   Diagnosis Date Noted  . BMI 50.0-59.9, adult ([place]) 12/14/2016  . Bad dreams 11/10/2015  . Vivid dream 11/10/2015  . Snoring 11/10/2015  . 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 Borderline diastolic hypertension 84/16/6063  . Hypertriglyceridemia without hypercholesterolemia 08/12/2015  . Low serum HDL 08/12/2015  . 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 Environmental and seasonal allergies 08/11/2015  . h/o Nephrolithiasis  08/11/2015  . H/O tubal ligation 08/11/2015  . Family history of diabetes mellitus 08/11/2015  . Family history of stroke 08/11/2015  . Family history of hypertension 08/11/2015  . Body mass index (bmi) 60.0-69.9, adult ([place]) 08/11/2015  . Vitamin D deficiency 08/11/2015     Past Medical History:  Diagnosis Date  . Chronic kidney disease    kidney stones  . Pneumonia      Past Surgical History:  Procedure Laterality Date  . CHOLECYSTECTOMY    . TUBAL LIGATION       Family History  Problem Relation Age of Onset  . Hypertension Mother   . Diabetes Mother   . Cancer Mother        cervical  . Hypertension Father   . Cancer Maternal Aunt        breast  . Cancer Paternal Aunt        breast  . Heart attack Paternal  Grandfather   . Stroke Paternal Grandfather      Social History   Substance and Sexual Activity  Drug Use No     Social History   Substance and Sexual Activity  Alcohol Use No     Social History   Tobacco Use  Smoking Status Never Smoker  Smokeless Tobacco Never Used     Outpatient Encounter Medications as of 02/14/2017  Medication Sig  . albuterol (PROVENTIL) (2.5 MG/3ML) 0.083% nebulizer solution Take 3 mLs (2.5 mg total) by nebulization every 6 (six) hours as needed for wheezing or shortness of breath.  . Cholecalciferol (VITAMIN D3) 5000 units CAPS Take 1 capsule by mouth daily.  . montelukast (SINGULAIR) 5 MG chewable tablet Chew 1 tablet by mouth daily.  . phentermine (ADIPEX-P) 37.5 MG tablet Take 1 tablet (37.5 mg total) by mouth daily before breakfast.  . Vitamin D, Ergocalciferol, (DRISDOL) 50000 units CAPS capsule Take 1 capsule (50,000 Units total) by mouth 2 (two) times a week.  . [DISCONTINUED] phentermine (ADIPEX-P) 37.5 MG tablet Take 1 tablet (37.5 mg total) by mouth daily before breakfast.   No facility-administered encounter medications on file as of 02/14/2017.     Allergies: Bee venom; Iodine; Other; Latex; and Shellfish-derived products  Body mass index is 53.74 kg/m.  Blood pressure 133/78, pulse 81, weight 289  lb 1.6 oz (131.1 kg), last menstrual period 02/10/2017, SpO2 99 %.     Review of Systems  Constitutional: Negative for activity change, appetite change, chills, diaphoresis, fatigue, fever and unexpected weight change.  Eyes: Negative for visual disturbance.  Respiratory: Negative for cough, chest tightness, shortness of breath, wheezing and stridor.   Cardiovascular: Negative for chest pain, palpitations and leg swelling.  Gastrointestinal: Negative for abdominal distention, abdominal pain, blood in stool, constipation, diarrhea, nausea and vomiting.  Neurological: Negative for dizziness and light-headedness.   Psychiatric/Behavioral: Negative for sleep disturbance.       Objective:   Physical Exam  Constitutional: Abigail Cruz is oriented to person, place, and time. Abigail Cruz appears well-developed and well-nourished. No distress.  HENT:  Head: Normocephalic and atraumatic.  Right Ear: External ear normal.  Left Ear: External ear normal.  Cardiovascular: Normal rate, regular rhythm, normal heart sounds and intact distal pulses.  No murmur heard. Pulmonary/Chest: Effort normal and breath sounds normal. No respiratory distress. Abigail Cruz has no wheezes. Abigail Cruz has no rales. Abigail Cruz exhibits no tenderness.  Neurological: Abigail Cruz is alert and oriented to person, place, and time.  Skin: Skin is warm. No rash noted. Abigail Cruz is not diaphoretic. No erythema. No pallor.  Psychiatric: Abigail Cruz has a normal mood and affect. Her behavior is normal. Judgment and thought content normal.  Nursing note and vitals reviewed.         Assessment & Plan:   1. BMI 50.0-59.9, adult ([facility])     BMI 50.0-59.9, adult ([place]) Wt today 289, BMI 53.74, down 10 lbs since last OV 01/12/17 Abigail Cruz reports medication compliance and denies SE Abigail Cruz has been using Lose It App and been following high protein diet Abigail Cruz plans on starting low impact exercise program- gym at work and home [place] reviewed- no contraindications noted. 3 months of phentermine provided.     FOLLOW-UP:  Return in about 4 weeks (around 03/14/2017) for Medical Weight Loss.

## 2017-03-20 ENCOUNTER — Ambulatory Visit: Payer: BLUE CROSS/BLUE SHIELD | Admitting: Family Medicine

## 2017-03-22 ENCOUNTER — Encounter: Payer: Self-pay | Admitting: Family Medicine

## 2017-03-22 ENCOUNTER — Ambulatory Visit (INDEPENDENT_AMBULATORY_CARE_PROVIDER_SITE_OTHER): Payer: BLUE CROSS/BLUE SHIELD | Admitting: Family Medicine

## 2017-03-22 VITALS — BP 136/84 | HR 77 | Ht 61.5 in | Wt 278.6 lb

## 2017-03-22 DIAGNOSIS — L989 Disorder of the skin and subcutaneous tissue, unspecified: Secondary | ICD-10-CM | POA: Diagnosis not present

## 2017-03-22 DIAGNOSIS — E559 Vitamin D deficiency, unspecified: Secondary | ICD-10-CM | POA: Diagnosis not present

## 2017-03-22 DIAGNOSIS — J3089 Other allergic rhinitis: Secondary | ICD-10-CM

## 2017-03-22 DIAGNOSIS — Z6841 Body Mass Index (BMI) 40.0 and over, adult: Secondary | ICD-10-CM | POA: Diagnosis not present

## 2017-03-22 MED ORDER — MONTELUKAST SODIUM 10 MG PO TABS: 10.0000 mg | ORAL_TABLET | Freq: Every day | ORAL | 3 refills | Status: DC

## 2017-03-22 NOTE — Patient Instructions (Signed)
Goals is to lose 8lbs in next 4 wks  - cont to do 54min exercise daily
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
## 2017-04-27 ENCOUNTER — Ambulatory Visit (INDEPENDENT_AMBULATORY_CARE_PROVIDER_SITE_OTHER): Payer: BLUE CROSS/BLUE SHIELD | Admitting: Family Medicine

## 2017-04-27 ENCOUNTER — Encounter: Payer: Self-pay | Admitting: Family Medicine

## 2017-04-27 VITALS — BP 113/77 | HR 90 | Ht 62.0 in | Wt 270.0 lb

## 2017-04-27 DIAGNOSIS — M722 Plantar fascial fibromatosis: Secondary | ICD-10-CM | POA: Insufficient documentation

## 2017-04-27 DIAGNOSIS — E66813 Obesity, class 3: Secondary | ICD-10-CM | POA: Insufficient documentation

## 2017-04-27 NOTE — Patient Instructions (Signed)
Please wear supportive shoes such as finn comforts or [place]   Plantar Fasciitis Plantar fasciitis is a painful foot condition that affects the heel. It occurs when the band of tissue that connects the toes to the heel bone (plantar fascia) becomes irritated. This can happen after exercising too much or doing other repetitive activities (overuse injury). The pain from plantar fasciitis can range from mild irritation to severe pain that makes it difficult for you to walk or move. The pain is usually worse in the morning or after you have been sitting or lying down for a while. What are the causes? This condition may be caused by:  Standing for long periods of time.  Wearing shoes that do not fit.  Doing high-impact activities, including running, aerobics, and ballet.  Being overweight.  Having an abnormal way of walking (gait).  Having tight calf muscles.  Having high arches in your feet.  Starting a new athletic activity.  What are the signs or symptoms? The main symptom of this condition is heel pain. Other symptoms include:  Pain that gets worse after activity or exercise.  Pain that is worse in the morning or after resting.  Pain that goes away after you walk for a few minutes.  How is this diagnosed? This condition may be diagnosed based on your signs and symptoms. Your health care provider will also do a physical exam to check for:  A tender area on the bottom of your foot.  A high arch in your foot.  Pain when you move your foot.  Difficulty moving your foot.  You may also need to have imaging studies to confirm the diagnosis. These can include:  X-rays.  Ultrasound.  MRI.  How is this treated? Treatment for plantar fasciitis depends on the severity of the condition. Your treatment may include:  Rest, ice, and over-the-counter pain medicines to manage your pain.  Exercises to stretch your calves and your plantar fascia.  A splint that holds your foot in  a stretched, upward position while you sleep (night splint).  Physical therapy to relieve symptoms and prevent problems in the future.  Cortisone injections to relieve severe pain.  Extracorporeal shock wave therapy (ESWT) to stimulate damaged plantar fascia with electrical impulses. It is often used as a last resort before surgery.  Surgery, if other treatments have not worked after 12 months.  Follow these instructions at home:  Take medicines only as directed by your health care provider.  Avoid activities that cause pain.  Roll the bottom of your foot over a bag of ice or a bottle of cold water. Do this for 20 minutes, 3-4 times a day.  Perform simple stretches as directed by your health care provider.  Try wearing athletic shoes with air-sole or gel-sole cushions or soft shoe inserts.  Wear a night splint while sleeping, if directed by your health care provider.  Keep all follow-up appointments with your health care provider. How is this prevented?  Do not perform exercises or activities that cause heel pain.  Consider finding low-impact activities if you continue to have problems.  Lose weight if you need to. The best way to prevent plantar fasciitis is to avoid the activities that aggravate your plantar fascia. Contact a health care provider if:  Your symptoms do not go away after treatment with home care measures.  Your pain gets worse.  Your pain affects your ability to move or do your daily activities. This information is not intended to replace  advice given to you by your health care provider. Make sure you discuss any questions you have with your health care provider. Document Released: 09/28/2000 Document Revised: 06/08/2015 Document Reviewed: 11/13/2013 Elsevier Interactive Patient Education  [employer].

## 2017-04-27 NOTE — Progress Notes (Signed)
Wt loss OV note   Impression and Recommendations:    1. Plantar fascia syndrome- R    2. Obesity, Class III, BMI 40-49.9 (morbid obesity) ([place])     1. Plantar Fasciitis of Right Foot - Advised patient not to walk around barefoot.  She should be wearing arch-supportive shoes at all times.  - She may take NSAID's for pain, but stretching and icing are the two most important avenues of relief.  - Put a towel down and frozen bottle of water, and roll her foot on the bottle 15 minutes 2-4 times per day.  - Handout on plantar fasciitis provided today.  2. Weight Mgt: - Patient is doing extremely well.  Reviewed her progress today.  Progress Started weighing in at 326 lbs, now at 270. Total weight loss of between 57 and 61 lbs.  - Continue staying under her calorie budget, consuming protein as half (50%) of her diet. - Continue on phentermine as prescribed.  - Reminded the patient that as her weight loss progresses, she will eventually slow down.  When that happens, we may need to bump up her exercise level, or help her get stricter with her carb restriction and up her protein consumption.  - Advised patient to continue exercising to improve health, incorporating more cardio and weight training into her regime.  Emphasized that building muscle tone and firming muscles will help reduce the appearance of loose skin.  - Recommended that the patient eventually strive for at least 150 minutes of cardiovascular activity per week according to guidelines established by the [facility].   - Healthy dietary habits encouraged, including low-carb, and high amounts of lean protein in diet.  She should continue reducing her carb intake while consuming meals that consist of at least 50% protein.  - Advised patient to practice self-love, positive self-talk, and keep encouraging herself to continue.  Phentermine Use - Patient continues on phentermine.  Advised the patient that a drug holiday  is recommended since she has been on it since 11/282018.  Patient wishes to continue Phentermine at this time and declined cessation.  - Risks of phentermine use discussed with the patient again today; she has educated herself extensively at home.  - Reviewed with the patient today that long-term use of phentermine can cause the body to habituate to the medication, causing more side-effects when eventually coming off of the medication.  3. General Health Maintenance - Patient should be sure to consume adequate amounts of water - half of body weight in oz of water per day.  Explained to patient what BMI refers to, and what it means medically.    Told patient to think about it as a "medical risk stratification measurement" and how increasing BMI is associated with increasing risk/ or worsening state of various diseases such as hypertension, hyperlipidemia, diabetes, premature OA, depression etc.  American Heart Association guidelines for healthy diet, basically Mediterranean diet, and exercise guidelines of 30 minutes 5 days per week or more discussed in detail.  - Reminded patient the need for yearly complete physical exam office visits in addition to office visits for management of the chronic diseases   Education and routine counseling performed. Handouts provided.  4. Follow-Up - Goals reviewed with the patient. - Patient will lose 6 lbs in 4 weeks.  - Will return for regular follow-up to monitor her weight loss.   No orders of the defined types were placed in this encounter.   No orders of the defined  types were placed in this encounter.   The patient was counseled, risk factors were discussed, anticipatory guidance given.  -Gross side effects, risk and benefits, and alternatives of medications discussed with patient.  Patient is aware that all medications have potential side effects and we are unable to predict every side effect or drug-drug interaction that may occur.   Expresses verbal understanding and consents to current therapy plan and treatment regimen.   Return in about 1 month (around 05/25/2017) for 6 lb wt loss goal.   Please see AVS handed out to patient at the end of our visit for further patient instructions/ counseling done pertaining to today's office visit.    Note: This document was prepared using Dragon voice recognition software and may include unintentional dictation errors.    This document serves as a record of services personally performed by Mellody Dance, DO. It was created on her behalf by Toni Amend, a trained medical scribe. The creation of this record is based on the scribe's personal observations and the provider's statements to them.   I have reviewed the above medical documentation for accuracy and completeness and I concur.  Mellody Dance 05/07/17 7:37 PM   ______________________________________________________________________    Subjective:  HPI: [place] y.o. female  presents for 3 month follow up for multiple medical problems.  Plantar Fasciatus  Wears Sketchers with foam at work, but no designated arch support.   Weight Loss Has lost 9 more pounds since last visit.  Notes that she's sticking with the app and hitting about 1600-1700 calories per day, but still not hitting exactly what she needs.  This week has been stressful at work, but otherwise all is well.  Works from 6:30 AM to 8:30 PM most days, walking around.  Has had good energy and is sleeping well.  Patient notes that she feels like the biggest benefit is the Vitamin D.  After beginning Vitamin D, she started sleeping better, having more energy, achy bones were going away, and she began losing weight without trying.  Truly believes that the Vitamin D has really helped her begin losing weight.  Notes that she's struggled with her weight since age 89.  Hated her weight, tried everything she could to lose it, and then gave up -  especially after having babies.  Notes "now that I can lose, there's no going back for me."  Phentermine Use Denies irritability or mood disorder, denies trouble sleeping.  Denies any other adverse effect from it.  Thinks that the main thing phentermine does is curb her appetite.  Wishes to continue using it at this time.   Weight:  Wt Readings from Last 3 Encounters:  04/27/17 270 lb (122.5 kg)  03/22/17 278 lb 9.6 oz (126.4 kg)  02/14/17 289 lb 1.6 oz (131.1 kg)   BMI Readings from Last 3 Encounters:  04/27/17 49.38 kg/m  03/22/17 51.79 kg/m  02/14/17 53.74 kg/m   Lab Results  Component Value Date   HGBA1C 5.1 11/08/2016   HGBA1C 5.1 08/11/2015    Review of Systems: General:   No F/C, wt loss Pulm:   No DIB, SOB, pleuritic chest pain Card:  No CP, palpitations Abd:  No n/v/d or pain Ext:  No inc edema from baseline   Objective: Physical Exam: BP 113/77   Pulse 90   Ht 5\' 2"  (1.575 m)   Wt 270 lb (122.5 kg)   LMP 04/06/2017 (Approximate)   SpO2 99%   BMI 49.38 kg/m  Body mass  index is 49.38 kg/m. General: Well nourished, in no apparent distress. Eyes: PERRLA, EOMs, conjunctiva clr no swelling or erythema ENT/Mouth: Hearing appears normal.  Mucus Membranes Moist  Neck: Supple, no masses Resp: Respiratory effort- normal, ECTA B/L w/o W/R/R  Cardio: RRR w/o MRGs. Abdomen: no gross distention. Lymphatics:  Brisk peripheral pulses, less 2 sec cap RF, no gross edema  M-sk: Full ROM, 5/5 strength, normal gait.  Skin: Warm, dry without rashes, lesions, ecchymosis.  Neuro: Alert, Oriented Psych: Normal affect, Insight and Judgment appropriate.  Right Foot: Tenderness along medial aspect of heel and on dorsiflexion of the foot.   Current Medications:  Current Outpatient Medications on File Prior to Visit  Medication Sig Dispense Refill  . albuterol (PROVENTIL) (2.5 MG/3ML) 0.083% nebulizer solution Take 3 mLs (2.5 mg total) by nebulization every 6 (six) hours  as needed for wheezing or shortness of breath. 150 mL 1  . Cholecalciferol (VITAMIN D3) 5000 units CAPS Take 1 capsule by mouth daily.    . montelukast (SINGULAIR) 10 MG tablet Take 1 tablet (10 mg total) by mouth at bedtime. 90 tablet 3  . phentermine (ADIPEX-P) 37.5 MG tablet Take 1 tablet (37.5 mg total) by mouth daily before breakfast. 30 tablet 2  . Vitamin D, Ergocalciferol, (DRISDOL) 50000 units CAPS capsule Take 1 capsule (50,000 Units total) by mouth 2 (two) times a week. 24 capsule 4   No current facility-administered medications on file prior to visit.     Medical History:  Patient Active Problem List   Diagnosis Date Noted  . Obesity, Class III, BMI 40-49.9 (morbid obesity) ([place]) 04/27/2017  . Plantar fascia syndrome- R  04/27/2017  . Skin lesion of left lower extremity 03/22/2017  . Bad dreams 11/10/2015  . Vivid dream 11/10/2015  . Snoring 11/10/2015  . 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 Borderline diastolic hypertension 79/39/0300  . Hypertriglyceridemia without hypercholesterolemia 08/12/2015  . Low serum HDL 08/12/2015  . 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 Environmental and seasonal allergies 08/11/2015  . h/o Nephrolithiasis  08/11/2015  . H/O tubal ligation 08/11/2015  . Family history of diabetes mellitus 08/11/2015  . Family history of stroke 08/11/2015  . Family history of hypertension 08/11/2015  . Vitamin D deficiency 08/11/2015    Allergies:  Allergies  Allergen Reactions  . Bee Venom Anaphylaxis  . Iodine Dermatitis and Rash  . Other Anaphylaxis    ANT bites  . Latex Other (See Comments)  . Shellfish-Derived Products Palpitations and Rash     Family history-  Reviewed; changed as appropriate  Social history-  Reviewed; changed as appropriate

## 2017-05-31 ENCOUNTER — Ambulatory Visit: Payer: BLUE CROSS/BLUE SHIELD | Admitting: Family Medicine

## 2017-06-06 ENCOUNTER — Encounter: Payer: Self-pay | Admitting: Family Medicine

## 2017-06-06 ENCOUNTER — Ambulatory Visit (INDEPENDENT_AMBULATORY_CARE_PROVIDER_SITE_OTHER): Payer: BLUE CROSS/BLUE SHIELD | Admitting: Family Medicine

## 2017-06-06 VITALS — BP 135/83 | HR 80 | Ht 62.0 in | Wt 262.0 lb

## 2017-06-06 DIAGNOSIS — E559 Vitamin D deficiency, unspecified: Secondary | ICD-10-CM

## 2017-06-06 MED ORDER — VITAMIN D (ERGOCALCIFEROL) 1.25 MG (50000 UNIT) PO CAPS: 50000.0000 [IU] | ORAL_CAPSULE | ORAL | 4 refills | Status: DC

## 2017-06-06 MED ORDER — PHENTERMINE HCL 37.5 MG PO TABS: 37.5000 mg | ORAL_TABLET | Freq: Every day | ORAL | 2 refills | Status: DC

## 2017-06-06 NOTE — Progress Notes (Signed)
Impression and Recommendations:    1. Obesity, Class III, BMI 40-49.9 (morbid obesity) ([place])   2. Vitamin D deficiency     1. Obesity -Pt is down 8 lbs since last OV 04-27-17. She is down 74 lbs overall since Nov 2018.  -continue your diet and exercise. -Discussed antiinflammatory diet. -Refill phentermine. -Goal: lose 8 lbs. 2. Vit D deficiency -continue supplementation. Refill given.   No orders of the defined types were placed in this encounter.   Meds ordered this encounter  Medications  . phentermine (ADIPEX-P) 37.5 MG tablet    Sig: Take 1 tablet (37.5 mg total) by mouth daily before breakfast.    Dispense:  30 tablet    Refill:  2  . Vitamin D, Ergocalciferol, (DRISDOL) 50000 units CAPS capsule    Sig: Take 1 capsule (50,000 Units total) by mouth 2 (two) times a week.    Dispense:  24 capsule    Refill:  4    Needs appt    Gross side effects, risk and benefits, and alternatives of medications and treatment plan in general discussed with patient.  Patient is aware that all medications have potential side effects and we are unable to predict every side effect or drug-drug interaction that may occur.   Patient will call with any questions prior to using medication if they have concerns.  Expresses verbal understanding and consents to current therapy and treatment regimen.  No barriers to understanding were identified.  Red flag symptoms and signs discussed in detail.  Patient expressed understanding regarding what to do in case of emergency\urgent symptoms  Please see AVS handed out to patient at the end of our visit for further patient instructions/ counseling done pertaining to today's office visit.   Return in about 1 month (around 07/04/2017).    Note: This note was prepared with assistance of Dragon voice recognition software. Occasional wrong-word or sound-a-like substitutions may have occurred due to the inherent limitations of voice recognition  software.  This document serves as a record of services personally performed by Mellody Dance, DO. It was created on her behalf by Mayer Masker, a trained medical scribe. The creation of this record is based on the scribe's personal observations and the provider's statements to them.   I have reviewed the above medical documentation for accuracy and completeness and I concur.  Mellody Dance 06/08/17 9:19 AM   --------------------------------------------------------------------------------------------------------------------------------------------------------------------------------------------------------------------------------------------    Subjective:     HPI: Abigail Cruz is a 52 y.o. female who presents to [place] at [facility] today for Fup of wt loss.  Weight loss She is down 8 lbs since last month (74 lbs since Nov 2018). She has not changed her diet at all, but she tracks her foods and stays under 2200 calories (usually 1500-1600 per day). Her diet consists of high protein and reduced portions of carbs and fatty foods. She feels bad when she eats sweets/processed foods.   She is walking at work on the treadmill 2-3 times a week during lunch. She is having a lot of sex as well.   She is taking phentermine 1 qd.   She feels she is not yo-yoing in her diet and is confident she can continue with this.   Vit D She believes her additional vitamin D supplementation has significantly improved her weight loss journey due to increased energy. She states she was feeling many symptoms related to menopause which has all resided since starting her supplements. She  can tell when she is due to take her twice weekly supplements.     Wt Readings from Last 3 Encounters:  06/06/17 262 lb (118.8 kg)  04/27/17 270 lb (122.5 kg)  03/22/17 278 lb 9.6 oz (126.4 kg)   BP Readings from Last 3 Encounters:  06/06/17 135/83  04/27/17 113/77  03/22/17 136/84   Pulse  Readings from Last 3 Encounters:  06/06/17 80  04/27/17 90  03/22/17 77   BMI Readings from Last 3 Encounters:  06/06/17 47.92 kg/m  04/27/17 49.38 kg/m  03/22/17 51.79 kg/m     Patient Care Team    Relationship Specialty Notifications Start End  Mellody Dance, DO PCP - General Family Medicine  08/10/15      Patient Active Problem List   Diagnosis Date Noted  . Obesity, Class III, BMI 40-49.9 (morbid obesity) ([place]) 04/27/2017  . Plantar fascia syndrome- R  04/27/2017  . Skin lesion of left lower extremity 03/22/2017  . Bad dreams 11/10/2015  . Vivid dream 11/10/2015  . Snoring 11/10/2015  . 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 Borderline diastolic hypertension 78/29/5621  . Hypertriglyceridemia without hypercholesterolemia 08/12/2015  . Low serum HDL 08/12/2015  . 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 Environmental and seasonal allergies 08/11/2015  . h/o Nephrolithiasis  08/11/2015  . H/O tubal ligation 08/11/2015  . Family history of diabetes mellitus 08/11/2015  . Family history of stroke 08/11/2015  . Family history of hypertension 08/11/2015  . Vitamin D deficiency 08/11/2015    Past Medical history, Surgical history, Family history, Social history, Allergies and Medications have been entered into the medical record, reviewed and changed as needed.    Current Meds  Medication Sig  . albuterol (PROVENTIL) (2.5 MG/3ML) 0.083% nebulizer solution Take 3 mLs (2.5 mg total) by nebulization every 6 (six) hours as needed for wheezing or shortness of breath.  . Cholecalciferol (VITAMIN D3) 5000 units CAPS Take 1 capsule by mouth daily.  . montelukast (SINGULAIR) 10 MG tablet Take 1 tablet (10 mg total) by mouth at bedtime.  . phentermine (ADIPEX-P) 37.5 MG tablet Take 1 tablet (37.5 mg total) by mouth daily before breakfast.  . Vitamin D, Ergocalciferol, (DRISDOL) 50000 units CAPS capsule Take 1 capsule (50,000 Units total) by mouth 2  (two) times a week.  . [DISCONTINUED] phentermine (ADIPEX-P) 37.5 MG tablet Take 1 tablet (37.5 mg total) by mouth daily before breakfast.  . [DISCONTINUED] Vitamin D, Ergocalciferol, (DRISDOL) 50000 units CAPS capsule Take 1 capsule (50,000 Units total) by mouth 2 (two) times a week.    Allergies:  Allergies  Allergen Reactions  . Bee Venom Anaphylaxis  . Iodine Dermatitis and Rash  . Other Anaphylaxis    ANT bites  . Latex Other (See Comments)  . Shellfish-Derived Products Palpitations and Rash     Review of Systems:  A fourteen system review of systems was performed and found to be positive as per HPI.   Objective:   Blood pressure 135/83, pulse 80, height 5\' 2"  (1.575 m), weight 262 lb (118.8 kg), SpO2 99 %. Body mass index is 47.92 kg/m. General:  Well Developed, well nourished, appropriate for stated age.  Neuro:  Alert and oriented,  extra-ocular muscles intact  HEENT:  Normocephalic, atraumatic, neck supple, no carotid bruits appreciated  Skin:  no gross rash, warm, pink. Cardiac:  RRR, S1 S2 Respiratory:  ECTA B/L and A/P, Not using accessory muscles, speaking in full sentences- unlabored. Vascular:  Ext warm, no cyanosis  apprec.; cap RF less 2 sec. Psych:  No HI/SI, judgement and insight good, Euthymic mood. Full Affect.

## 2017-06-06 NOTE — Patient Instructions (Signed)
Goal  8 lbs

## 2017-06-29 ENCOUNTER — Other Ambulatory Visit: Payer: Self-pay | Admitting: Family Medicine

## 2017-06-29 DIAGNOSIS — E559 Vitamin D deficiency, unspecified: Secondary | ICD-10-CM

## 2017-07-04 ENCOUNTER — Ambulatory Visit: Payer: BLUE CROSS/BLUE SHIELD | Admitting: Family Medicine

## 2017-07-10 ENCOUNTER — Ambulatory Visit: Payer: BLUE CROSS/BLUE SHIELD | Admitting: Family Medicine

## 2017-07-12 ENCOUNTER — Ambulatory Visit: Payer: BLUE CROSS/BLUE SHIELD | Admitting: Family Medicine

## 2017-07-13 ENCOUNTER — Encounter: Payer: Self-pay | Admitting: Family Medicine

## 2017-07-13 ENCOUNTER — Ambulatory Visit (INDEPENDENT_AMBULATORY_CARE_PROVIDER_SITE_OTHER): Payer: BLUE CROSS/BLUE SHIELD | Admitting: Family Medicine

## 2017-07-13 VITALS — BP 128/85 | HR 87 | Ht 62.0 in | Wt 262.0 lb

## 2017-07-13 NOTE — Progress Notes (Signed)
Impression and Recommendations:    1. Obesity, Class III, BMI 40-49.9 (morbid obesity) ([place])     1. Obesity -Weight remains the same from last OV.  -encouraged regular exercise. Prudent diet encouraged as well.   - pt declined need for med RF of phenteramine  -get adequate amounts of sleep. Handouts/information on proper sleep hygiene.  Goal: lose 6 lb since next month.   -If you are not meeting your weight loss goal within the next few weeks, track your sleep schedule.    Gross side effects, risk and benefits, and alternatives of medications and treatment plan in general discussed with patient.  Patient is aware that all medications have potential side effects and we are unable to predict every side effect or drug-drug interaction that may occur.   Patient will call with any questions prior to using medication if they have concerns.  Expresses verbal understanding and consents to current therapy and treatment regimen.  No barriers to understanding were identified.  Red flag symptoms and signs discussed in detail.  Patient expressed understanding regarding what to do in case of emergency\urgent symptoms  Please see AVS handed out to patient at the end of our visit for further patient instructions/ counseling done pertaining to today's office visit.   Return for 4 weeks, goal 6 pounds.    Note: This note was prepared with assistance of Dragon voice recognition software. Occasional wrong-word or sound-a-like substitutions may have occurred due to the inherent limitations of voice recognition software.  This document serves as a record of services personally performed by Mellody Dance, DO. It was created on her behalf by Mayer Masker, a trained medical scribe. The creation of this record is based on the scribe's personal observations and the provider's statements to them.   I have reviewed the above medical documentation for accuracy and completeness and I concur.  Mellody Dance 07/13/17 5:15 PM  --------------------------------------------------------------------------------------------------------------------------------------------------------------------------------------------------------------------------------------------    Subjective:     HPI: Abigail Cruz is a 52 y.o. female who presents to [place] at [facility] today for wt loss.   Weight is the same since last OV 06-06-17, but she states her scale has been lower than in office today.   She is still tracking her app "from time to time", but she eats the same foods so she does not need to track as diligently.   She is not exercising for herself. She states she has been busy at work and has been sleeping reduced hours. She feels good on a high protein diet.   She has been taking phentermine.     Wt Readings from Last 3 Encounters:  07/13/17 262 lb (118.8 kg)  06/06/17 262 lb (118.8 kg)  04/27/17 270 lb (122.5 kg)   BP Readings from Last 3 Encounters:  07/13/17 128/85  06/06/17 135/83  04/27/17 113/77   Pulse Readings from Last 3 Encounters:  07/13/17 87  06/06/17 80  04/27/17 90   BMI Readings from Last 3 Encounters:  07/13/17 47.92 kg/m  06/06/17 47.92 kg/m  04/27/17 49.38 kg/m     Patient Care Team    Relationship Specialty Notifications Start End  Mellody Dance, DO PCP - General Family Medicine  08/10/15      Patient Active Problem List   Diagnosis Date Noted  . Obesity, Class III, BMI 40-49.9 (morbid obesity) ([place]) 04/27/2017  . Plantar fascia syndrome- R  04/27/2017  . Skin lesion of left lower extremity 03/22/2017  .  Bad dreams 11/10/2015  . Vivid dream 11/10/2015  . Snoring 11/10/2015  . 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 Borderline diastolic hypertension 33/00/7622  . Hypertriglyceridemia without hypercholesterolemia 08/12/2015  . Low serum HDL 08/12/2015  . 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  Environmental and seasonal allergies 08/11/2015  . h/o Nephrolithiasis  08/11/2015  . H/O tubal ligation 08/11/2015  . Family history of diabetes mellitus 08/11/2015  . Family history of stroke 08/11/2015  . Family history of hypertension 08/11/2015  . Vitamin D deficiency 08/11/2015    Past Medical history, Surgical history, Family history, Social history, Allergies and Medications have been entered into the medical record, reviewed and changed as needed.    Current Meds  Medication Sig  . albuterol (PROVENTIL) (2.5 MG/3ML) 0.083% nebulizer solution Take 3 mLs (2.5 mg total) by nebulization every 6 (six) hours as needed for wheezing or shortness of breath.  . Cholecalciferol (VITAMIN D3) 5000 units CAPS Take 1 capsule by mouth daily.  . montelukast (SINGULAIR) 10 MG tablet Take 1 tablet (10 mg total) by mouth at bedtime.  . phentermine (ADIPEX-P) 37.5 MG tablet Take 1 tablet (37.5 mg total) by mouth daily before breakfast.  . Vitamin D, Ergocalciferol, (DRISDOL) 50000 units CAPS capsule Take 1 capsule (50,000 Units total) by mouth 2 (two) times a week.    Allergies:  Allergies  Allergen Reactions  . Bee Venom Anaphylaxis  . Iodine Dermatitis and Rash  . Other Anaphylaxis    ANT bites  . Latex Other (See Comments)  . Shellfish-Derived Products Palpitations and Rash     Review of Systems:  A fourteen system review of systems was performed and found to be positive as per HPI.   Objective:   Blood pressure 128/85, pulse 87, height 5\' 2"  (1.575 m), weight 262 lb (118.8 kg), SpO2 98 %. Body mass index is 47.92 kg/m. General:  Well Developed, well nourished, appropriate for stated age.  Neuro:  Alert and oriented,  extra-ocular muscles intact  HEENT:  Normocephalic, atraumatic, neck supple, no carotid bruits appreciated  Skin:  no gross rash, warm, pink. Cardiac:  RRR, S1 S2 Respiratory:  ECTA B/L and A/P, Not using accessory muscles, speaking in full sentences-  unlabored. Vascular:  Ext warm, no cyanosis apprec.; cap RF less 2 sec. Psych:  No HI/SI, judgement and insight good, Euthymic mood. Full Affect.

## 2017-08-17 ENCOUNTER — Ambulatory Visit: Payer: BLUE CROSS/BLUE SHIELD | Admitting: Family Medicine

## 2017-08-29 ENCOUNTER — Encounter: Payer: Self-pay | Admitting: Family Medicine

## 2017-08-29 ENCOUNTER — Ambulatory Visit: Payer: BLUE CROSS/BLUE SHIELD | Admitting: Family Medicine

## 2017-08-29 VITALS — BP 133/85 | HR 83 | Ht 62.0 in | Wt 255.6 lb

## 2017-08-29 MED ORDER — PHENTERMINE HCL 37.5 MG PO TABS: 37.5000 mg | ORAL_TABLET | Freq: Every day | ORAL | 2 refills | Status: DC

## 2017-08-29 NOTE — Progress Notes (Signed)
Wt loss OV note   Impression and Recommendations:    1. Obesity, Class III, BMI 40-49.9 (morbid obesity) ([place])     1. Weight Mgt: Explained to patient what BMI refers to, and what it means medically.    Told patient to think about it as a "medical risk stratification measurement" and how increasing BMI is associated with increasing risk/ or worsening state of various diseases such as hypertension, hyperlipidemia, diabetes, premature OA, depression etc.  American Heart Association guidelines for healthy diet, basically Mediterranean diet, and exercise guidelines of 30 minutes 5 days per week or more discussed in detail.  -Reminded patient the need for yearly complete physical exam office visits in addition to office visits for management of the chronic diseases  - Phentermine refilled today.  Reviewed risks and benefits of ongoing phentermine use at length with patient today.  2. Lifestyle & Preventative Health Maintenance - Advised patient to continue working toward exercising to improve overall mental, physical, and emotional health.    - Encouraged patient to engage in daily physical activity, especially a formal exercise routine.    - Recommended that the patient eventually strive for at least 150 minutes of moderate cardiovascular activity per week according to guidelines established by the [facility].   - Healthy dietary habits encouraged, including low-carb, and high amounts of lean protein in diet.   - Patient should also consume adequate amounts of water - half of body weight in oz of water per day.  3. Follow-Up - Return for weight loss check-in in four weeks. - Goal for 4 weeks = 6 pounds weight loss.  - Otherwise, continue to return for regularly scheduled chronic follow-up, and acute concerns PRN.   Education and routine counseling performed. Handouts provided.  Meds ordered this encounter  Medications  . phentermine (ADIPEX-P) 37.5 MG tablet    Sig: Take 1 tablet  (37.5 mg total) by mouth daily before breakfast.    Dispense:  30 tablet    Refill:  2    The patient was counseled, risk factors were discussed, anticipatory guidance given.  -Gross side effects, risk and benefits, and alternatives of medications discussed with patient.  Patient is aware that all medications have potential side effects and we are unable to predict every side effect or drug-drug interaction that may occur.  Expresses verbal understanding and consents to current therapy plan and treatment regimen.   Return for 6lbs in 4 wks is GOAL- you go girl!!!.   Please see AVS handed out to patient at the end of our visit for further patient instructions/ counseling done pertaining to today's office visit.    Note: This document was prepared using Dragon voice recognition software and may include unintentional dictation errors.    This document serves as a record of services personally performed by Mellody Dance, DO. It was created on her behalf by Toni Amend, a trained medical scribe. The creation of this record is based on the scribe's personal observations and the provider's statements to them.   I have reviewed the above medical documentation for accuracy and completeness and I concur.  Mellody Dance 08/29/17 5:02 PM   ________________________________________________________    Subjective:  HPI: [place] y.o. female  presents for 3 month follow up for multiple medical problems.  Doing well.  She is down to 255 lbs from 262 lbs.  Notes her highest weight at 335 lbs.  Her stress at home has not resolved yet.  Thinks that everything will  be resolved over the next two weeks.  Still using phentermine.  She's on her last month, but has enjoyed her progress on phentermine so far.  Notes "I've gotta get rid of this [weight], I've carried it for too long but I've got hope now."  Notes that carrying this weight for all these years has been very discouraging  and disheartening.  She hasn't been exercising as often as she'd like to, mostly due to work.  Work is very stressful and she keeps erratic hours.  Notes she has early morning meetings often, starting sometimes even at 4:30 AM.  Sometimes she has early mornings in the same day as late evening meetings.   Weight:  Wt Readings from Last 3 Encounters:  08/29/17 255 lb 9.6 oz (115.9 kg)  07/13/17 262 lb (118.8 kg)  06/06/17 262 lb (118.8 kg)   BMI Readings from Last 3 Encounters:  08/29/17 46.75 kg/m  07/13/17 47.92 kg/m  06/06/17 47.92 kg/m   Lab Results  Component Value Date   HGBA1C 5.1 11/08/2016   HGBA1C 5.1 08/11/2015    Review of Systems: General:   No F/C, wt loss Pulm:   No DIB, SOB, pleuritic chest pain Card:  No CP, palpitations Abd:  No n/v/d or pain Ext:  No inc edema from baseline   Objective: Physical Exam: BP 133/85   Pulse 83   Ht 5\' 2"  (1.575 m)   Wt 255 lb 9.6 oz (115.9 kg)   SpO2 99%   BMI 46.75 kg/m  Body mass index is 46.75 kg/m. General: Well nourished, in no apparent distress. Eyes: PERRLA, EOMs, conjunctiva clr no swelling or erythema ENT/Mouth: Hearing appears normal.  Mucus Membranes Moist  Neck: Supple, no masses Resp: Respiratory effort- normal, ECTA B/L w/o W/R/R  Cardio: RRR w/o MRGs. Abdomen: no gross distention. Lymphatics:  Brisk peripheral pulses, less 2 sec cap RF, no gross edema  M-sk: Full ROM, 5/5 strength, normal gait.  Skin: Warm, dry without rashes, lesions, ecchymosis.  Neuro: Alert, Oriented Psych: Normal affect, Insight and Judgment appropriate.    Current Medications:  Current Outpatient Medications on File Prior to Visit  Medication Sig Dispense Refill  . albuterol (PROVENTIL) (2.5 MG/3ML) 0.083% nebulizer solution Take 3 mLs (2.5 mg total) by nebulization every 6 (six) hours as needed for wheezing or shortness of breath. 150 mL 1  . Cholecalciferol (VITAMIN D3) 5000 units CAPS Take 1 capsule by mouth daily.      . montelukast (SINGULAIR) 10 MG tablet Take 1 tablet (10 mg total) by mouth at bedtime. 90 tablet 3  . Vitamin D, Ergocalciferol, (DRISDOL) 50000 units CAPS capsule Take 1 capsule (50,000 Units total) by mouth 2 (two) times a week. 24 capsule 4   No current facility-administered medications on file prior to visit.     Medical History:  Patient Active Problem List   Diagnosis Date Noted  . Obesity, Class III, BMI 40-49.9 (morbid obesity) ([place]) 04/27/2017  . Plantar fascia syndrome- R  04/27/2017  . Skin lesion of left lower extremity 03/22/2017  . Bad dreams 11/10/2015  . Vivid dream 11/10/2015  . Snoring 11/10/2015  . 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 Borderline diastolic hypertension 62/26/3335  . Hypertriglyceridemia without hypercholesterolemia 08/12/2015  . Low serum HDL 08/12/2015  . 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 Environmental and seasonal allergies 08/11/2015  . h/o Nephrolithiasis  08/11/2015  . H/O tubal ligation 08/11/2015  . Family history of diabetes mellitus 08/11/2015  .  Family history of stroke 08/11/2015  . Family history of hypertension 08/11/2015  . Vitamin D deficiency 08/11/2015    Allergies:  Allergies  Allergen Reactions  . Bee Venom Anaphylaxis  . Iodine Dermatitis and Rash  . Other Anaphylaxis    ANT bites  . Latex Other (See Comments)  . Shellfish-Derived Products Palpitations and Rash     Family history-  Reviewed; changed as appropriate  Social history-  Reviewed; changed as appropriate

## 2017-08-29 NOTE — Patient Instructions (Signed)

## 2017-10-03 ENCOUNTER — Ambulatory Visit: Payer: BLUE CROSS/BLUE SHIELD | Admitting: Family Medicine

## 2017-10-11 ENCOUNTER — Ambulatory Visit: Payer: BLUE CROSS/BLUE SHIELD | Admitting: Family Medicine

## 2017-10-23 ENCOUNTER — Telehealth: Payer: Self-pay | Admitting: Family Medicine

## 2017-10-23 NOTE — Telephone Encounter (Signed)
Please let her know that since she is already being treated by the dentist for this infection with antibiotics, she will need to get the pain meds for it from them as well.  Tell her I am sorry and I hope that she feels better really soon.

## 2017-10-23 NOTE — Telephone Encounter (Signed)
Patient called states tooth pain & already taking antibiotic but is request pain meds--- Forwarding request to medical assistant to review with provider & contact patient with decision.  --glh

## 2017-10-23 NOTE — Telephone Encounter (Signed)
Called patient no answer and unable to leave message. MPulliam, CMA/RT(R)

## 2017-10-23 NOTE — Telephone Encounter (Signed)
Patient has appointment with dentist next Monday 10/30/2017 to have the tooth removed.  Patient states that she is on an antibiotic for the infection, however is in pain and requesting medication to help.  Please review and advise. MPulliam, CMA/RT(R)

## 2017-10-24 NOTE — Telephone Encounter (Signed)
Called patient unable to leave message. MPulliam, CMA/RT(R)  

## 2017-10-25 NOTE — Telephone Encounter (Signed)
Spoke to patient's husband, she has been able to get worked in with her dentist for urgent care. MPulliam, CMA/RT(R)

## 2017-11-02 ENCOUNTER — Emergency Department (HOSPITAL_COMMUNITY)
Admission: EM | Admit: 2017-11-02 | Discharge: 2017-11-02 | Disposition: A | Discharge status: Home / Self Care | Payer: BLUE CROSS/BLUE SHIELD | Attending: Emergency Medicine | Admitting: Emergency Medicine

## 2017-11-02 ENCOUNTER — Other Ambulatory Visit: Payer: Self-pay

## 2017-11-02 ENCOUNTER — Emergency Department (HOSPITAL_COMMUNITY): Payer: BLUE CROSS/BLUE SHIELD

## 2017-11-02 ENCOUNTER — Encounter (HOSPITAL_COMMUNITY): Payer: Self-pay | Admitting: Emergency Medicine

## 2017-11-02 ENCOUNTER — Ambulatory Visit: Payer: BLUE CROSS/BLUE SHIELD | Admitting: Family Medicine

## 2017-11-02 DIAGNOSIS — M545 Low back pain: Secondary | ICD-10-CM | POA: Diagnosis present

## 2017-11-02 DIAGNOSIS — Z79899 Other long term (current) drug therapy: Secondary | ICD-10-CM | POA: Insufficient documentation

## 2017-11-02 DIAGNOSIS — Z91040 Latex allergy status: Secondary | ICD-10-CM | POA: Diagnosis not present

## 2017-11-02 DIAGNOSIS — M549 Dorsalgia, unspecified: Secondary | ICD-10-CM

## 2017-11-02 DIAGNOSIS — N3000 Acute cystitis without hematuria: Secondary | ICD-10-CM | POA: Insufficient documentation

## 2017-11-02 DIAGNOSIS — M5127 Other intervertebral disc displacement, lumbosacral region: Secondary | ICD-10-CM

## 2017-11-02 DIAGNOSIS — M5106 Intervertebral disc disorders with myelopathy, lumbar region: Secondary | ICD-10-CM | POA: Diagnosis not present

## 2017-11-02 DIAGNOSIS — M5417 Radiculopathy, lumbosacral region: Secondary | ICD-10-CM | POA: Diagnosis not present

## 2017-11-02 LAB — BASIC METABOLIC PANEL
Anion gap: 9 (ref 5–15)
BUN: 15 mg/dL (ref 6–20)
CO2: 24 mmol/L (ref 22–32)
Calcium: 8.8 mg/dL — ABNORMAL LOW (ref 8.9–10.3)
Chloride: 102 mmol/L (ref 98–111)
Creatinine, Ser: 0.69 mg/dL (ref 0.44–1.00)
GFR calc Af Amer: >60 mL/min (ref >60)
GLUCOSE: 101 mg/dL — AB (ref 70–99)
POTASSIUM: 3.9 mmol/L (ref 3.5–5.1)
SODIUM: 135 mmol/L (ref 135–145)

## 2017-11-02 LAB — CBC WITH DIFFERENTIAL/PLATELET
ABS IMMATURE GRANULOCYTES: 0.06 10*3/uL (ref 0.00–0.07)
BASOS PCT: 1 %
Basophils Absolute: 0.1 10*3/uL (ref 0.0–0.1)
EOS ABS: 0.4 10*3/uL (ref 0.0–0.5)
EOS PCT: 3 %
HCT: 41.1 % (ref 36.0–46.0)
Hemoglobin: 13.2 g/dL (ref 12.0–15.0)
Immature Granulocytes: 1 %
Lymphocytes Relative: 19 %
Lymphs Abs: 2.2 10*3/uL (ref 0.7–4.0)
MCH: 28.2 pg (ref 26.0–34.0)
MCHC: 32.1 g/dL (ref 30.0–36.0)
MCV: 87.8 fL (ref 80.0–100.0)
MONO ABS: 1 10*3/uL (ref 0.1–1.0)
MONOS PCT: 9 %
NEUTROS ABS: 8.2 10*3/uL — AB (ref 1.7–7.7)
Neutrophils Relative %: 67 %
PLATELETS: 315 10*3/uL (ref 150–400)
RBC: 4.68 MIL/uL (ref 3.87–5.11)
RDW: 13.9 % (ref 11.5–15.5)
WBC: 12 10*3/uL — AB (ref 4.0–10.5)
nRBC: 0 % (ref 0.0–0.2)

## 2017-11-02 LAB — URINALYSIS, ROUTINE W REFLEX MICROSCOPIC
BILIRUBIN URINE: NEGATIVE
Glucose, UA: NEGATIVE mg/dL
Ketones, ur: 5 mg/dL — AB
NITRITE: NEGATIVE
PROTEIN: NEGATIVE mg/dL
Specific Gravity, Urine: 1.027 (ref 1.005–1.030)
pH: 5 (ref 5.0–8.0)

## 2017-11-02 LAB — I-STAT BETA HCG BLOOD, ED (MC, WL, AP ONLY)

## 2017-11-02 MED ORDER — METHYLPREDNISOLONE SODIUM SUCC 125 MG IJ SOLR
125.0000 mg | Freq: Once | INTRAMUSCULAR | Status: AC
  Administered 2017-11-02: 125 mg via INTRAVENOUS
  Filled 2017-11-02: qty 2

## 2017-11-02 MED ORDER — HYDROMORPHONE HCL 1 MG/ML IJ SOLN
1.0000 mg | Freq: Once | INTRAMUSCULAR | Status: AC
  Administered 2017-11-02: 1 mg via INTRAVENOUS
  Filled 2017-11-02: qty 1

## 2017-11-02 MED ORDER — METHYLPREDNISOLONE 4 MG PO TBPK: ORAL_TABLET | ORAL | 0 refills | Status: DC

## 2017-11-02 MED ORDER — CEPHALEXIN 500 MG PO CAPS: 1000.0000 mg | ORAL_CAPSULE | Freq: Two times a day (BID) | ORAL | 0 refills | Status: DC

## 2017-11-02 MED ORDER — ORPHENADRINE CITRATE 30 MG/ML IJ SOLN: 60.0000 mg | Freq: Two times a day (BID) | INTRAMUSCULAR | Status: DC

## 2017-11-02 MED ORDER — SODIUM CHLORIDE 0.9 % IV SOLN
1.0000 g | Freq: Once | INTRAVENOUS | Status: AC
  Administered 2017-11-02: 1 g via INTRAVENOUS
  Filled 2017-11-02: qty 10

## 2017-11-02 MED ORDER — DIAZEPAM 5 MG/ML IJ SOLN: 2.5000 mg | INTRAMUSCULAR | Status: DC | PRN

## 2017-11-02 MED ORDER — ORPHENADRINE CITRATE ER 100 MG PO TB12: 100.0000 mg | ORAL_TABLET | Freq: Two times a day (BID) | ORAL | 0 refills | Status: DC

## 2017-11-02 MED ORDER — OXYCODONE-ACETAMINOPHEN 5-325 MG PO TABS: 1.0000 | ORAL_TABLET | Freq: Four times a day (QID) | ORAL | 0 refills | Status: DC | PRN

## 2017-11-02 MED ORDER — KETOROLAC TROMETHAMINE 30 MG/ML IJ SOLN
30.0000 mg | Freq: Once | INTRAMUSCULAR | Status: AC
  Administered 2017-11-02: 30 mg via INTRAVENOUS
  Filled 2017-11-02: qty 1

## 2017-11-02 MED ORDER — ORPHENADRINE CITRATE ER 100 MG PO TB12
100.0000 mg | ORAL_TABLET | Freq: Once | ORAL | Status: AC
  Administered 2017-11-02: 100 mg via ORAL
  Filled 2017-11-02: qty 1

## 2017-11-02 NOTE — ED Notes (Signed)
Pt verbalized understanding of discharge paperwork, prescriptions and follow-up appts. Ambulatory on discharge.

## 2017-11-02 NOTE — ED Provider Notes (Signed)
[facility] EMER[facility]ENCY DEPARTMENT Provider Note   CSN: [id number] Arrival date & time: 11/02/17  0607     History   Chief Complaint Chief Complaint  Patient presents with  . Back Pain  . Hip Pain    HPI Abigail Cruz is a 52 y.o. female.  HPI Patient started developing left lower back pain 6 days ago.  It was localized to around her lower back, she indicates the SI region.  At onset it was moderate and responding to ibuprofen.  She thought it was improving a couple days ago but still present.  This morning she sat down on the toilet and got a sudden severe pain that went from her back down her leg to the knee.  Reports the pain was so severe, she urinated on the floor due to the amount of difficulty she had getting back up.  She reports that she had no associated abdominal pain.  She is able to bear weight and walk but with certain movements and positions the pain is debilitating.  She feels that she has had decreased volume of urine output for several days, feels like the urine is slow to come out.  She has been attributing it to dehydration.  She has been going to work and she thinks she is not drinking as much she should. Past Medical History:  Diagnosis Date  . Chronic kidney disease    kidney stones  . Pneumonia     Patient Active Problem List   Diagnosis Date Noted  . Obesity, Class III, BMI 40-49.9 (morbid obesity) ([place]) 04/27/2017  . Plantar fascia syndrome- R  04/27/2017  . Skin lesion of left lower extremity 03/22/2017  . Bad dreams 11/10/2015  . Vivid dream 11/10/2015  . Snoring 11/10/2015  . 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 Borderline diastolic hypertension 07/17/1599  . Hypertriglyceridemia without hypercholesterolemia 08/12/2015  . Low serum HDL 08/12/2015  . 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 Environmental and seasonal allergies 08/11/2015  . h/o Nephrolithiasis  08/11/2015  . H/O tubal ligation  08/11/2015  . Family history of diabetes mellitus 08/11/2015  . Family history of stroke 08/11/2015  . Family history of hypertension 08/11/2015  . Vitamin D deficiency 08/11/2015    Past Surgical History:  Procedure Laterality Date  . CHOLECYSTECTOMY    . TUBAL LIGATION       OB History   None      Home Medications    Prior to Admission medications   Medication Sig Start Date End Date Taking? Authorizing Provider  acetaminophen (TYLENOL) 325 MG tablet Take 975 mg by mouth once.   Yes [provider]  phentermine (ADIPEX-P) 37.5 MG tablet Take 1 tablet (37.5 mg total) by mouth daily before breakfast. 08/29/17  Yes Opalski, Neoma Laming, DO  tiZANidine (ZANAFLEX) 2 MG tablet Take 2 mg by mouth every 6 (six) hours as needed for muscle spasms.   Yes [provider]  Vitamin D, Ergocalciferol, (DRISDOL) 50000 units CAPS capsule Take 1 capsule (50,000 Units total) by mouth 2 (two) times a week. 06/08/17  Yes Opalski, Deborah, DO  albuterol (PROVENTIL) (2.5 MG/3ML) 0.083% nebulizer solution Take 3 mLs (2.5 mg total) by nebulization every 6 (six) hours as needed for wheezing or shortness of breath. Patient not taking: Reported on 11/02/2017 10/26/16   Mellody Dance, DO  cephALEXin (KEFLEX) 500 MG capsule Take 2 capsules (1,000 mg total) by mouth 2 (two) times daily. 11/02/17   Charlesetta Shanks, MD  methylPREDNISolone (MEDROL DOSEPAK) 4 MG TBPK tablet Take as per Dosepak instructions 11/02/17   Charlesetta Shanks, MD  montelukast (SINGULAIR) 10 MG tablet Take 1 tablet (10 mg total) by mouth at bedtime. Patient not taking: Reported on 11/02/2017 03/22/17   Mellody Dance, DO  orphenadrine (NORFLEX) 100 MG tablet Take 1 tablet (100 mg total) by mouth 2 (two) times daily. 11/02/17   Charlesetta Shanks, MD  oxyCODONE-acetaminophen (PERCOCET) 5-325 MG tablet Take 1-2 tablets by mouth every 6 (six) hours as needed. 11/02/17   Charlesetta Shanks, MD    Family History Family History    Problem Relation Age of Onset  . Hypertension Mother   . Diabetes Mother   . Cancer Mother        cervical  . Hypertension Father   . Cancer Maternal Aunt        breast  . Cancer Paternal Aunt        breast  . Heart attack Paternal Grandfather   . Stroke Paternal Grandfather     Social History Social History   Tobacco Use  . Smoking status: Never Smoker  . Smokeless tobacco: Never Used  Substance Use Topics  . Alcohol use: No  . Drug use: No     Allergies   Bee venom; Iodine; Other; Latex; and Shellfish-derived products   Review of Systems Review of Systems 10 Systems reviewed and are negative for acute change except as noted in the HPI.   Physical Exam Updated Vital Signs BP 138/90   Pulse 88   Temp 97.6 F (36.4 C) (Oral)   Resp 16   LMP 10/18/2017 Comment: tubes tied  SpO2 99%   Physical Exam  Constitutional: She is oriented to person, place, and time.  Patient appears to be in severe pain.  Alert and nontoxic.  No respiratory distress.  HENT:  Head: Normocephalic and atraumatic.  Eyes: EOM are normal.  Cardiovascular: Normal rate, regular rhythm, normal heart sounds and intact distal pulses.  Pulmonary/Chest: Effort normal and breath sounds normal.  Abdominal: Soft. She exhibits no distension. There is no tenderness. There is no guarding.  Musculoskeletal:  No reproducible pain in the back to palpation.  Over the left SI joint patient indicates that the area where the pain seems to originate.  No peripheral edema.  Calves are soft and nontender.  Neurological: She is alert and oriented to person, place, and time. No cranial nerve deficit. She exhibits normal muscle tone. Coordination normal.  Skin: Skin is warm and dry.  Psychiatric: She has a normal mood and affect.     ED Treatments / Results  Labs (all labs ordered are listed, but only abnormal results are displayed) Labs Reviewed  CBC WITH DIFFERENTIAL/PLATELET - Abnormal; Notable for the  following components:      Result Value   WBC 12.0 (*)    Neutro Abs 8.2 (*)    All other components within normal limits  BASIC METABOLIC PANEL - Abnormal; Notable for the following components:   Glucose, Bld 101 (*)    Calcium 8.8 (*)    All other components within normal limits  URINALYSIS, ROUTINE W REFLEX MICROSCOPIC - Abnormal; Notable for the following components:   APPearance HAZY (*)    Hgb urine dipstick SMALL (*)    Ketones, ur 5 (*)    Leukocytes, UA MODERATE (*)    Bacteria, UA RARE (*)    All other components within normal limits  URINE CULTURE  I-STAT BETA HCG BLOOD, ED ([facility],  [facility], [facility] ONLY)    EKG None  Radiology Mr Lumbar Spine Wo Contrast  Addendum Date: 11/02/2017   ADDENDUM REPORT: 11/02/2017 11:39 ADDENDUM: After additional review and clinical correlation with Dr. Sherley Bounds, note is made of an extraforaminal protrusion at L2-3 on the LEFT. See series 9, image 16. LEFT L2 nerve root irritation/displacement is likely. Electronically Signed   By: Staci Righter M.D.   On: 11/02/2017 11:39   Result Date: 11/02/2017 CLINICAL DATA:  Low back pain. LEFT leg pain. Onset several days ago. No urinary discomfort. EXAM: MRI LUMBAR SPINE WITHOUT CONTRAST TECHNIQUE: Multiplanar, multisequence MR imaging of the lumbar spine was performed. No intravenous contrast was administered. COMPARISON:  None. FINDINGS: Segmentation:  Standard. Alignment:  Slight anterolisthesis L4-5, 1-2 mm. Vertebrae:  No worrisome osseous lesion. Conus medullaris and cauda equina: Conus extends to the L1 level. Conus and cauda equina appear normal. Paraspinal and other soft tissues: Increased body habitus without features of epidural lipomatosis. Disc levels: L1-L2:  Normal. L2-L3:  Disc desiccation.  Facet arthropathy.  No protrusion. L3-L4: Disc space narrowing. Facet arthropathy and ligamentum flavum hypertrophy. Far-lateral and foraminal annular rent with protrusion on the LEFT. LEFT L4, possibly LEFT L3  neural impingement/irritation. L4-L5: 1-2 mm anterolisthesis. Disc space narrowing. Posterior element hypertrophy. Annular bulge. No protrusion. No impingement. L5-S1: Disc space narrowing. Facet arthropathy. Central protrusion/extrusion, caudally migrated fragment. This does not result in stenosis or contact either S1 nerve root, but is mildly eccentric to the LEFT. IMPRESSION: The dominant LEFT-sided abnormality appears to be at L3-4, where a far-lateral and foraminal annular rent with protrusion affects the LEFT L4, possibly LEFT L3 nerve roots. 1-2 mm facet mediated anterolisthesis L4-5, without impingement. Central protrusion/extrusion L5-S1 with caudally migrated fragment which does not result in stenosis or S1 nerve root impingement. Significance uncertain. Electronically Signed: By: Staci Righter M.D. On: 11/02/2017 09:18   Dg Hip Unilat W Or Wo Pelvis 2-3 Views Left  Result Date: 11/02/2017 CLINICAL DATA:  Left hip pain for 4 days.  No known injury. EXAM: DG HIP (WITH OR WITHOUT PELVIS) 2-3V LEFT COMPARISON:  None. FINDINGS: There is no evidence of hip fracture or dislocation. There is no evidence of arthropathy or other focal bone abnormality. IMPRESSION: Negative. Electronically Signed   By: Lucienne Capers M.D.   On: 11/02/2017 06:46    Procedures Procedures (including critical care time)  Medications Ordered in ED Medications  diazepam (VALIUM) injection 2.5 mg (has no administration in time range)  methylPREDNISolone sodium succinate (SOLU-MEDROL) 125 mg/2 mL injection 125 mg (125 mg Intravenous Given 11/02/17 0730)  HYDROmorphone (DILAUDID) injection 1 mg (1 mg Intravenous Given 11/02/17 0730)  ketorolac (TORADOL) 30 MG/ML injection 30 mg (30 mg Intravenous Given 11/02/17 0730)  orphenadrine (NORFLEX) 12 hr tablet 100 mg (100 mg Oral Given 11/02/17 0745)  HYDROmorphone (DILAUDID) injection 1 mg (1 mg Intravenous Given 11/02/17 0829)  HYDROmorphone (DILAUDID) injection 1 mg (1 mg  Intravenous Given 11/02/17 1036)  cefTRIAXone (ROCEPHIN) 1 g in sodium chloride 0.9 % 100 mL IVPB (0 g Intravenous Stopped 11/02/17 1215)     Initial Impression / Assessment and Plan / ED Course  I have reviewed the triage vital signs and the nursing notes.  Pertinent labs & imaging results that were available during my care of the patient were reviewed by me and considered in my medical decision making (see chart for details).  Clinical Course as of Nov 02 1224  Thu Nov 02, 2017  1032 Patient updated  on MRI results.  He continues to have severe pain.  She is up and standing due to severity of pain in the supine position.  Will add Dilaudid 1 mg IV.  She reports it is only giving very temporary improvement in pain.   [MP]  1032 Consult order placed for neurosurgery.   [MP]  0102 Consult: Reviewed with Dr. Ronnald Ramp neurosurgery.  Advises to continue trying to get pain control with Toradol, steroid and narcotics.  If patient cannot be pain controlled, admit to medical service with possible thought of IR epidural injection.  Neurosurgery will do consultation.   [MP]  1225 Consultation was done by Dr. Ronnald Ramp in the emergency department.  At this time, he does feel she is stable for outpatient management with follow-up in the office.   [MP]    Clinical Course User Index [MP] Charlesetta Shanks, MD   Patient's pain is typical for radiculopathy.  MRI confirms disc herniation as etiology.  Patient is able to ambulate and has intact strength of the lower extremities.  She also had urinary complaints.  Although specimen is contaminated, is suspicious for positive UTI.  Will treat empirically based on combination of symptoms and specimen with culture pending.  Patient is aware of the plan and follow-up plan with Dr. Ronnald Ramp.  Final Clinical Impressions(s) / ED Diagnoses   Final diagnoses:  Herniated nucleus pulposus of lumbosacral region  Lumbosacral radiculopathy  Intractable back pain  Acute cystitis  without hematuria    ED Discharge Orders         Ordered    oxyCODONE-acetaminophen (PERCOCET) 5-325 MG tablet  Every 6 hours PRN     11/02/17 1222    methylPREDNISolone (MEDROL DOSEPAK) 4 MG TBPK tablet     11/02/17 1222    orphenadrine (NORFLEX) 100 MG tablet  2 times daily     11/02/17 1222    cephALEXin (KEFLEX) 500 MG capsule  2 times daily     11/02/17 1224           Charlesetta Shanks, MD 11/02/17 1226

## 2017-11-02 NOTE — ED Notes (Signed)
Patient transported to MRI 

## 2017-11-02 NOTE — Consult Note (Signed)
Reason for Consult:LBP and leg pain Referring Physician: EDP  Abigail Cruz is an 52 y.o. female.   HPI:  Pleasant 52 year old comes in today with lower back pain and left leg pain that started last Saturday. She states that on Saturday she developed lower back pain with no previous history of any back issues. This morning she was standing up from getting off the toilet when she felt a sharp/burning pain radiate into her left hip and the left anterior quadricept stopping at her knee. She denies any NT or W in her legs. Denies any changes in her bowel or bladder habits. She states that the pain is a 8/10 and constant. Denies any aggravating factors. States that it actually feels a little better when she ambulates.     Past Medical History:  Diagnosis Date  . Chronic kidney disease    kidney stones  . Pneumonia     Past Surgical History:  Procedure Laterality Date  . CHOLECYSTECTOMY    . TUBAL LIGATION      Allergies  Allergen Reactions  . Bee Venom Anaphylaxis  . Iodine Dermatitis and Rash  . Other Anaphylaxis    ANT bites  . Latex Other (See Comments)  . Shellfish-Derived Products Palpitations and Rash    Social History   Tobacco Use  . Smoking status: Never Smoker  . Smokeless tobacco: Never Used  Substance Use Topics  . Alcohol use: No    Family History  Problem Relation Age of Onset  . Hypertension Mother   . Diabetes Mother   . Cancer Mother        cervical  . Hypertension Father   . Cancer Maternal Aunt        breast  . Cancer Paternal Aunt        breast  . Heart attack Paternal Grandfather   . Stroke Paternal Grandfather      Review of Systems  Positive ROS: as above  All other systems have been reviewed and were otherwise negative with the exception of those mentioned in the HPI and as above.  Objective: Vital signs in last 24 hours: Temp:  [97.6 F (36.4 C)] 97.6 F (36.4 C) (10/17 0614) Pulse Rate:  [77-84] 84 (10/17 1110) Resp:  [15-17] 15  (10/17 1110) BP: (129-139)/(64-88) 131/64 (10/17 1110) SpO2:  [97 %-98 %] 98 % (10/17 1110)  General Appearance: Alert, cooperative, no distress, appears stated age Head: Normocephalic, without obvious abnormality, atraumatic Eyes: PERRL, conjunctiva/corneas clear, EOM's intact, fundi benign, both eyes      Lungs: respirations unlabored Heart: Regular rate and rhythm Pulses: 2+ and symmetric all extremities Skin: Skin color, texture, turgor normal, no rashes or lesions  NEUROLOGIC:   Mental status: A&O x4, no aphasia, good attention span, Memory and fund of knowledge Motor Exam - grossly normal, normal tone and bulk Sensory Exam - grossly normal Coordination - grossly normal Gait - not tested Balance - grossly normal Cranial Nerves: I: smell Not tested  II: visual acuity  OS: na    OD: na  II: visual fields Full to confrontation  II: pupils Equal, round, reactive to light  III,VII: ptosis None  III,IV,VI: extraocular muscles    V: mastication   V: facial light touch sensation    V,VII: corneal reflex    VII: facial muscle function - upper    VII: facial muscle function - lower   VIII: hearing   IX: soft palate elevation    IX,X: gag reflex  XI: trapezius strength    XI: sternocleidomastoid strength   XI: neck flexion strength    XII: tongue strength      Data Review Lab Results  Component Value Date   WBC 12.0 (H) 11/02/2017   HGB 13.2 11/02/2017   HCT 41.1 11/02/2017   MCV 87.8 11/02/2017   PLT 315 11/02/2017   Lab Results  Component Value Date   NA 135 11/02/2017   K 3.9 11/02/2017   CL 102 11/02/2017   CO2 24 11/02/2017   BUN 15 11/02/2017   CREATININE 0.69 11/02/2017   GLUCOSE 101 (H) 11/02/2017   No results found for: INR, PROTIME  Radiology: Mr Lumbar Spine Wo Contrast  Addendum Date: 11/02/2017   ADDENDUM REPORT: 11/02/2017 11:39 ADDENDUM: After additional review and clinical correlation with Dr. Sherley Bounds, note is made of an extraforaminal  protrusion at L2-3 on the LEFT. See series 9, image 16. LEFT L2 nerve root irritation/displacement is likely. Electronically Signed   By: Staci Righter M.D.   On: 11/02/2017 11:39   Result Date: 11/02/2017 CLINICAL DATA:  Low back pain. LEFT leg pain. Onset several days ago. No urinary discomfort. EXAM: MRI LUMBAR SPINE WITHOUT CONTRAST TECHNIQUE: Multiplanar, multisequence MR imaging of the lumbar spine was performed. No intravenous contrast was administered. COMPARISON:  None. FINDINGS: Segmentation:  Standard. Alignment:  Slight anterolisthesis L4-5, 1-2 mm. Vertebrae:  No worrisome osseous lesion. Conus medullaris and cauda equina: Conus extends to the L1 level. Conus and cauda equina appear normal. Paraspinal and other soft tissues: Increased body habitus without features of epidural lipomatosis. Disc levels: L1-L2:  Normal. L2-L3:  Disc desiccation.  Facet arthropathy.  No protrusion. L3-L4: Disc space narrowing. Facet arthropathy and ligamentum flavum hypertrophy. Far-lateral and foraminal annular rent with protrusion on the LEFT. LEFT L4, possibly LEFT L3 neural impingement/irritation. L4-L5: 1-2 mm anterolisthesis. Disc space narrowing. Posterior element hypertrophy. Annular bulge. No protrusion. No impingement. L5-S1: Disc space narrowing. Facet arthropathy. Central protrusion/extrusion, caudally migrated fragment. This does not result in stenosis or contact either S1 nerve root, but is mildly eccentric to the LEFT. IMPRESSION: The dominant LEFT-sided abnormality appears to be at L3-4, where a far-lateral and foraminal annular rent with protrusion affects the LEFT L4, possibly LEFT L3 nerve roots. 1-2 mm facet mediated anterolisthesis L4-5, without impingement. Central protrusion/extrusion L5-S1 with caudally migrated fragment which does not result in stenosis or S1 nerve root impingement. Significance uncertain. Electronically Signed: By: Staci Righter M.D. On: 11/02/2017 09:18   Dg Hip Unilat W Or  Wo Pelvis 2-3 Views Left  Result Date: 11/02/2017 CLINICAL DATA:  Left hip pain for 4 days.  No known injury. EXAM: DG HIP (WITH OR WITHOUT PELVIS) 2-3V LEFT COMPARISON:  None. FINDINGS: There is no evidence of hip fracture or dislocation. There is no evidence of arthropathy or other focal bone abnormality. IMPRESSION: Negative. Electronically Signed   By: Lucienne Capers M.D.   On: 11/02/2017 06:46    Assessment/Plan: 52 year patient presented to the ED today after experiencing excruciating pain in her lower back and left leg. MRI reveals a left foraminal disc herniation at L2-3 likely causing an L2 radiculopathy. She has received narcotics and steroids while in the ED. I do believe that her pain will subside given time.We discussed her plan together as far as injections, hospital admission, and pain management. She feels she will do better at home with pain medication and a medrol dose pack. We will plan to see her in our office  in 2 weeks. This has already been set up with my Network engineer.    Ocie Cornfield Dymond Spreen 11/02/2017 12:15 PM

## 2017-11-02 NOTE — ED Triage Notes (Signed)
Patient reports worsening left lower back pain radiating to left hip/left thigh onset Saturday , denies injury , no urinary discomfort , pain increases with movement /sitting .

## 2017-11-02 NOTE — ED Notes (Signed)
Patient transported to X-ray 

## 2017-11-03 ENCOUNTER — Ambulatory Visit: Payer: BLUE CROSS/BLUE SHIELD | Admitting: Family Medicine

## 2017-11-03 LAB — URINE CULTURE

## 2017-11-08 ENCOUNTER — Ambulatory Visit: Payer: BLUE CROSS/BLUE SHIELD | Admitting: Family Medicine

## 2017-11-09 ENCOUNTER — Encounter: Payer: Self-pay | Admitting: Family Medicine

## 2017-11-09 ENCOUNTER — Ambulatory Visit: Payer: BLUE CROSS/BLUE SHIELD | Admitting: Family Medicine

## 2017-11-09 VITALS — BP 134/84 | HR 100 | Ht 62.0 in | Wt 248.0 lb

## 2017-11-09 DIAGNOSIS — M549 Dorsalgia, unspecified: Secondary | ICD-10-CM | POA: Diagnosis not present

## 2017-11-09 DIAGNOSIS — M5127 Other intervertebral disc displacement, lumbosacral region: Secondary | ICD-10-CM

## 2017-11-09 DIAGNOSIS — M5416 Radiculopathy, lumbar region: Secondary | ICD-10-CM | POA: Diagnosis not present

## 2017-11-09 MED ORDER — NAPROXEN 500 MG PO TABS: 500.0000 mg | ORAL_TABLET | Freq: Two times a day (BID) | ORAL | 0 refills | Status: DC

## 2017-11-09 MED ORDER — PHENTERMINE HCL 37.5 MG PO TABS: 37.5000 mg | ORAL_TABLET | Freq: Every day | ORAL | 2 refills | Status: DC

## 2017-11-09 NOTE — Patient Instructions (Signed)
Fantastic job on the weight loss.  8 more pounds until you hit 100 pound weight loss.  This is phenomenal.  Keep up the great work.  Let me know if there is anything I can do to help you in the future with your back in all.  [place] with Dr. Mechele Dawley and Dr Maryruth Eve is excellent-  they are in [place].

## 2017-11-09 NOTE — Progress Notes (Signed)
Wt loss OV note & ER f/up note   Impression and Recommendations:    1. Herniated nucleus pulposus of lumbosacral region   2. Obesity, Class III, BMI 40-49.9 (morbid obesity) ([place])   3. Lumbar radiculopathy   4. Acute back pain less than 4 weeks duration     1. Ruptured Discs in Lower Back - Seen in ER 11/02/2017 MR Lumbar Spine Wo Contrast 11/02/2017 CLINICAL DATA:  Low back pain. LEFT leg pain. Onset several days ago. No urinary discomfort.  EXAM: MRI LUMBAR SPINE WITHOUT CONTRAST  TECHNIQUE: Multiplanar, multisequence MR imaging of the lumbar spine was performed. No intravenous contrast was administered.  COMPARISON:  None.  FINDINGS: Segmentation:  Standard.  Alignment:  Slight anterolisthesis L4-5, 1-2 mm.  Vertebrae:  No worrisome osseous lesion.  Conus medullaris and cauda equina: Conus extends to the L1 level. Conus and cauda equina appear normal.  Paraspinal and other soft tissues: Increased body habitus without features of epidural lipomatosis.  Disc levels:  L1-L2:  Normal.  L2-L3:  Disc desiccation.  Facet arthropathy.  No protrusion.  L3-L4: Disc space narrowing. Facet arthropathy and ligamentum flavum hypertrophy. Far-lateral and foraminal annular rent with protrusion on the LEFT. LEFT L4, possibly LEFT L3 neural impingement/irritation.  L4-L5: 1-2 mm anterolisthesis. Disc space narrowing. Posterior element hypertrophy. Annular bulge. No protrusion. No impingement.  L5-S1: Disc space narrowing. Facet arthropathy. Central protrusion/extrusion, caudally migrated fragment. This does not result in stenosis or contact either S1 nerve root, but is mildly eccentric to the LEFT.  IMPRESSION: The dominant LEFT-sided abnormality appears to be at L3-4, where a far-lateral and foraminal annular rent with protrusion affects the LEFT L4, possibly LEFT L3 nerve roots.  1-2 mm facet mediated anterolisthesis L4-5, without  impingement.  Central protrusion/extrusion L5-S1 with caudally migrated fragment which does not result in stenosis or S1 nerve root impingement. Significance uncertain.  Electronically Signed: By: Staci Righter M.D. On: 11/02/2017 09:18  ADDENDUM REPORT: 11/02/2017 11:39  ADDENDUM: After additional review and clinical correlation with Dr. Sherley Bounds, note is made of an extraforaminal protrusion at L2-3 on the LEFT. See series 9, image 16. LEFT L2 nerve root irritation/displacement is likely.  Electronically Signed   By: Staci Righter M.D.   On: 11/02/2017 11:39  - Reviewed that if she loses bowel or bladder control, this is an emergency.  - Patient knows to seek emergency care if she experiences red flag symptoms.  - Prescription naprosyn provided to patient today after R/B med d/c pt   - Advised patient DO NOT take OTC ibuprofen, aleve, or Advil on top of this prescription.  - Advised patient to continue treatment and follow-up as prescribed and recommended.  - Reviewed that specialist will manage her chronic pain associated with her ruptured discs.   2. Weight Mgt: - Patient continues losing weight.  Prescriptions refilled PRN.  - Explained to patient what BMI refers to, and what it means medically.    Told patient to think about it as a "medical risk stratification measurement" and how increasing BMI is associated with increasing risk/ or worsening state of various diseases such as hypertension, hyperlipidemia, diabetes, premature OA, depression etc.  - American Heart Association guidelines for healthy diet, basically Mediterranean diet, and exercise guidelines of 30 minutes 5 days per week or more discussed in detail.  - Reminded patient the need for yearly complete physical exam office visits in addition to office visits for management of the chronic diseases  Education and routine counseling performed. Handouts provided.  3. Lifestyle & Preventative Health  Maintenance - As her back heals, advised patient to continue working toward exercising to improve overall mental, physical, and emotional health.    - Encouraged patient to engage in daily physical activity, especially a formal exercise routine.  Recommended that the patient eventually strive for at least 150 minutes of moderate cardiovascular activity per week according to guidelines established by the [facility].   - Healthy dietary habits encouraged, including low-carb, and high amounts of lean protein in diet.   - Patient should also consume adequate amounts of water.  4. Follow-Up - Return in 8 weeks for 8 lbs weight loss (to hit 100 lbs total loss).  - Prescriptions refilled today PRN. - Re-check fasting lab work as recommended. - Otherwise, continue to return for CPE and chronic follow-up as scheduled.   - Patient knows to call in sooner if desired to address acute concerns.    Meds ordered this encounter  Medications  . phentermine (ADIPEX-P) 37.5 MG tablet    Sig: Take 1 tablet (37.5 mg total) by mouth daily before breakfast.    Dispense:  30 tablet    Refill:  2  . naproxen (NAPROSYN) 500 MG tablet    Sig: Take 1 tablet (500 mg total) by mouth 2 (two) times daily with a meal. Prn severe pain    Dispense:  60 tablet    Refill:  0    The patient was counseled, risk factors were discussed, anticipatory guidance given.  -Gross side effects, risk and benefits, and alternatives of medications discussed with patient.  Patient is aware that all medications have potential side effects and we are unable to predict every side effect or drug-drug interaction that may occur.  Expresses verbal understanding and consents to current therapy plan and treatment regimen.   Return for 30mo- Goal of 8lbs wt loss to reach 100 lb total!!!.   Please see AVS handed out to patient at the end of our visit for further patient instructions/ counseling done pertaining to today's office visit.    Note:   This document was prepared using Dragon voice recognition software and may include unintentional dictation errors.    This document serves as a record of services personally performed by Mellody Dance, DO. It was created on her behalf by Toni Amend, a trained medical scribe. The creation of this record is based on the scribe's personal observations and the provider's statements to them.   I have reviewed the above medical documentation for accuracy and completeness and I concur.  Mellody Dance, D.O.    ______________________________________________________________________    Subjective:  HPI: [place] y.o. female  presents for 3 month follow up for ER follow-Up and Weight Loss.  States she's been doing well with her weight loss.  "I'm still doing what I'm doing."   She has continued working in spite of her recent medical complications.  Weight Loss Patient is down to 248 lbs.  Per last visit, her highest weight was at 335 lbs.   Patient is at 92 lbs total weight loss as of this appointment.  Ruptured Discs in Lower Back - Seen in ER 11/02/2017 Notes she has two ruptured discs in her lower back.  "I can't even sit right now." She is returning next week to neurosurgery.  "He wanted the antiinflammatory and steroids to do their thing first."  Thought she pulled a muscle at first; then the pain worsened.  States it  felt like fire travelling down her back and around her thigh.  She was crying on the phone, couldn't even tell her husband what was wrong with her, it hurt so bad.  Confirms that the steroids have decreased the pain.  Says the steroids were making her really hot and hungry as well.  Taking antibiotics and muscle relaxers.  Weaning down on pain pills.  Was taking Aleve; is now taking motrin.  Patient notes she had a tooth issue prior to her ruptured discs, along with a UTI.   Weight:  Wt Readings from Last 3 Encounters:  11/09/17 248 lb (112.5 kg)    08/29/17 255 lb 9.6 oz (115.9 kg)  07/13/17 262 lb (118.8 kg)   BMI Readings from Last 3 Encounters:  11/09/17 45.36 kg/m  08/29/17 46.75 kg/m  07/13/17 47.92 kg/m   Lab Results  Component Value Date   HGBA1C 5.1 11/17/2017   HGBA1C 5.1 11/08/2016   HGBA1C 5.1 08/11/2015    Review of Systems: General:   No F/C, wt loss Pulm:   No DIB, SOB, pleuritic chest pain Card:  No CP, palpitations Abd:  No n/v/d or pain Ext:  No inc edema from baseline   Objective: Physical Exam: BP 134/84   Pulse 100   Ht 5\' 2"  (1.575 m)   Wt 248 lb (112.5 kg)   LMP 10/18/2017 Comment: tubes tied  SpO2 99%   BMI 45.36 kg/m  Body mass index is 45.36 kg/m. General: Well nourished, in no apparent distress. Eyes: PERRLA, EOMs, conjunctiva clr no swelling or erythema ENT/Mouth: Hearing appears normal.  Mucus Membranes Moist  Neck: Supple, no masses Resp: Respiratory effort- normal, ECTA B/L w/o W/R/R  Cardio: RRR w/o MRGs. Abdomen: no gross distention. Lymphatics:  Brisk peripheral pulses, less 2 sec cap RF, no gross edema  M-sk: Full ROM, 5/5 strength, normal gait.  Skin: Warm, dry without rashes, lesions, ecchymosis.  Neuro: Alert, Oriented Psych: Normal affect, Insight and Judgment appropriate.    Current Medications:  Current Outpatient Medications on File Prior to Visit  Medication Sig Dispense Refill  . acetaminophen (TYLENOL) 325 MG tablet Take 975 mg by mouth once.    Marland Kitchen albuterol (PROVENTIL) (2.5 MG/3ML) 0.083% nebulizer solution Take 3 mLs (2.5 mg total) by nebulization every 6 (six) hours as needed for wheezing or shortness of breath. 150 mL 1  . cephALEXin (KEFLEX) 500 MG capsule Take 2 capsules (1,000 mg total) by mouth 2 (two) times daily. 28 capsule 0  . methylPREDNISolone (MEDROL DOSEPAK) 4 MG TBPK tablet Take as per Dosepak instructions 21 tablet 0  . montelukast (SINGULAIR) 10 MG tablet Take 1 tablet (10 mg total) by mouth at bedtime. 90 tablet 3  . orphenadrine  (NORFLEX) 100 MG tablet Take 1 tablet (100 mg total) by mouth 2 (two) times daily. 30 tablet 0  . oxyCODONE-acetaminophen (PERCOCET) 5-325 MG tablet Take 1-2 tablets by mouth every 6 (six) hours as needed. 20 tablet 0  . tiZANidine (ZANAFLEX) 2 MG tablet Take 2 mg by mouth every 6 (six) hours as needed for muscle spasms.    . Vitamin D, Ergocalciferol, (DRISDOL) 50000 units CAPS capsule Take 1 capsule (50,000 Units total) by mouth 2 (two) times a week. 24 capsule 4   No current facility-administered medications on file prior to visit.     Medical History:  Patient Active Problem List   Diagnosis Date Noted  . Obesity, Class III, BMI 40-49.9 (morbid obesity) ([place]) 04/27/2017  . Plantar fascia syndrome-  R  04/27/2017  . Skin lesion of left lower extremity 03/22/2017  . Bad dreams 11/10/2015  . Vivid dream 11/10/2015  . Snoring 11/10/2015  . 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 Borderline diastolic hypertension 48/54/6270  . Hypertriglyceridemia without hypercholesterolemia 08/12/2015  . Low serum HDL 08/12/2015  . 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 Environmental and seasonal allergies 08/11/2015  . h/o Nephrolithiasis  08/11/2015  . H/O tubal ligation 08/11/2015  . Family history of diabetes mellitus 08/11/2015  . Family history of stroke 08/11/2015  . Family history of hypertension 08/11/2015  . Vitamin D deficiency 08/11/2015    Allergies:  Allergies  Allergen Reactions  . Bee Venom Anaphylaxis  . Iodine Dermatitis and Rash  . Other Anaphylaxis    ANT bites  . Latex Other (See Comments)  . Shellfish-Derived Products Palpitations and Rash     Family history-  Reviewed; changed as appropriate  Social history-  Reviewed; changed as appropriate

## 2017-11-17 ENCOUNTER — Other Ambulatory Visit: Payer: BLUE CROSS/BLUE SHIELD

## 2017-11-17 DIAGNOSIS — Z0000 Encounter for general adult medical examination without abnormal findings: Secondary | ICD-10-CM

## 2017-11-17 DIAGNOSIS — E559 Vitamin D deficiency, unspecified: Secondary | ICD-10-CM

## 2017-11-17 DIAGNOSIS — E781 Pure hyperglyceridemia: Secondary | ICD-10-CM

## 2017-11-18 LAB — COMPREHENSIVE METABOLIC PANEL
A/G RATIO: 1.7 (ref 1.2–2.2)
ALT: 33 IU/L — ABNORMAL HIGH (ref 0–32)
AST: 24 IU/L (ref 0–40)
Albumin: 3.8 g/dL (ref 3.5–5.5)
Alkaline Phosphatase: 89 IU/L (ref 39–117)
BUN / CREAT RATIO: 25 — AB (ref 9–23)
BUN: 15 mg/dL (ref 6–24)
Bilirubin Total: 0.5 mg/dL (ref 0.0–1.2)
CO2: 25 mmol/L (ref 20–29)
Calcium: 9 mg/dL (ref 8.7–10.2)
Chloride: 103 mmol/L (ref 96–106)
Creatinine, Ser: 0.6 mg/dL (ref 0.57–1.00)
GFR calc Af Amer: 123 mL/min/{1.73_m2} (ref >59)
GFR, EST NON AFRICAN AMERICAN: 107 mL/min/{1.73_m2} (ref >59)
GLUCOSE: 89 mg/dL (ref 65–99)
Globulin, Total: 2.3 g/dL (ref 1.5–4.5)
POTASSIUM: 4.2 mmol/L (ref 3.5–5.2)
Sodium: 138 mmol/L (ref 134–144)
TOTAL PROTEIN: 6.1 g/dL (ref 6.0–8.5)

## 2017-11-18 LAB — CBC WITH DIFFERENTIAL/PLATELET
BASOS ABS: 0.1 10*3/uL (ref 0.0–0.2)
BASOS: 1 %
EOS (ABSOLUTE): 0.5 10*3/uL — AB (ref 0.0–0.4)
Eos: 5 %
Hematocrit: 38.5 % (ref 34.0–46.6)
Hemoglobin: 13 g/dL (ref 11.1–15.9)
Immature Grans (Abs): 0 10*3/uL (ref 0.0–0.1)
Immature Granulocytes: 0 %
LYMPHS: 20 %
Lymphocytes Absolute: 2 10*3/uL (ref 0.7–3.1)
MCH: 28.6 pg (ref 26.6–33.0)
MCHC: 33.8 g/dL (ref 31.5–35.7)
MCV: 85 fL (ref 79–97)
MONOS ABS: 0.9 10*3/uL (ref 0.1–0.9)
Monocytes: 9 %
NEUTROS ABS: 6.3 10*3/uL (ref 1.4–7.0)
Neutrophils: 65 %
PLATELETS: 303 10*3/uL (ref 150–450)
RBC: 4.55 x10E6/uL (ref 3.77–5.28)
RDW: 13.1 % (ref 12.3–15.4)
WBC: 9.9 10*3/uL (ref 3.4–10.8)

## 2017-11-18 LAB — LIPID PANEL
CHOL/HDL RATIO: 3.4 ratio (ref 0.0–4.4)
Cholesterol, Total: 158 mg/dL (ref 100–199)
HDL: 46 mg/dL (ref >39)
LDL Calculated: 90 mg/dL (ref 0–99)
Triglycerides: 111 mg/dL (ref 0–149)
VLDL CHOLESTEROL CAL: 22 mg/dL (ref 5–40)

## 2017-11-18 LAB — T4, FREE: FREE T4: 1.37 ng/dL (ref 0.82–1.77)

## 2017-11-18 LAB — HEMOGLOBIN A1C
Est. average glucose Bld gHb Est-mCnc: 100 mg/dL
HEMOGLOBIN A1C: 5.1 % (ref 4.8–5.6)

## 2017-11-18 LAB — TSH: TSH: 2.09 u[IU]/mL (ref 0.450–4.500)

## 2017-11-18 LAB — VITAMIN D 25 HYDROXY (VIT D DEFICIENCY, FRACTURES): Vit D, 25-Hydroxy: 65.7 ng/mL (ref 30.0–100.0)

## 2017-12-25 ENCOUNTER — Ambulatory Visit (INDEPENDENT_AMBULATORY_CARE_PROVIDER_SITE_OTHER): Payer: BLUE CROSS/BLUE SHIELD | Admitting: Family Medicine

## 2017-12-25 ENCOUNTER — Encounter: Payer: Self-pay | Admitting: Family Medicine

## 2017-12-25 VITALS — BP 140/82 | HR 73 | Temp 97.5°F | Ht 62.0 in | Wt 253.0 lb

## 2017-12-25 DIAGNOSIS — R05 Cough: Secondary | ICD-10-CM | POA: Diagnosis not present

## 2017-12-25 DIAGNOSIS — H6691 Otitis media, unspecified, right ear: Secondary | ICD-10-CM | POA: Diagnosis not present

## 2017-12-25 DIAGNOSIS — J019 Acute sinusitis, unspecified: Secondary | ICD-10-CM

## 2017-12-25 DIAGNOSIS — B9689 Other specified bacterial agents as the cause of diseases classified elsewhere: Secondary | ICD-10-CM

## 2017-12-25 DIAGNOSIS — R059 Cough, unspecified: Secondary | ICD-10-CM

## 2017-12-25 MED ORDER — AMOXICILLIN-POT CLAVULANATE 875-125 MG PO TABS: 1.0000 | ORAL_TABLET | Freq: Two times a day (BID) | ORAL | 0 refills | Status: AC

## 2017-12-25 MED ORDER — HYDROCOD POLST-CPM POLST ER 10-8 MG/5ML PO SUER: 5.0000 mL | Freq: Two times a day (BID) | ORAL | 0 refills | Status: DC | PRN

## 2017-12-25 MED ORDER — PREDNISONE 20 MG PO TABS: ORAL_TABLET | ORAL | 0 refills | Status: DC

## 2017-12-25 NOTE — Progress Notes (Signed)
Acute Care Office visit  Assessment and plan:  1. Right otitis media, unspecified otitis media type   2. Acute bacterial rhinosinusitis   3. Cough   4. Obesity, Class III, BMI 40-49.9 (morbid obesity) ([facility])     1. Upper Respiratory Infection - R Otitis Media - Viral vs Allergic vs Bacterial causes for pt's symptoms reveiwed.     - Supportive care and various OTC medications discussed in addition to any prescribed.  - Advised patient to discontinue use of Afrin.  Reviewed that she can take it for 3 days, but no more than 3 days.  - Encouraged patient to continue sinus rinses, using AYR or Neilmed sinus rinses BID followed by flonase BID (one spray to each nostril).  Advised that the patient may also incorporate allegra or claritin PRN.   - Augmentin prescribed today.  - Course of prednisone prescribed today.  - Tussionex prescribed today.  - Call or RTC if new symptoms, or if no improvement or worse over next several days.    2. Weight Loss - Continue with weight loss goals as recommended.  - Goal set for 2 months: 8 lbs by end of January.  - BMI Counseling Explained to patient what BMI refers to, and what it means medically.    Told patient to think about it as a "medical risk stratification measurement" and how increasing BMI is associated with increasing risk/ or worsening state of various diseases such as hypertension, hyperlipidemia, diabetes, premature OA, depression etc.  American Heart Association guidelines for healthy diet, basically Mediterranean diet, and exercise guidelines of 30 minutes 5 days per week or more discussed in detail.  Health counseling performed.  All questions answered.  - Lifestyle & Preventative Health Maintenance - Advised patient to continue working toward exercising to improve overall mental, physical, and emotional health.    - Reviewed the "spokes of the wheel" of mood and health management.  Stressed the importance of ongoing  prudent habits, including regular exercise, appropriate sleep hygiene, healthful dietary habits, and prayer/meditation to relax.  - Encouraged patient to engage in daily physical activity, especially a formal exercise routine.  Recommended that the patient eventually strive for at least 150 minutes of moderate cardiovascular activity per week according to guidelines established by the [facility].   - Healthy dietary habits encouraged, including low-carb, and high amounts of lean protein in diet.   - Patient should also consume adequate amounts of water.     Meds ordered this encounter  Medications  . amoxicillin-clavulanate (AUGMENTIN) 875-125 MG tablet    Sig: Take 1 tablet by mouth 2 (two) times daily for 10 days.    Dispense:  20 tablet    Refill:  0  . predniSONE (DELTASONE) 20 MG tablet    Sig: Take 3 pills a day for 2 days, 2 pills a day for 2 days, 1 pill a day for 2 days then one half pill a day for 2 days then off    Dispense:  14 tablet    Refill:  0  . chlorpheniramine-HYDROcodone (TUSSIONEX) 10-8 MG/5ML SUER    Sig: Take 5 mLs by mouth every 12 (twelve) hours as needed for cough (cough, will cause drowsiness.).    Dispense:  200 mL    Refill:  0    Medications Discontinued During This Encounter  Medication Reason  . cephALEXin (KEFLEX) 500 MG capsule Completed Course  . methylPREDNISolone (MEDROL DOSEPAK) 4 MG TBPK tablet Completed Course  . oxyCODONE-acetaminophen (  PERCOCET) 5-325 MG tablet No longer needed (for PRN medications)     Gross side effects, risk and benefits, and alternatives of medications discussed with patient.  Patient is aware that all medications have potential side effects and we are unable to predict every sideeffect or drug-drug interaction that may occur.  Expresses verbal understanding and consents to current therapy plan and treatment regiment.   Education and routine counseling performed. Handouts provided.  Anticipatory guidance and routine  counseling done re: condition, txmnt options and need for follow up. All questions of patient's were answered.  Return for f/up end of jan- wt loss; goal 8lb.  Please see AVS handed out to patient at the end of our visit for additional patient instructions/ counseling done pertaining to today's office visit.  Note:  This document was partially repared using Dragon voice recognition software and may include unintentional dictation errors.  This document serves as a record of services personally performed by Mellody Dance, DO. It was created on her behalf by Toni Amend, a trained medical scribe. The creation of this record is based on the scribe's personal observations and the provider's statements to them.   I have reviewed the above medical documentation for accuracy and completeness and I concur.  Mellody Dance, DO   Subjective:    Chief Complaint  Patient presents with  . Follow-up  . Sinus Problem   Weight Loss Progress Pt notes she's sick today and her weight is up due to "that time of the month" and holidays.  Avoided potatoes and such.  "I just think it's period and wearing normal clothes today."  At home, she weighs herself in her nightgown, and she's not 5 lbs over.  Not doing much different, other than holidays.  HPI:  Pt presents with Sx for 5 days   C/o: Has had fever/chills at night, not during the day.  Says her eyes were getting "so hot at work."  Has been using her hands to relieve the pressure.  Feeling pressure in the frontal area of her sinuses.  Has been expressing green mucus.    Feeling wheezy; especially on the right side.  Her ear is clogged up on the right side.    For symptoms patient has tried:  Taking cold medicines.  "Everything I can throw at this thing, I've been trying to do."  She's taking Theraflu liquid.  Overall getting:  Feeling like she has an infection and upper respiratory sx.   Patient Care Team    Relationship  Specialty Notifications Start End  Mellody Dance, DO PCP - General Family Medicine  08/10/15   Eustace Moore, MD Consulting Physician Neurosurgery  11/06/17    Comment: back pain    Past medical history, Surgical history, Family history reviewed and noted below, Social history, Allergies, and Medications have been entered into the medical record, reviewed and changed as needed.   Allergies  Allergen Reactions  . Bee Venom Anaphylaxis  . Iodine Dermatitis and Rash  . Other Anaphylaxis    ANT bites  . Latex Other (See Comments)  . Shellfish-Derived Products Palpitations and Rash    Review of Systems: - see above HPI for pertinent positives General:   No F/C, wt loss Pulm:   No DIB, pleuritic chest pain Card:  No CP, palpitations Abd:  No n/v/d or pain Ext:  No inc edema from baseline   Objective:   Blood pressure 140/82, pulse 73, temperature (!) 97.5 F (36.4 C), height 5\' 2"  (1.575  m), weight 253 lb (114.8 kg), SpO2 98 %. Body mass index is 46.27 kg/m. General: Well Developed, well nourished, appropriate for stated age.  Neuro: Alert and oriented x3, extra-ocular muscles intact, sensation grossly intact.  HEENT: Normocephalic, atraumatic, pupils equal round reactive to light, neck supple, no masses, no painful lymphadenopathy, TM's intact B/L, right TM is lobulated and bulging. Nares- patent, clear d/c, OP- clear, mild erythema, No TTP sinuses Skin: Warm and dry, no gross rash. Cardiac: RRR, S1 S2,  no murmurs rubs or gallops.  Respiratory: ECTA B/L and A/P, Not using accessory muscles, speaking in full sentences- unlabored. Vascular:  No gross lower ext edema, cap RF less 2 sec. Psych: No HI/SI, judgement and insight good, Euthymic mood. Full Affect.

## 2017-12-25 NOTE — Patient Instructions (Signed)
Goal is 8lb wt loss by end of Jan :)   Wash your hands frequently, as you did not want to get those around you sick as well. Never sneeze or cough on others.  And you should not be going to school or work if you are running a temperature of 100.5 or more on two separate occasions.   Drink plenty of fluids and stay hydrated, especially if you are running fevers.  We don't know why, but chicken soup also helps, try it! :)

## 2018-02-16 ENCOUNTER — Encounter: Payer: Self-pay | Admitting: Family Medicine

## 2018-02-16 ENCOUNTER — Ambulatory Visit (INDEPENDENT_AMBULATORY_CARE_PROVIDER_SITE_OTHER): Payer: BLUE CROSS/BLUE SHIELD | Admitting: Family Medicine

## 2018-02-16 VITALS — BP 131/84 | HR 76 | Temp 98.4°F | Ht 62.0 in | Wt 252.0 lb

## 2018-02-16 DIAGNOSIS — M545 Low back pain, unspecified: Secondary | ICD-10-CM | POA: Insufficient documentation

## 2018-02-16 DIAGNOSIS — G8929 Other chronic pain: Secondary | ICD-10-CM | POA: Insufficient documentation

## 2018-02-16 DIAGNOSIS — E559 Vitamin D deficiency, unspecified: Secondary | ICD-10-CM

## 2018-02-16 DIAGNOSIS — Z803 Family history of malignant neoplasm of breast: Secondary | ICD-10-CM

## 2018-02-16 DIAGNOSIS — M722 Plantar fascial fibromatosis: Secondary | ICD-10-CM

## 2018-02-16 DIAGNOSIS — N959 Unspecified menopausal and perimenopausal disorder: Secondary | ICD-10-CM | POA: Diagnosis not present

## 2018-02-16 DIAGNOSIS — Z833 Family history of diabetes mellitus: Secondary | ICD-10-CM

## 2018-02-16 MED ORDER — PHENTERMINE HCL 37.5 MG PO TABS: 37.5000 mg | ORAL_TABLET | Freq: Every day | ORAL | 2 refills | Status: DC

## 2018-02-16 NOTE — Patient Instructions (Addendum)
Goals:  -  is to track everything you put in your mouth, into your app.  -  Also try to drink one half your weight in ounces of water per day or at least double what you are currently doing.

## 2018-02-16 NOTE — Progress Notes (Signed)
Wt loss OV note  Impression and Recommendations:    1. Menopausal disorder   2. Obesity, Class III, BMI 40-49.9 (morbid obesity) ([place])   3. Chronic low back pain, unspecified back pain laterality, unspecified whether sciatica present   4. Vitamin D deficiency   5. Plantar fascia syndrome- R    6. Family history of diabetes mellitus- mom   7. Family history of breast cancer in female- mat aunt     1. Menopausal disorder Pt requests referral to GYN for consultation re: hormone therapy and discuss her menop. ss - Ambulatory referral to Obstetrics / Gynecology   2. Obesity, Class III, BMI 40-49.9 (morbid obesity) ([place]) - still short of 100 lb wt loss and has lost 1 lb since last OV ( although did not hit her goal of 8)   Pt's Goals =  1. Tracking ALL your nutritional intake. 2. Try to hit your water intake goals.  - RF:  phentermine (ADIPEX-P) 37.5 MG tablet; Take 1 tablet (37.5 mg total) by mouth daily before breakfast.  Dispense: 30 tablet; Refill: 2   3. Chronic low back pain, unspecified back pain laterality, unspecified whether sciatica present -  Sx stable and essentially relieved with wt loss, not a concern for pt at this time  4. Vitamin D deficiency - checked 3 mo ago- was 26 and stable.  Pt continues supp  5. Plantar fascia syndrome- R  - Sx stable and essentially relieved with wt loss, not a concern for pt at this time  6. Family history of diabetes mellitus- mom - A1c yrly - has been stable - labs UTD  7. Family history of breast cancer in female- mat aunt Importance of yrly PE d/c pt - told to make appt for CPE when due - NEEDS YRLY   1. Weight Mgt - BMI of 46.09 Explained to patient what BMI refers to, and what it means medically.  Told patient to think about it as a "medical risk stratification measurement" and how increasing BMI is associated with increasing risk/ or worsening state of various diseases such as hypertension, hyperlipidemia, diabetes,  premature OA, depression etc.  American Heart Association guidelines for healthy diet, basically Mediterranean diet, and exercise guidelines of 30 minutes 5 days per week or more discussed in detail.  -Reminded patient the need for yearly complete physical exam office visits in addition to office visits for management of the chronic diseases  2. Food Tracking & Phentermine Use - Reviewed that phentermine, especially long-term, is a potentially risky medicine. - Discussed extensively that patient is supposed to take a drug holiday.  - However, as patient continues to experience no S-E, and blood pressure still stable, refill provided today. - Encouraged patient to continue to keep track of blood pressure.  - Encouraged patient to resume tracking her nutritional intake regularly.  - Advised patient to look up mindful eating, on sources such as YouTube. - Discussed that dehydration can impede weight loss.  Advised healthy hydration.  3. Lifestyle & Preventative Health Maintenance - Advised patient to continue working toward exercising to improve overall mental, physical, and emotional health.    - Encouraged patient to engage in daily physical activity, especially a formal exercise routine.  Recommended that the patient eventually strive for at least 150 minutes of moderate cardiovascular activity per week according to guidelines established by the [facility].   - Healthy dietary habits encouraged, including low-carb, and high amounts of lean protein in diet.   -  Patient should also consume adequate amounts of water.   Education and routine counseling performed. Handouts provided.  Meds ordered this encounter  Medications  . phentermine (ADIPEX-P) 37.5 MG tablet    Sig: Take 1 tablet (37.5 mg total) by mouth daily before breakfast.    Dispense:  30 tablet    Refill:  2    Orders Placed This Encounter  Procedures  . Ambulatory referral to Obstetrics / Gynecology    The patient was  counseled, risk factors were discussed, anticipatory guidance given.  -Gross side effects, risk and benefits, and alternatives of medications discussed with patient.  Patient is aware that all medications have potential side effects and we are unable to predict every side effect or drug-drug interaction that may occur.  Expresses verbal understanding and consents to current therapy plan and treatment regimen.   Return in about 4 weeks (around 03/16/2018) for Healthy living and weight mgt- 4wks- .   Please see AVS handed out to patient at the end of our visit for further patient instructions/ counseling done pertaining to today's office visit.    Note:  This document was prepared using Dragon voice recognition software and may include unintentional dictation errors.    This document serves as a record of services personally performed by Mellody Dance, DO. It was created on her behalf by Toni Amend, a trained medical scribe. The creation of this record is based on the scribe's personal observations and the provider's statements to them.   I have reviewed the above medical documentation for accuracy and completeness and I concur.  Mellody Dance, DO 02/16/2018 4:33 PM      ______________________________________________________________________   Subjective:  HPI: Abigail Cruz y.o. female  presents for 3 month follow up for multiple medical problems.  Has lost one pound since her last visit.  Notes she's been experiencing menopause.  She continues watching what she eats.  She has been exercising very little due to being so busy.  She continues on phentermine.  Denies side-effects such as irritability or difficulty sleeping.  Patient is not tracking her nutritional intake.  Notes she has trouble drinking enough water.  Patient's blood pressure is running in the 130's over 70's at home.   Weight:  Wt Readings from Last 3 Encounters:  02/16/18 252 lb (114.3 kg)    12/25/17 253 lb (114.8 kg)  11/09/17 248 lb (112.5 kg)   BMI Readings from Last 3 Encounters:  02/16/18 46.09 kg/m  12/25/17 46.27 kg/m  11/09/17 45.36 kg/m   Lab Results  Component Value Date   HGBA1C 5.1 11/17/2017   HGBA1C 5.1 11/08/2016   HGBA1C 5.1 08/11/2015    Review of Systems: General:   No F/C, wt loss Pulm:   No DIB, SOB, pleuritic chest pain Card:  No CP, palpitations Abd:  No n/v/d or pain Ext:  No inc edema from baseline   Objective: Physical Exam: BP 131/84   Pulse 76   Temp 98.4 F (36.9 C)   Ht 5\' 2"  (1.575 m)   Wt 252 lb (114.3 kg)   LMP 01/26/2018 (Approximate)   SpO2 98%   BMI 46.09 kg/m  Body mass index is 46.09 kg/m. General: Well nourished, in no apparent distress. Eyes: PERRLA, EOMs, conjunctiva clr no swelling or erythema ENT/Mouth: Hearing appears normal.  Mucus Membranes Moist  Neck: Supple, no masses Resp: Respiratory effort- normal, ECTA B/L w/o W/R/R  Cardio: RRR w/o MRGs. Abdomen: no gross distention. Lymphatics:  Brisk peripheral pulses, less 2  sec cap RF, no gross edema  M-sk: Full ROM, 5/5 strength, normal gait.  Skin: Warm, dry without rashes, lesions, ecchymosis.  Neuro: Alert, Oriented Psych: Normal affect, Insight and Judgment appropriate.    Current Medications:  Current Outpatient Medications on File Prior to Visit  Medication Sig Dispense Refill  . acetaminophen (TYLENOL) 325 MG tablet Take 975 mg by mouth once.    Marland Kitchen albuterol (PROVENTIL) (2.5 MG/3ML) 0.083% nebulizer solution Take 3 mLs (2.5 mg total) by nebulization every 6 (six) hours as needed for wheezing or shortness of breath. 150 mL 1  . montelukast (SINGULAIR) 10 MG tablet Take 1 tablet (10 mg total) by mouth at bedtime. 90 tablet 3  . naproxen (NAPROSYN) 500 MG tablet Take 1 tablet (500 mg total) by mouth 2 (two) times daily with a meal. Prn severe pain 60 tablet 0  . orphenadrine (NORFLEX) 100 MG tablet Take 1 tablet (100 mg total) by mouth 2 (two)  times daily. 30 tablet 0  . tiZANidine (ZANAFLEX) 2 MG tablet Take 2 mg by mouth every 6 (six) hours as needed for muscle spasms.    . Vitamin D, Ergocalciferol, (DRISDOL) 50000 units CAPS capsule Take 1 capsule (50,000 Units total) by mouth 2 (two) times a week. 24 capsule 4   No current facility-administered medications on file prior to visit.     Medical History:  Patient Active Problem List   Diagnosis Date Noted  . Obesity, Class III, BMI 40-49.9 (morbid obesity) ([place]) 04/27/2017    Priority: High  . Hypertriglyceridemia without hypercholesterolemia 08/12/2015    Priority: High  . Low serum HDL 08/12/2015    Priority: High  . Chronic low back pain 02/16/2018    Priority: Medium  . Snoring (mom with OSA)  11/10/2015    Priority: Medium  . 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 Vitamin D deficiency 08/11/2015    Priority: Medium  . Family history of breast cancer in female- mat aunt 02/16/2018    Priority: Low    Class: Family History of  . Plantar fascia syndrome- R  04/27/2017    Priority: Low  . Environmental and seasonal allergies 08/11/2015    Priority: Low  . Family history of diabetes mellitus- MOM 08/11/2015    Priority: Low  . Skin lesion of left lower extremity 03/22/2017  . Bad dreams 11/10/2015  . Vivid dream 11/10/2015  . 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 Borderline diastolic hypertension 41/74/0814  . h/o Nephrolithiasis  08/11/2015  . H/O tubal ligation 08/11/2015  . Family history of stroke 08/11/2015  . Family history of hypertension 08/11/2015    Allergies:  Allergies  Allergen Reactions  . Bee Venom Anaphylaxis  . Iodine Dermatitis and Rash  . Other Anaphylaxis    ANT bites  . Latex Other (See Comments)  . Shellfish-Derived Products Palpitations and Rash     Family history-  Reviewed; changed as appropriate  Social history-  Reviewed; changed as appropriate

## 2018-02-21 ENCOUNTER — Encounter: Payer: Self-pay | Admitting: Obstetrics and Gynecology

## 2018-03-09 ENCOUNTER — Ambulatory Visit: Payer: Self-pay | Admitting: Obstetrics and Gynecology

## 2018-04-18 ENCOUNTER — Telehealth: Payer: Self-pay | Admitting: Obstetrics and Gynecology

## 2018-04-18 NOTE — Telephone Encounter (Signed)
Called and left a message for patient to call back to schedule a new patient doctor referral appointment with our office to see Dr. Talbert Nan for: Menopausal disorder.

## 2018-04-25 ENCOUNTER — Ambulatory Visit: Payer: BLUE CROSS/BLUE SHIELD | Admitting: Obstetrics and Gynecology

## 2018-04-26 ENCOUNTER — Ambulatory Visit: Payer: BLUE CROSS/BLUE SHIELD | Admitting: Obstetrics and Gynecology

## 2018-05-18 ENCOUNTER — Encounter: Payer: Self-pay | Admitting: Obstetrics and Gynecology

## 2018-05-18 ENCOUNTER — Other Ambulatory Visit: Payer: Self-pay

## 2018-05-18 ENCOUNTER — Ambulatory Visit: Payer: BLUE CROSS/BLUE SHIELD | Admitting: Obstetrics and Gynecology

## 2018-05-18 VITALS — BP 124/90 | HR 70 | Temp 98.0°F | Resp 16 | Ht 60.75 in | Wt 267.0 lb

## 2018-05-18 DIAGNOSIS — L659 Nonscarring hair loss, unspecified: Secondary | ICD-10-CM

## 2018-05-18 DIAGNOSIS — N951 Menopausal and female climacteric states: Secondary | ICD-10-CM | POA: Diagnosis not present

## 2018-05-18 DIAGNOSIS — N926 Irregular menstruation, unspecified: Secondary | ICD-10-CM

## 2018-05-18 LAB — POCT URINE PREGNANCY: Preg Test, Ur: NEGATIVE

## 2018-05-18 MED ORDER — MEDROXYPROGESTERONE ACETATE 5 MG PO TABS: ORAL_TABLET | ORAL | 1 refills | Status: DC

## 2018-05-18 NOTE — Progress Notes (Signed)
53 y.o. G2P0 Married White or Caucasian Not Hispanic or Latino female here for a consultation from Dr Raliegh Scarlet to discuss menopausal symptoms.   She reports normal monthly cycles until November. Her cycles typically lasted for 4 days, with light flow (changes a super tampon in 8 hours).  She skipped her cycle in 12/19. About 8 weeks after her November cycle she had a cycle in January. That cycle lasted for 2 weeks, she was saturating a super tampon in up to 1 hour. Normal cycle in 2/20 and 3/20, she is currently 4-5 days late for her cycle. No intermenstrual bleeding.  She has some recent hair loss, no change in dry skin.  Period Cycle (Days): (irregular) Period Duration (Days): 4 Period Pattern: (!) Irregular Menstrual Flow: (light to heavy to light) Menstrual Control: Tampon, Maxi pad Dysmenorrhea: (!) Mild   She started having hot flashes in 1/20, occasional night sweats. Only having ~1-2 hot flashes a week. Increase in mood changes in the last few months. She has noticed vaginal dryness recently. Sexually active 4-5 x a week, not needing a lubricant, no pain.   She has lost 70 lbs since last year. Since her cycles have become more irregular she is starting to gain weight.   Patient's last menstrual period was 04/16/2018 (exact date).          Sexually active: Yes.    The current method of family planning is tubal ligation.    Exercising: No.  exercise Smoker:  no  Health Maintenance: Pap:  37yrs History of abnormal Pap:  In 20's MMG:  2017 category a density birads 1;neg BMD:   none Colonoscopy: none TDaP:  unsure Gardasil: no   reports that she has never smoked. She has never used smokeless tobacco. She reports that she does not drink alcohol or use drugs. 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  Son is 72 in [place]. Daughter is 48, goes [employer].  Very happily married for 28 years.   Past Medical History:  Diagnosis Date  . Abnormal Pap smear of  cervix    in her 20's  . Asthma   . Chronic kidney disease    kidney stones  . Pneumonia     Past Surgical History:  Procedure Laterality Date  . bladder stretching    . CHOLECYSTECTOMY    . LITHOTRIPSY    . OTHER SURGICAL HISTORY     cyst removal  . TUBAL LIGATION      Current Outpatient Medications  Medication Sig Dispense Refill  . acetaminophen (TYLENOL) 325 MG tablet Take 975 mg by mouth as needed.     . fexofenadine (ALLEGRA) 180 MG tablet Take 180 mg by mouth daily.    . Vitamin D, Ergocalciferol, (DRISDOL) 50000 units CAPS capsule Take 1 capsule (50,000 Units total) by mouth 2 (two) times a week. 24 capsule 4   No current facility-administered medications for this visit.     Family History  Problem Relation Age of Onset  . Hypertension Mother   . Diabetes Mother   . Cancer Mother        cervical  . Hypertension Father   . Cancer Maternal Aunt        breast  . Cancer Paternal Aunt        breast  . Heart attack Paternal Grandfather   . Stroke Paternal Grandfather   . Cancer Maternal Uncle        breast cancer  .  Cervical cancer Cousin     Review of Systems  Constitutional: Negative.   HENT: Negative.   Eyes: Negative.   Respiratory: Negative.   Cardiovascular: Negative.   Gastrointestinal: Negative.   Endocrine: Negative.   Genitourinary: Negative.   Musculoskeletal: Positive for neck pain.  Skin: Negative.   Allergic/Immunologic: Positive for environmental allergies.  Neurological: Negative.   Hematological: Negative.   Psychiatric/Behavioral: Negative.     Exam:   BP 124/90   Pulse 70   Temp 98 F (36.7 C) (Oral)   Resp 16   Ht 5' 0.75" (1.543 m)   Wt 267 lb (121.1 kg)   LMP 04/16/2018 (Exact Date)   BMI 50.87 kg/m   Weight change: @WEIGHTCHANGE @ Height:   Height: 5' 0.75" (154.3 cm)  Ht Readings from Last 3 Encounters:  05/18/18 5' 0.75" (1.543 m)  02/16/18 5\' 2"  (1.575 m)  12/25/17 5\' 2"  (1.575 m)    General appearance: alert,  cooperative and appears stated age Head: Normocephalic, without obvious abnormality, atraumatic Neck: no adenopathy, supple, symmetrical, trachea midline and thyroid normal to inspection and palpation    A:  Perimenopausal  H/O anovulatory bleed  Hair loss  P:   CBC, ferritin, TSH  Will treat with cyclic provera  Discussed Menopause, information given  Keep track of cycles  Will f/u for an annual exam in 2 months (overdue for pap)   ~30 minutes was spent face to face with the patient, over 50% in counseling   CC: Dr Raliegh Scarlet Letter sent

## 2018-05-18 NOTE — Patient Instructions (Signed)

## 2018-05-19 LAB — CBC
Hematocrit: 40.9 % (ref 34.0–46.6)
Hemoglobin: 13.6 g/dL (ref 11.1–15.9)
MCH: 28.3 pg (ref 26.6–33.0)
MCHC: 33.3 g/dL (ref 31.5–35.7)
MCV: 85 fL (ref 79–97)
Platelets: 323 10*3/uL (ref 150–450)
RBC: 4.81 x10E6/uL (ref 3.77–5.28)
RDW: 13.7 % (ref 11.7–15.4)
WBC: 10.4 10*3/uL (ref 3.4–10.8)

## 2018-05-19 LAB — FERRITIN: Ferritin: 28 ng/mL (ref 15–150)

## 2018-05-19 LAB — TSH: TSH: 1.59 u[IU]/mL (ref 0.450–4.500)

## 2018-05-31 ENCOUNTER — Ambulatory Visit: Payer: BLUE CROSS/BLUE SHIELD | Admitting: Family Medicine

## 2018-06-13 ENCOUNTER — Other Ambulatory Visit: Payer: Self-pay | Admitting: Family Medicine

## 2018-06-13 DIAGNOSIS — E559 Vitamin D deficiency, unspecified: Secondary | ICD-10-CM

## 2018-07-12 ENCOUNTER — Ambulatory Visit: Payer: BLUE CROSS/BLUE SHIELD | Admitting: Obstetrics and Gynecology

## 2018-07-29 ENCOUNTER — Telehealth: Payer: Self-pay

## 2018-08-06 NOTE — Telephone Encounter (Signed)
Patient's husband Myrle Sheng) sent MyChart message through his chart stating that the patient has been bleeding due to prolonged menstrual cycles for 40 days and that the flow is heavy and she is having to changing products multiple times within a couple of hours.  She c/o headaches in the mornings and her BP is slightly elevated from her normal. Now the patient is c/o of her feet drawing up like she is having cramps, big toe pointing up and husband had to pull it down. Yesterday hand drawing inward.   I spoke to Dr. Raliegh Scarlet and based on her recommendations called the husband and advised him to take the patient to the ER for stat labs and eval - he asked about urgent care and I advised that ER is best as they can get results for labs quicker and start treatment as need along with getting any advanced imaging that may be needed. MPulliam, CMA/RT(R)

## 2018-09-17 ENCOUNTER — Ambulatory Visit: Payer: Self-pay | Admitting: Family Medicine

## 2018-10-01 ENCOUNTER — Ambulatory Visit: Payer: Self-pay | Admitting: Family Medicine

## 2018-10-11 ENCOUNTER — Encounter: Payer: Self-pay | Admitting: Family Medicine

## 2018-10-11 ENCOUNTER — Ambulatory Visit: Payer: BC Managed Care – PPO | Admitting: Family Medicine

## 2018-10-11 ENCOUNTER — Other Ambulatory Visit: Payer: Self-pay

## 2018-10-11 VITALS — BP 155/96 | HR 96 | Temp 98.6°F | Resp 16 | Ht 62.0 in | Wt 290.0 lb

## 2018-10-11 DIAGNOSIS — R229 Localized swelling, mass and lump, unspecified: Secondary | ICD-10-CM

## 2018-10-11 NOTE — Progress Notes (Signed)
Impression and Recommendations:    1. Mass of skin   2. Obesity, Class III, BMI 40-49.9 (morbid obesity) ([facility])      Mass of skin - Plan: Ambulatory referral to Dermatology  Obesity, Class III, BMI 40-49.9 (morbid obesity) ([facility])    Mass of Skin - Advised patient that the area in question is fibrotic, red, irritated, and significant in size, requiring removal by dermatology.  Reviewed that clinic here is not as equipped as a dermatological office for a significant skin removal.  - Extensive education provided to patient today regarding skin lesion.  All questions answered.  - Pt prefers referral to female dermatologist such as Rozann Lesches or [place]. - Ambulatory referral to dermatology placed today.  - Will continue to monitor.  Morbid Obesity, BMI 40-49.8 - Managed on Phentermine; Elevated BP Reading - Discussed that with pt BP elevated, we cannot provide phentermine today.  - Patient will let us know what her home blood pressures are running over the next two weeks. - Patient will contact us with blood pressure log.  If BP is down in normal range, we will possibly give phentermine then, and begin four-week cycle at that time.  Otherwise, will address elevated BP.  - Ongoing ambulatory blood pressure monitoring encouraged.  - Will continue to monitor.  Recommendations - Patient knows to return to clinic for chronic follow-up, acute concerns, and health maintenance as advised.    Orders Placed This Encounter  Procedures  . Ambulatory referral to Dermatology    Gross side effects, risk and benefits, and alternatives of medications and treatment plan in general discussed with patient.  Patient is aware that all medications have potential side effects and we are unable to predict every side effect or drug-drug interaction that may occur.   Patient will call with any questions prior to using medication if they have concerns.    Expresses verbal understanding and  consents to current therapy and treatment regimen.  No barriers to understanding were identified.  Red flag symptoms and signs discussed in detail.  Patient expressed understanding regarding what to do in case of emergency\urgent symptoms  Please see AVS handed out to patient at the end of our visit for further patient instructions/ counseling done pertaining to today's office visit.   Return for let me know what your home Bp's are running over next 2 wks. then contact us- .     Note:  This note was prepared with assistance of Dragon voice recognition software. Occasional wrong-word or sound-a-like substitutions may have occurred due to the inherent limitations of voice recognition software.   This document serves as a record of services personally performed by Mellody Dance, DO. It was created on her behalf by Toni Amend, a trained medical scribe. The creation of this record is based on the scribe's personal observations and the provider's statements to them.   I have reviewed the above medical documentation for accuracy and completeness and I concur.  Mellody Dance, DO 10/12/2018 9:28 AM        --------------------------------------------------------------------------------------------------------------------------------------------------------------------------------------------------------------------------------------------    Subjective:     HPI: Abigail Cruz is a 53 y.o. female who presents to [place] [facility] today for issues as discussed below.  Notes she's gained some weight; "I've had a hell of an 8 months."  States her son is getting married, with a baby on the way, and she is unhappy about this.  Notes she likes her daughter's significant other "more than  my son right now."  - Skin Tag/Concern The skin tag emerged 6 years ago.  Notes it was small when it started, and began as a "raised bump."  The patient believes the majority of  growth began over the last ten months.  States the tag has been present, been there, and growing since; getting bigger and bigger and more irritating.  Says it's "right where her jeans are," now causing irritation and pain.  Denies bleeding in the area.  Patient states "I'm not even sure it's a skin tag."   Wt Readings from Last 3 Encounters:  10/11/18 290 lb (131.5 kg)  05/18/18 267 lb (121.1 kg)  02/16/18 252 lb (114.3 kg)   BP Readings from Last 3 Encounters:  10/11/18 (!) 155/96  05/18/18 124/90  02/16/18 131/84   Pulse Readings from Last 3 Encounters:  10/11/18 96  05/18/18 70  02/16/18 76   BMI Readings from Last 3 Encounters:  10/11/18 53.04 kg/m  05/18/18 50.87 kg/m  02/16/18 46.09 kg/m     Patient Care Team    Relationship Specialty Notifications Start End  Mellody Dance, DO PCP - General Family Medicine  08/10/15   Eustace Moore, MD Consulting Physician Neurosurgery  11/06/17    Comment: back pain     Patient Active Problem List   Diagnosis Date Noted  . Obesity, Class III, BMI 40-49.9 (morbid obesity) ([place]) 04/27/2017    Priority: High  . Hypertriglyceridemia without hypercholesterolemia 08/12/2015    Priority: High  . Low serum HDL 08/12/2015    Priority: High  . Chronic low back pain 02/16/2018    Priority: Medium  . Snoring (mom with OSA)  11/10/2015    Priority: Medium  . 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 Vitamin D deficiency 08/11/2015    Priority: Medium  . Family history of breast cancer in female- mat aunt 02/16/2018    Priority: Low    Class: Family History of  . Plantar fascia syndrome- R  04/27/2017    Priority: Low  . Environmental and seasonal allergies 08/11/2015    Priority: Low  . Family history of diabetes mellitus- MOM 08/11/2015    Priority: Low  . Skin lesion of left lower extremity 03/22/2017  . Bad dreams 11/10/2015  . Vivid dream 11/10/2015  . Dream enactment  behavior 11/10/2015  . Borderline diastolic hypertension 99991111  . h/o Nephrolithiasis  08/11/2015  . H/O tubal ligation 08/11/2015  . Family history of stroke 08/11/2015  . Family history of hypertension 08/11/2015    Past Medical history, Surgical history, Family history, Social history, Allergies and Medications have been entered into the medical record, reviewed and changed as needed.    Current Meds  Medication Sig  . acetaminophen (TYLENOL) 325 MG tablet Take 975 mg by mouth as needed.   . fexofenadine (ALLEGRA) 180 MG tablet Take 180 mg by mouth daily.  . medroxyPROGESTERone (PROVERA) 5 MG tablet Take one tablet a day for 5 days every month if no spontaneous menses  . Vitamin D, Ergocalciferol, (DRISDOL) 1.25 MG (50000 UT) CAPS capsule TAKE 1 CAPSULE BY MOUTH 2 TIMES A WEEK.    Allergies:  Allergies  Allergen Reactions  . Bee Venom Anaphylaxis  . Iodine Dermatitis and Rash  . Other Anaphylaxis    ANT bites  . Latex Other (See Comments)  . Tape     Adhesives blisters  . Shellfish-Derived Products Palpitations and Rash  Review of Systems:  A fourteen system review of systems was performed and found to be positive as per HPI.   Objective:   Blood pressure (!) 155/96, pulse 96, temperature 98.6 F (37 C), resp. rate 16, height 5\' 2"  (1.575 m), weight 290 lb (131.5 kg), SpO2 98 %. Body mass index is 53.04 kg/m. General:  Well Developed, well nourished, appropriate for stated age.  Neuro:  Alert and oriented,  extra-ocular muscles intact  HEENT:  Normocephalic, atraumatic, neck supple Skin:  3.5 cm by 3 cm lobulated skin lesion/growth, and the base of it is at least 1.5-2 cm diameter.  Non-tender.  Adipose tissue appears slightly fibrous.  No gross rash, warm, pink. Respiratory:   Not using accessory muscles, speaking in full sentences- unlabored. Vascular:  Ext warm, no cyanosis apprec.; cap RF less 2 sec. Psych:  No HI/SI, judgement and insight good,  Euthymic mood. Full Affect.

## 2018-10-30 ENCOUNTER — Telehealth: Payer: Self-pay | Admitting: Family Medicine

## 2018-10-30 NOTE — Telephone Encounter (Signed)
I see no respiratory diagnosis besides allergies and snoring on problem list, please advise on request below

## 2018-10-30 NOTE — Telephone Encounter (Signed)
Patient is working at [employer] and they are [employer] for use while on site. They are giving the option for face shields over using face masks with a doctor's note, and patient states that with her respiratory history that having to wear a mask that long would be hard on her. She is hoping Dr. Jenetta Downer will take that into account and be ok with a letter drafted by our office so she can go with a face shield. Please advise

## 2018-10-31 NOTE — Telephone Encounter (Signed)
Please let patient know her history of reactive airway disease or seasonal allergies does not constitute reasons for use of the facial shield over face mask.    Also, please let her know, in fact, I highly recommend she use an actual face mask during work days to minimize her risk of transmission as she is in a higher risk group and this is what is absolutely best for her.  I would much rather her use a face mask OVER face shield

## 2018-11-05 ENCOUNTER — Encounter: Payer: Self-pay | Admitting: Family Medicine

## 2018-11-20 ENCOUNTER — Telehealth: Payer: Self-pay | Admitting: Family Medicine

## 2018-11-20 NOTE — Telephone Encounter (Signed)
Spoke with the patient and advised that we would not be able to rx any meds for issue that we have not seen her for. Advised patient if she was unable to wait for her apt tomorrow with Valetta Fuller that I suggest her go to UC. Patient states she doesn't want to do that and will wait for her apt. AS, CMA

## 2018-11-20 NOTE — Progress Notes (Deleted)
Subjective:    Patient ID: Abigail Cruz, female    DOB: 05-23-1965, 53 y.o.   MRN: [id number]  HPI:  Ms. Abigail Cruz presents with   Patient Care Team    Relationship Specialty Notifications Start End  Mellody Dance, DO PCP - General Family Medicine  08/10/15   Eustace Moore, MD Consulting Physician Neurosurgery  11/06/17    Comment: back pain    Patient Active Problem List   Diagnosis Date Noted  . Chronic low back pain 02/16/2018  . Family history of breast cancer in female- mat aunt 02/16/2018    Class: Family History of  . Obesity, Class III, BMI 40-49.9 (morbid obesity) ([place]) 04/27/2017  . Plantar fascia syndrome- R  04/27/2017  . Skin lesion of left lower extremity 03/22/2017  . Bad dreams 11/10/2015  . Vivid dream 11/10/2015  . Snoring (mom with OSA)  11/10/2015  . 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 Borderline diastolic hypertension 99991111  . Hypertriglyceridemia without hypercholesterolemia 08/12/2015  . Low serum HDL 08/12/2015  . 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 Environmental and seasonal allergies 08/11/2015  . h/o Nephrolithiasis  08/11/2015  . H/O tubal ligation 08/11/2015  . Family history of diabetes mellitus- MOM 08/11/2015  . Family history of stroke 08/11/2015  . Family history of hypertension 08/11/2015  . Vitamin D deficiency 08/11/2015     Past Medical History:  Diagnosis Date  . Abnormal Pap smear of cervix    in her 20's  . Asthma   . Chronic kidney disease    kidney stones  . Pneumonia      Past Surgical History:  Procedure Laterality Date  . bladder stretching    . CHOLECYSTECTOMY    . LITHOTRIPSY    . OTHER SURGICAL HISTORY     peritoneal cyst removal  . TUBAL LIGATION       Family History  Problem Relation Age of Onset  . Hypertension Mother   . Diabetes Mother   . Cancer Mother        cervical  . Hypertension Father   . Cancer Maternal Aunt        breast  . Cancer Paternal  Aunt        breast  . Heart attack Paternal Grandfather   . Stroke Paternal Grandfather   . Cancer Maternal Uncle        breast cancer  . Cervical cancer Cousin      Social History   Substance and Sexual Activity  Drug Use No     Social History   Substance and Sexual Activity  Alcohol Use No     Social History   Tobacco Use  Smoking Status Never Smoker  Smokeless Tobacco Never Used     Outpatient Encounter Medications as of 11/21/2018  Medication Sig  . acetaminophen (TYLENOL) 325 MG tablet Take 975 mg by mouth as needed.   . fexofenadine (ALLEGRA) 180 MG tablet Take 180 mg by mouth daily.  . medroxyPROGESTERone (PROVERA) 5 MG tablet Take one tablet a day for 5 days every month if no spontaneous menses  . Vitamin D, Ergocalciferol, (DRISDOL) 1.25 MG (50000 UT) CAPS capsule TAKE 1 CAPSULE BY MOUTH 2 TIMES A WEEK.   No facility-administered encounter medications on file as of 11/21/2018.     Allergies: Bee venom, Iodine, Other, Latex, Tape, and Shellfish-derived products  There is no height or weight on file to calculate BMI.  There were no vitals taken for this  visit.     Review of Systems     Objective:   Physical Exam        Assessment & Plan:  No diagnosis found.  No problem-specific Assessment & Plan notes found for this encounter.    FOLLOW-UP:  No follow-ups on file.

## 2018-11-20 NOTE — Telephone Encounter (Signed)
Forwarding message to medical assistant Patient called 11/2 stating Rt side facial pain unsure if Ear or Tooth advised neither provider had available appt 11/2--suggested Urgent Care for immediate medical attention.   --Pt called back today 11/3 states will see NP if PCP still has nothing available ( but needed the earliest or latest appt of the day)  --Made Telehealth with Valetta Fuller for 4pm on 11/4.   ----- request to ( Dr. Raliegh Scarlet) to see if an Rx can be called in for patient to :  [place], [place] - [place] [phone] (Phone) [phone] (Fax)     -- glh

## 2018-11-21 ENCOUNTER — Ambulatory Visit (INDEPENDENT_AMBULATORY_CARE_PROVIDER_SITE_OTHER): Payer: BC Managed Care – PPO | Admitting: Family Medicine

## 2018-11-21 ENCOUNTER — Other Ambulatory Visit: Payer: Self-pay

## 2018-11-21 ENCOUNTER — Encounter: Payer: Self-pay | Admitting: Family Medicine

## 2018-11-21 DIAGNOSIS — J019 Acute sinusitis, unspecified: Secondary | ICD-10-CM | POA: Diagnosis not present

## 2018-11-21 DIAGNOSIS — J309 Allergic rhinitis, unspecified: Secondary | ICD-10-CM | POA: Diagnosis not present

## 2018-11-21 DIAGNOSIS — H6690 Otitis media, unspecified, unspecified ear: Secondary | ICD-10-CM | POA: Diagnosis not present

## 2018-11-21 NOTE — Progress Notes (Signed)
Virtual / live video office visit note for [employer], D.O- Primary Care Physician at [facility]   I connected with current patient today and beyond visually recognizing the correct individual, I verified that I am speaking with the correct person using two identifiers.  . Location of the patient: Home . Location of the provider: Office Only the patient (+/- their family members at pt's discretion) and myself were participating in the encounter    - This visit type was conducted due to national recommendations for restrictions regarding the COVID-19 Pandemic (e.g. social distancing) in an effort to limit this patient's exposure and mitigate transmission in our community.  This format is felt to be most appropriate for this patient at this time.   - The patient did have access to video technology today  - No physical exam could be performed with this format, beyond that communicated to [place] by the patient/ family members as noted.   - Additionally my office staff/ schedulers discussed with the patient that there may be a monetary charge related to this service, depending on patient's medical insurance.   The patient expressed understanding, and agreed to proceed.      History of Present Illness:  I, Toni Amend, am serving as scribe for Dr. Mellody Dance.  - Ear Pain Notes her head hurts really bad.  States pain is located "back behind the ear."  Says she has a camera she can put into her ear and notes "can see a bunch of fluid, red in there," but not what she would expect from an infection.  Says "I haven't seen any infection like you might see in a kid."  Says she placed peroxide in her ear because she's used this "to bubble stuff out in the past," however this did not work this time.  She also tried Swimmer's Ear treatment on Sunday night.  This did give her a little bit of relief, "but I think it was mainly because of the cold, because it was short-lived."  Says she  slept with a "bandage underneath [her ear]" when fluid was coming out.  Notes the ear still hurts and started to affect her jaw yesterday; while talking on the phone with CMA, her jaw area started to hurt.  Has her son's wedding in two weeks, flying to [place].  Says "I need to get it nipped" before that.  Thought her symptoms were sinus related at first and was taking Allegra daily; thinks the pain started probably Tuesday last week, lasting for a week, week and a half.  Says "I felt like I had a little fever yesterday morning; I was surprised that I didn't register when I went into work."  Feels her temp has been "high for her" but low-grade.  At work, her temp has never been above 99.  Denies nausea, vomiting.  Denies sore throat, runny nose.  Says "I feel like [my nose has been] more clogged than anything."   Depression screen [facility] 2/9 11/21/2018 10/11/2018 02/16/2018 02/16/2018 12/25/2017  Decreased Interest 0 0 0 0 0  Down, Depressed, Hopeless 0 0 0 0 0  PHQ - 2 Score 0 0 0 0 0  Altered sleeping 0 0 0 0 0  Tired, decreased energy 0 0 0 0 0  Change in appetite 0 0 0 0 0  Feeling bad or failure about yourself  0 0 0 0 0  Trouble concentrating 0 0 0 0 0  Moving slowly or  fidgety/restless 0 0 0 0 0  Suicidal thoughts 0 0 0 0 0  PHQ-9 Score 0 0 0 0 0  Difficult doing work/chores Not difficult at all Not difficult at all Not difficult at all Not difficult at all Not difficult at all  Some recent data might be hidden    No flowsheet data found.   Impression and Recommendations:    1. Acute otitis media, unspecified otitis media type   2. Acute sinusitis, recurrence not specified, unspecified location   3. Allergic rhinitis, unspecified seasonality, unspecified trigger   4. Allergic sinusitis      Ear Pain - Lengthy conversation held regarding symptoms today.  All questions answered. - Discussed that symptoms resemble an internal ear infection, given radiation of pain into the  jaw.  - Discussed that as this visit was held over the phone, we cannot ascertain whether there is an external ear infection.  - Recommended beginning oral antibiotics.  See med list. - In the past, patient remembers taking augmentin for sinus infection. - Per patient, "amoxicillin never did much for me." - Augmentin prescribed today.  - Discussed indications for steroids with patient today. - Advised waiting on oral steroids, but beginning localized nasal steroid. - Recommended use of sinus rinses (patient owns a NetiPot) daily BID, followed by one spray of nasal steroid, Flonase, into each nostril.  - For chronic ear care, advised patient to never use straight hydrogen peroxide in her ear; always use half rubbing alcohol, half hydrogen peroxide.  - Discussed that if her ear continues feeling strange, pt should return for examination in person.  However, advised patient that she needs a negative COVID test before returning to office for in-person OV.  - Patient declines interest in obtaining COVID test today.  - If patient's fatigue worsens, or if she develops fevers or high-normal temperature, advised patient to obtain a COVID test at once.  - Advised patient to call in or return to clinic if symptoms worsen or fail to improve.   - As part of my medical decision making, I reviewed the following data within the [place] History obtained from pt /family, CMA notes reviewed and incorporated if applicable, Labs reviewed, Radiograph/ tests reviewed if applicable and OV notes from prior OV's with me, as well as other specialists she/he has seen since seeing me last, were all reviewed and used in my medical decision making process today.   - Additionally, discussion had with patient regarding txmnt plan, their biases about that plan etc were used in my medical decision making today.   - The patient agreed with the plan and demonstrated an understanding of the instructions.    No barriers to understanding were identified.   - Red flag symptoms and signs discussed in detail.  Patient expressed understanding regarding what to do in case of emergency\ urgent symptoms.  The patient was advised to call back or seek an in-person evaluation if the symptoms worsen or if the condition fails to improve as anticipated.   No follow-ups on file.    No orders of the defined types were placed in this encounter.   Meds ordered this encounter  Medications  . amoxicillin-clavulanate (AUGMENTIN) 875-125 MG tablet    Sig: Take 1 tablet by mouth 2 (two) times daily for 10 days.    Dispense:  20 tablet    Refill:  0  . fluticasone (FLONASE) 50 MCG/ACT nasal spray    Sig: 1 spray each nostril following sinus  rinses twice daily    Dispense:  16 g    Refill:  11    Medications Discontinued During This Encounter  Medication Reason  . medroxyPROGESTERone (PROVERA) 5 MG tablet Error      Note:  This note was prepared with assistance of Dragon voice recognition software. Occasional wrong-word or sound-a-like substitutions may have occurred due to the inherent limitations of voice recognition software.   This document serves as a record of services personally performed by Mellody Dance, DO. It was created on her behalf by Toni Amend, a trained medical scribe. The creation of this record is based on the scribe's personal observations and the provider's statements to them.   This case required medical decision making of at least moderate complexity. The above documentation has been reviewed to be accurate and was completed by Marjory Sneddon, D.O.      Patient Care Team    Relationship Specialty Notifications Start End  Mellody Dance, DO PCP - General Family Medicine  08/10/15   Eustace Moore, MD Consulting Physician Neurosurgery  11/06/17    Comment: back pain    -Vitals obtained; medications/ allergies reconciled;  personal medical, social, Sx  etc.histories were updated by CMA, reviewed by me and are reflected in chart  Patient Active Problem List   Diagnosis Date Noted  . Obesity, Class III, BMI 40-49.9 (morbid obesity) ([place]) 04/27/2017    Priority: High  . Hypertriglyceridemia without hypercholesterolemia 08/12/2015    Priority: High  . Low serum HDL 08/12/2015    Priority: High  . Chronic low back pain 02/16/2018    Priority: Medium  . Snoring (mom with OSA)  11/10/2015    Priority: Medium  . 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 Vitamin D deficiency 08/11/2015    Priority: Medium  . Family history of breast cancer in female- mat aunt 02/16/2018    Priority: Low    Class: Family History of  . Plantar fascia syndrome- R  04/27/2017    Priority: Low  . Environmental and seasonal allergies 08/11/2015    Priority: Low  . Family history of diabetes mellitus- MOM 08/11/2015    Priority: Low  . Skin lesion of left lower extremity 03/22/2017  . Bad dreams 11/10/2015  . Vivid dream 11/10/2015  . 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 Borderline diastolic hypertension 99991111  . h/o Nephrolithiasis  08/11/2015  . H/O tubal ligation 08/11/2015  . Family history of stroke 08/11/2015  . Family history of hypertension 08/11/2015     No outpatient medications have been marked as taking for the 11/21/18 encounter (Office Visit) with Mellody Dance, DO.     Allergies  Allergen Reactions  . Bee Venom Anaphylaxis  . Iodine Dermatitis and Rash  . Other Anaphylaxis    ANT bites  . Latex Other (See Comments)  . Tape     Adhesives blisters  . Shellfish-Derived Products Palpitations and Rash     ROS:  See above HPI for pertinent positives and negatives   Objective:   Last menstrual period 11/20/2018.  (if some vitals are omitted, this means that patient was UNABLE to obtain them even though they were asked to get them prior to [place] today.  They were asked to call us  at their earliest convenience with these once obtained.)  General: A & O * 3; visually in no acute distress; in usual state of health.  Skin: Visible skin appears normal and pt's  usual skin color HEENT:  EOMI, head is normocephalic and atraumatic.  Sclera are anicteric. Neck has a good range of motion.  Lips are noncyanotic Chest: normal chest excursion and movement Respiratory: speaking in full sentences, no conversational dyspnea; no use of accessory muscles Psych: insight good, mood- appears full

## 2018-11-22 MED ORDER — AMOXICILLIN-POT CLAVULANATE 875-125 MG PO TABS: 1.0000 | ORAL_TABLET | Freq: Two times a day (BID) | ORAL | 0 refills | Status: AC

## 2018-11-22 MED ORDER — FLUTICASONE PROPIONATE 50 MCG/ACT NA SUSP: NASAL | 11 refills | Status: AC

## 2018-12-10 ENCOUNTER — Telehealth: Payer: Self-pay | Admitting: Family Medicine

## 2018-12-10 NOTE — Telephone Encounter (Signed)
Patient called states was told by provider/ Dr. Raliegh Scarlet via text msg ) that she can be seen 11/24 early am( no available appt till 9:45 (10:00am).  Per Patient, provider says will work pt in.   ---Forwarding message to [facility]. [place] for scheduling.  -glh

## 2018-12-10 NOTE — Telephone Encounter (Signed)
Per Dr. Raliegh Scarlet this did not happen.  Please advise patient that we dont have an open apt slot.  Patient can take next available apt or Urgent care for dog bite.  I attempted to contact patient with no answer.

## 2018-12-11 NOTE — Progress Notes (Signed)
Subjective:    Patient ID: Abigail Cruz, female    DOB: 02-May-1965, 53 y.o.   MRN: [id number]  HPI:  Ms. Threats is here for a dog bite that she sustained 12/06/2018 at [place] near [place], [place]. She was bite by a [place] -dog was collared and leashed with owners. She was not able to obtained the dogs vaccination status. Dog bit her once on RLE. She cleaned wound with bottled water dial soap, then cleansed with peroxide. Dressed with guaze and a pressure dressing. Per pt dressing was saturated within one hour- dressing changed. Since then she has been washing multiple times with warm water and soap, applying tegaderms. Wound it OTA at OV- recommended to keep OTA to promote healing.  Pain is constant, described as "hot poker", 5/10 at rest, 7/20 with palpation. Pain worsens with touch and when walking. OTC remedies-none  [facility] TeleDoc Sunday 12/09/2018- instructed to f/u with her PCP- no care rendered. She returned Monday 12/10/2018 into [place]  She denies numbness/tingling  She denies fever/night sweats/malaise/change in appetite  She is not using any blood thinners or ASA  She is not a tobacco/vape user  Lab Results  Component Value Date   HGBA1C 5.1 11/17/2017   HGBA1C 5.1 11/08/2016   HGBA1C 5.1 08/11/2015    Patient Care Team    Relationship Specialty Notifications Start End  Mellody Dance, DO PCP - General Family Medicine  08/10/15   Eustace Moore, MD Consulting Physician Neurosurgery  11/06/17    Comment: back pain    Patient Active Problem List   Diagnosis Date Noted  . Dog bite of calf 12/12/2018  . Chronic low back pain 02/16/2018  . Family history of breast cancer in female- mat aunt 02/16/2018    Class: Family History of  . Obesity, Class III, BMI 40-49.9 (morbid obesity) ([place]) 04/27/2017  . Plantar fascia syndrome- R  04/27/2017  . Skin lesion of left lower extremity 03/22/2017  . Bad dreams 11/10/2015  . Vivid dream  11/10/2015  . Snoring (mom with OSA)  11/10/2015  . 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 Borderline diastolic hypertension 99991111  . Hypertriglyceridemia without hypercholesterolemia 08/12/2015  . Low serum HDL 08/12/2015  . 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 Environmental and seasonal allergies 08/11/2015  . h/o Nephrolithiasis  08/11/2015  . H/O tubal ligation 08/11/2015  . Family history of diabetes mellitus- MOM 08/11/2015  . Family history of stroke 08/11/2015  . Family history of hypertension 08/11/2015  . Vitamin D deficiency 08/11/2015     Past Medical History:  Diagnosis Date  . Abnormal Pap smear of cervix    in her 20's  . Asthma   . Chronic kidney disease    kidney stones  . Pneumonia      Past Surgical History:  Procedure Laterality Date  . bladder stretching    . CHOLECYSTECTOMY    . LITHOTRIPSY    . OTHER SURGICAL HISTORY     peritoneal cyst removal  . TUBAL LIGATION       Family History  Problem Relation Age of Onset  . Hypertension Mother   . Diabetes Mother   . Cancer Mother        cervical  . Hypertension Father   . Cancer Maternal Aunt        breast  . Cancer Paternal Aunt        breast  . Heart attack Paternal Grandfather   .  Stroke Paternal Grandfather   . Cancer Maternal Uncle        breast cancer  . Cervical cancer Cousin      Social History   Substance and Sexual Activity  Drug Use No     Social History   Substance and Sexual Activity  Alcohol Use No     Social History   Tobacco Use  Smoking Status Never Smoker  Smokeless Tobacco Never Used     Outpatient Encounter Medications as of 12/12/2018  Medication Sig  . acetaminophen (TYLENOL) 325 MG tablet Take 975 mg by mouth as needed.   . fexofenadine (ALLEGRA) 180 MG tablet Take 180 mg by mouth daily.  . fluticasone (FLONASE) 50 MCG/ACT nasal spray 1 spray each nostril following sinus rinses twice daily  . Vitamin D,  Ergocalciferol, (DRISDOL) 1.25 MG (50000 UT) CAPS capsule TAKE 1 CAPSULE BY MOUTH 2 TIMES A WEEK.  Marland Kitchen amoxicillin-clavulanate (AUGMENTIN) 875-125 MG tablet Take 1 tablet by mouth 2 (two) times daily.   No facility-administered encounter medications on file as of 12/12/2018.     Allergies: Bee venom, Iodine, Other, Latex, Tape, and Shellfish-derived products  Body mass index is 57.27 kg/m.  Blood pressure (!) 146/78, pulse 90, temperature 98.6 F (37 C), temperature source Oral, height 5' 0.75" (1.543 m), weight (!) 300 lb 9.6 oz (136.4 kg), last menstrual period 11/20/2018, SpO2 99 %.  Review of Systems  Constitutional: Positive for fatigue. Negative for activity change, appetite change, chills, diaphoresis, fever and unexpected weight change.  HENT: Negative for congestion.   Eyes: Negative for visual disturbance.  Respiratory: Negative for cough, chest tightness, shortness of breath, wheezing and stridor.   Cardiovascular: Negative for chest pain, palpitations and leg swelling.  Gastrointestinal: Negative for abdominal distention, abdominal pain, blood in stool, constipation, diarrhea, nausea and vomiting.  Endocrine: Negative for polydipsia, polyphagia and polyuria.  Musculoskeletal: Positive for gait problem and myalgias.  Skin: Positive for color change and wound.  Neurological: Negative for dizziness and headaches.  Hematological: Negative for adenopathy. Does not bruise/bleed easily.       Objective:   Physical Exam Vitals signs and nursing note reviewed.  Constitutional:      General: She is not in acute distress.    Appearance: Normal appearance. She is obese. She is not ill-appearing, toxic-appearing or diaphoretic.  Cardiovascular:     Rate and Rhythm: Normal rate and regular rhythm.     Pulses: Normal pulses.     Heart sounds: Normal heart sounds. No murmur. No friction rub. No gallop.   Pulmonary:     Effort: Pulmonary effort is normal. No respiratory distress.      Breath sounds: Normal breath sounds. No stridor. No wheezing, rhonchi or rales.  Chest:     Chest wall: No tenderness.  Musculoskeletal:        General: Tenderness present.     Right lower leg: She exhibits tenderness and swelling. She exhibits no bony tenderness. Edema present.     Comments: Strong bilateral pedal pulses   Skin:    General: Skin is warm.     Capillary Refill: Capillary refill takes less than 2 seconds.     Findings: Bruising and erythema present.          Comments: Lateral distal RLE- 7cm x 6.cm area erythema  Two puncture wounds noted, proximal one measuring in 0.6cm in diameter, distal measuring 1 cm in diameter. No drainage or streaking noted. No excessive warmth noted   Neurological:  Mental Status: She is alert and oriented to person, place, and time.  Psychiatric:        Mood and Affect: Mood normal.        Behavior: Behavior normal.        Thought Content: Thought content normal.        Judgment: Judgment normal.        Assessment & Plan:   1. Need for Tdap vaccination   2. Dog bite, initial encounter   3. Dog bite of right calf, initial encounter     Dog bite of calf Tetanus vaccination updated. Please take Augmentin as directed. Follow wound care as directed above. Since you are unaware of the dog's vaccination status- you need Rabies Vaccine and Immune Globulin- proceed to nearest ED or the Health Department for these injections ASAP. Follow-up as needed. Continue to social distance and wear a mask when in public.    FOLLOW-UP:  Return if symptoms worsen or fail to improve.

## 2018-12-12 ENCOUNTER — Ambulatory Visit: Payer: BC Managed Care – PPO | Admitting: Adult Health

## 2018-12-12 ENCOUNTER — Other Ambulatory Visit: Payer: Self-pay

## 2018-12-12 VITALS — BP 146/78 | HR 90 | Temp 98.6°F | Ht 60.75 in | Wt 300.6 lb

## 2018-12-12 DIAGNOSIS — S81859A Open bite, unspecified lower leg, initial encounter: Secondary | ICD-10-CM | POA: Insufficient documentation

## 2018-12-12 DIAGNOSIS — M79604 Pain in right leg: Secondary | ICD-10-CM

## 2018-12-12 DIAGNOSIS — Z23 Encounter for immunization: Secondary | ICD-10-CM

## 2018-12-12 DIAGNOSIS — S81851A Open bite, right lower leg, initial encounter: Secondary | ICD-10-CM

## 2018-12-12 DIAGNOSIS — W540XXA Bitten by dog, initial encounter: Secondary | ICD-10-CM | POA: Diagnosis not present

## 2018-12-12 MED ORDER — AMOXICILLIN-POT CLAVULANATE 875-125 MG PO TABS: 1.0000 | ORAL_TABLET | Freq: Two times a day (BID) | ORAL | 0 refills | Status: DC

## 2018-12-12 NOTE — Patient Instructions (Addendum)
Animal Bite, Adult Animal bites range from mild to serious. An animal bite can result in any of these injuries:  A scratch.  A deep, open cut.  A puncture of the skin.  A crush injury.  Tearing away of the skin or a body part.  A bone injury. A small bite from a house pet is usually less serious than a bite from a stray or wild animal, such as a raccoon, fox, skunk, or bat. That is because stray and wild animals have a higher risk of carrying a serious infection called rabies, which can be passed to humans through a bite. What increases the risk? You are more likely to be bitten by an animal if:  You are around unfamiliar pets.  You disturb an animal when it is eating, sleeping, or caring for its babies.  You are outdoors in a place where small, wild animals roam freely. What are the signs or symptoms? Common symptoms of an animal bite include:  Pain.  Bleeding.  Swelling.  Bruising. How is this diagnosed? This condition may be diagnosed based on a physical exam and medical history. Your health care provider will examine your wound and ask for details about the animal and how the bite happened. You may also have tests, such as:  Blood tests to check for infection.  X-rays to check for damage to bones or joints.  Taking a fluid sample from your wound and checking it for infection (culture test). How is this treated? Treatment varies depending on the type of animal, where the bite is on your body, and your medical history. Treatment may include:  Caring for the wound. This often includes cleaning the wound, rinsing out (flushing) the wound with saline solution, and applying a bandage (dressing). In some cases, the wound may be closed with stitches (sutures), staples, skin glue, or adhesive strips.  Antibiotic medicine to prevent or treat infection. This medicine may be prescribed in pill or ointment form. If the bite area becomes infected, the medicine may be given through  an IV.  A tetanus shot to prevent tetanus infection.  Rabies treatment to prevent rabies infection. This will be done if the animal could have rabies.  Surgery. This may be done if a bite gets infected or if there is damage that needs to be repaired. Follow these instructions at home: Wound care   Follow instructions from your health care provider about how to take care of your wound. Make sure you: ? Wash your hands with soap and water before you change your dressing. If soap and water are not available, use hand sanitizer. ? Change your dressing as told by your health care provider. ? Leave sutures, skin glue, or adhesive strips in place. These skin closures may need to stay in place for 2 weeks or longer. If adhesive strip edges start to loosen and curl up, you may trim the loose edges. Do not remove adhesive strips completely unless your health care provider tells you to do that.  Check your wound every day for signs of infection. Check for: ? More redness, swelling, or pain. ? More fluid or blood. ? Warmth. ? Pus or a bad smell. Medicines  Take or apply over-the-counter and prescription medicines only as told by your health care provider.  If you were prescribed an antibiotic, take or apply it as told by your health care provider. Do not stop using the antibiotic even if your condition improves. General instructions   Keep the injured   area raised (elevated) above the level of your heart while you are sitting or lying down, if this is possible.  If directed, put ice on the injured area. ? Put ice in a plastic bag. ? Place a towel between your skin and the bag. ? Leave the ice on for 20 minutes, 2-3 times per day.  Keep all follow-up visits as told by your health care provider. This is important. Contact a health care provider if:  You have more redness, swelling, or pain around your wound.  Your wound feels warm to the touch.  You have a fever or chills.  You have a  general feeling of sickness (malaise).  You feel nauseous or you vomit.  You have pain that does not get better. Get help right away if:  You have a red streak that leads away from your wound.  You have non-clear fluid or more blood coming from your wound.  There is pus or a bad smell coming from your wound.  You have trouble moving your injured area.  You have numbness or tingling that extends beyond the wound. Summary  Animal bites can range from mild to serious. An animal bite can cause a scratch on the skin, a deep open cut, a puncture of the skin, a crush injury, tearing away of the skin or a body part, or a bone injury.  Your health care provider will examine your wound and ask for details about the animal and how the bite happened.  You may also have tests such as a blood test, X-ray, or testing of a fluid sample from your wound (culture test).  Treatment may include wound care, antibiotic medicine, a tetanus shot, and rabies treatment if the animal could have rabies. This information is not intended to replace advice given to you by your health care provider. Make sure you discuss any questions you have with your health care provider. Document Released: 09/21/2010 Document Revised: 12/29/2016 Document Reviewed: 07/14/2016 Elsevier Patient Education  2020 [employer].  Tetanus vaccination updated. Please take Augmentin as directed. Follow wound care as directed above. Since you are unaware of the dog's vaccination status- you need Rabies Vaccine and Immune Globulin- proceed to nearest ED or the Health Department for these injections ASAP. Follow-up as needed. Continue to social distance and wear a mask when in public. FEEL BETTER!

## 2018-12-12 NOTE — Assessment & Plan Note (Signed)
Tetanus vaccination updated. Please take Augmentin as directed. Follow wound care as directed above. Since you are unaware of the dog's vaccination status- you need Rabies Vaccine and Immune Globulin- proceed to nearest ED or the Health Department for these injections ASAP. Follow-up as needed. Continue to social distance and wear a mask when in public.

## 2018-12-24 ENCOUNTER — Ambulatory Visit
Admission: EM | Admit: 2018-12-24 | Discharge: 2018-12-24 | Disposition: A | Discharge status: Home / Self Care | Payer: BC Managed Care – PPO

## 2018-12-24 ENCOUNTER — Encounter: Payer: Self-pay | Admitting: Physician Assistant

## 2018-12-24 ENCOUNTER — Other Ambulatory Visit: Payer: Self-pay

## 2018-12-24 DIAGNOSIS — S81859D Open bite, unspecified lower leg, subsequent encounter: Secondary | ICD-10-CM | POA: Diagnosis not present

## 2018-12-24 DIAGNOSIS — W540XXA Bitten by dog, initial encounter: Secondary | ICD-10-CM

## 2018-12-24 NOTE — ED Triage Notes (Signed)
Pt states has a dog bite x3wks and now the area is painful and hard to touch.

## 2018-12-24 NOTE — Discharge Instructions (Signed)
No alarming signs on exam. No signs of infection at this time. Ibuprofen 800mg  three times a day or naproxen 440mg  twice a day for 5-7 days. Warm compress and elevation at this time. Continue to monitor for spreading redness, increased warmth, follow up for reevaluation needed.

## 2018-12-24 NOTE — ED Provider Notes (Signed)
[facility]    CSN: [id number] Arrival date & time: 12/24/18  1453      History   Chief Complaint Chief Complaint  Patient presents with  . Wound Check    HPI Abigail Cruz is a 53 y.o. female.   53 year old female comes in for dog bite on 12/07/2018. Dog is uptodate on immunization. Was seen on 12/12/2018 and was given tetanus injection, augmentin BID x 7 days. Has been dressing wound daily. Had improvement of symptoms after finishing course of abx. However, has had some increased pain for the past few days with clear drainage to puncture wound. Surrounding erythema that is not spreading. Denies fever, chills, body aches.      Past Medical History:  Diagnosis Date  . Abnormal Pap smear of cervix    in her 20's  . Asthma   . Chronic kidney disease    kidney stones  . Pneumonia     Patient Active Problem List   Diagnosis Date Noted  . Dog bite of calf 12/12/2018  . Chronic low back pain 02/16/2018  . Family history of breast cancer in female- mat aunt 02/16/2018    Class: Family History of  . Obesity, Class III, BMI 40-49.9 (morbid obesity) ([place]) 04/27/2017  . Plantar fascia syndrome- R  04/27/2017  . Skin lesion of left lower extremity 03/22/2017  . Bad dreams 11/10/2015  . Vivid dream 11/10/2015  . Snoring (mom with OSA)  11/10/2015  . 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 Borderline diastolic hypertension 99991111  . Hypertriglyceridemia without hypercholesterolemia 08/12/2015  . Low serum HDL 08/12/2015  . 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 Environmental and seasonal allergies 08/11/2015  . h/o Nephrolithiasis  08/11/2015  . H/O tubal ligation 08/11/2015  . Family history of diabetes mellitus- MOM 08/11/2015  . Family history of stroke 08/11/2015  . Family history of hypertension 08/11/2015  . Vitamin D deficiency 08/11/2015    Past Surgical History:  Procedure Laterality Date  . bladder stretching    .  CHOLECYSTECTOMY    . LITHOTRIPSY    . OTHER SURGICAL HISTORY     peritoneal cyst removal  . TUBAL LIGATION      OB History    Gravida  2   Para  2   Term  2   Preterm      AB      Living  2     SAB      TAB      Ectopic      Multiple      Live Births  2            Home Medications    Prior to Admission medications   Medication Sig Start Date End Date Taking? Authorizing Provider  acetaminophen (TYLENOL) 325 MG tablet Take 975 mg by mouth as needed.     [provider]  amoxicillin-clavulanate (AUGMENTIN) 875-125 MG tablet Take 1 tablet by mouth 2 (two) times daily. 12/12/18   Danford, Valetta Fuller D, NP  fexofenadine (ALLEGRA) 180 MG tablet Take 180 mg by mouth daily.    [provider]  fluticasone Asencion Islam) 50 MCG/ACT nasal spray 1 spray each nostril following sinus rinses twice daily 11/22/18   Opalski, Deborah, DO  Vitamin D, Ergocalciferol, (DRISDOL) 1.25 MG (50000 UT) CAPS capsule TAKE 1 CAPSULE BY MOUTH 2 TIMES A WEEK. 06/13/18   Mellody Dance, DO    Family History Family History  Problem Relation Age  of Onset  . Hypertension Mother   . Diabetes Mother   . Cancer Mother        cervical  . Hypertension Father   . Cancer Maternal Aunt        breast  . Cancer Paternal Aunt        breast  . Heart attack Paternal Grandfather   . Stroke Paternal Grandfather   . Cancer Maternal Uncle        breast cancer  . Cervical cancer Cousin     Social History Social History   Tobacco Use  . Smoking status: Never Smoker  . Smokeless tobacco: Never Used  Substance Use Topics  . Alcohol use: No  . Drug use: No     Allergies   Bee venom, Iodine, Other, Latex, Tape, and Shellfish-derived products   Review of Systems Review of Systems  Reason unable to perform ROS: See HPI as above.     Physical Exam Triage Vital Signs ED Triage Vitals  Enc Vitals Group     BP      Pulse      Resp      Temp      Temp src      SpO2       Weight      Height      Head Circumference      Peak Flow      Pain Score      Pain Loc      Pain Edu?      Excl. in [place]?    No data found.  Updated Vital Signs BP 137/84 (BP Location: Left Arm)   Pulse 75   Temp 98.1 F (36.7 C) (Oral)   Resp 16   SpO2 97%   Physical Exam Constitutional:      General: She is not in acute distress.    Appearance: She is well-developed. She is not diaphoretic.  HENT:     Head: Normocephalic and atraumatic.  Eyes:     Conjunctiva/sclera: Conjunctivae normal.     Pupils: Pupils are equal, round, and reactive to light.  Pulmonary:     Effort: Pulmonary effort is normal. No respiratory distress.  Musculoskeletal:     Comments: See picture below. Well healing puncture wound that is clean and dry. Surrounding erythema without warm. No induration. Surrounding contusion. No significant tenderness to palpation. Calf is soft. Full ROM of knee and ankles.  Skin:    General: Skin is warm and dry.  Neurological:     Mental Status: She is alert and oriented to person, place, and time.        UC Treatments / Results  Labs (all labs ordered are listed, but only abnormal results are displayed) Labs Reviewed - No data to display  EKG   Radiology No results found.  Procedures Procedures (including critical care time)  Medications Ordered in UC Medications - No data to display  Initial Impression / Assessment and Plan / UC Course  I have reviewed the triage vital signs and the nursing notes.  Pertinent labs & imaging results that were available during my care of the patient were reviewed by me and considered in my medical decision making (see chart for details).    No alarming signs on exam. No signs of cellulitis/abscess at this time. Will start NSAIDs, warm compress, elevation at this time and continue to monitor. Return precautions given. Patient expresses understanding and agrees to plan.  Final Clinical Impressions(s) / UC  Diagnoses    Final diagnoses:  Dog bite, initial encounter   ED Prescriptions    None     PDMP not reviewed this encounter.   Ok Edwards, PA-C 12/24/18 1715

## 2019-06-24 ENCOUNTER — Telehealth: Payer: Self-pay

## 2019-06-24 ENCOUNTER — Ambulatory Visit: Payer: BC Managed Care – PPO | Admitting: Obstetrics & Gynecology

## 2019-06-24 NOTE — Telephone Encounter (Signed)
Patient is calling in regards to lower left abdomen pain, being weak and shaky, and feeling off. Patient states she is not feeling like herself. Patient also stated she is premenopausal.

## 2019-06-24 NOTE — Telephone Encounter (Signed)
Patient had not arrived in office, front office staff called patient for update on arrival.   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 Patient states she called to office at 4:05 pm, she states since the phones were off she assumed the office was already closed. Patient went home. Patient reports she is still bleeding heavy, changing saturated pad and ultra tampon q1-1.5 hours. Reports lightheadedness, dizziness, weakness, fatigue. Patient is now home. Time is now 4:45pm, patient lives 24 min away from the office. Advised patient to go directly to the ER for further evaluation. Will provide update to Dr. Sabra Heck and our office will return call if any additional recommendations. Patient asked if she could wait until tomorrow for OV? Advised patient based on symptoms and heavy bleeding, ER recommended now for further evaluation.   Routing to Dr. Lestine Box.

## 2019-06-24 NOTE — Telephone Encounter (Signed)
Call reviewed with Dr. Sabra Heck, call returned to patient. Instructed patient to come on into office now, will be worked into schedule. Patient states she works on the other side of [place], will be 4pm before she can get to the office, lives in [place]. Patient is going to leave work now. Advised patient not to drive herself if feeling weak, lightheaded or dizzy. Patient verbalizes understanding and is agreeable.  Routing to provider for final review. Patient is agreeable to disposition. Will close encounter.  Cc: Dr. Talbert Nan

## 2019-06-24 NOTE — Telephone Encounter (Signed)
Spoke with patient. Spoke with patient. Patient reports she bleed for 4 weeks, flow varied, stopped for 2 days, restarted on 6/5. Reports flow is heavy, changing saturated ultra tampon and pad q1-1.5 hrs. Lightheaded, weak, dizzy when standing and fatigue. "[place]e in LLQ, no pain". Cycles have been monthly prior to this, heavy and longer, but not as long as this past month. Denies SHOB, fever/chills, N/V.  Patient was seen as for GYN consult on 05/18/18. She reports she took cyclic provera x1 8-9 months ago. Advised patient further evaluation is needed, will review with covering provider to determine if she will come into office or go directly to ER, will return call. Patient states she is available for OV.

## 2019-06-25 ENCOUNTER — Encounter: Payer: Self-pay | Admitting: Obstetrics and Gynecology

## 2019-06-25 ENCOUNTER — Ambulatory Visit: Payer: Commercial Managed Care - PPO | Admitting: Obstetrics and Gynecology

## 2019-06-25 ENCOUNTER — Other Ambulatory Visit (HOSPITAL_COMMUNITY)
Admission: RE | Admit: 2019-06-25 | Discharge: 2019-06-25 | Disposition: A | Discharge status: Home / Self Care | Payer: Commercial Managed Care - PPO | Source: Ambulatory Visit | Attending: Obstetrics and Gynecology | Admitting: Obstetrics and Gynecology

## 2019-06-25 ENCOUNTER — Telehealth: Payer: Self-pay | Admitting: Obstetrics and Gynecology

## 2019-06-25 ENCOUNTER — Other Ambulatory Visit: Payer: Self-pay

## 2019-06-25 VITALS — BP 140/74 | HR 80 | Temp 96.5°F | Ht 60.75 in | Wt 312.0 lb

## 2019-06-25 DIAGNOSIS — N921 Excessive and frequent menstruation with irregular cycle: Secondary | ICD-10-CM

## 2019-06-25 LAB — CBC
Hematocrit: 38.1 % (ref 34.0–46.6)
Hemoglobin: 12.8 g/dL (ref 11.1–15.9)
MCH: 28.5 pg (ref 26.6–33.0)
MCHC: 33.6 g/dL (ref 31.5–35.7)
MCV: 85 fL (ref 79–97)
Platelets: 301 10*3/uL (ref 150–450)
RBC: 4.49 x10E6/uL (ref 3.77–5.28)
RDW: 15.4 % (ref 11.7–15.4)
WBC: 10.2 10*3/uL (ref 3.4–10.8)

## 2019-06-25 MED ORDER — NORETHINDRONE ACETATE 5 MG PO TABS: 5.0000 mg | ORAL_TABLET | Freq: Three times a day (TID) | ORAL | 0 refills | Status: DC

## 2019-06-25 NOTE — Patient Instructions (Signed)

## 2019-06-25 NOTE — Progress Notes (Signed)
GYNECOLOGY  VISIT   HPI: 54 y.o.   Married  Caucasian  female   [id number] with Patient's last menstrual period was 05/28/2019 (exact date).   here for heavy vaginal bleeding.   Patient states she had a last cycle 05-28-19 and has been bleeding since. Saturday began bleeding extremely heavy with large blood clots and LLQ pain. She stopped bleeding briefly 5 days ago and then started bleeding again 2 days later.  She is changing her tampons every 1.5 hours and getting up at night as well due to bleeding.  She is needing to use tampons and pads. Her pain in left side is not very significant but present.   December 2019 skipped a cycle and was regular before that.   Jan 2020 had a 2 week menstruation.   She saw Dr. Talbert Nan in May, 2020 and was told she was perimenopausal and given Rx for Provera to start a cycle if needed.  She only used it once to bring on her cycle.  She otherwise had monthly menses.  Each cycle was lasting a full 2 weeks.   States a history of cysts.  She had a peritoneal cyst removed at the time of her tubal ligation.   Having fatigue, shortness of breath, lightheadedness, and weakness.   UPT negative  GYNECOLOGIC HISTORY: Patient's last menstrual period was 05/28/2019 (exact date). Contraception: Tubal Menopausal hormone therapy:  none Last mammogram:  2017 category a density birads 1;neg Last pap smear:   10 years ago        OB History    Gravida  2   Para  2   Term  2   Preterm      AB      Living  2     SAB      TAB      Ectopic      Multiple      Live Births  2              Patient Active Problem List   Diagnosis Date Noted  . Dog bite of calf 12/12/2018  . Chronic low back pain 02/16/2018  . Family history of breast cancer in female- mat aunt 02/16/2018    Class: Family History of  . Obesity, Class III, BMI 40-49.9 (morbid obesity) ([place]) 04/27/2017  . Plantar fascia syndrome- R  04/27/2017  . Skin lesion of left lower  extremity 03/22/2017  . Bad dreams 11/10/2015  . Vivid dream 11/10/2015  . Snoring (mom with OSA)  11/10/2015  . 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 Borderline diastolic hypertension 76/22/6333  . Hypertriglyceridemia without hypercholesterolemia 08/12/2015  . Low serum HDL 08/12/2015  . 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 Environmental and seasonal allergies 08/11/2015  . h/o Nephrolithiasis  08/11/2015  . H/O tubal ligation 08/11/2015  . Family history of diabetes mellitus- MOM 08/11/2015  . Family history of stroke 08/11/2015  . Family history of hypertension 08/11/2015  . Vitamin D deficiency 08/11/2015    Past Medical History:  Diagnosis Date  . Abnormal Pap smear of cervix    in her 20's  . Asthma   . Chronic kidney disease    kidney stones  . Pneumonia     Past Surgical History:  Procedure Laterality Date  . bladder stretching    . CHOLECYSTECTOMY    . LITHOTRIPSY    . OTHER SURGICAL HISTORY     peritoneal cyst removal  . TUBAL LIGATION  Current Outpatient Medications  Medication Sig Dispense Refill  . fexofenadine (ALLEGRA) 180 MG tablet Take 180 mg by mouth daily.    . fluticasone (FLONASE) 50 MCG/ACT nasal spray 1 spray each nostril following sinus rinses twice daily 16 g 11  . Vitamin D, Ergocalciferol, (DRISDOL) 1.25 MG (50000 UT) CAPS capsule TAKE 1 CAPSULE BY MOUTH 2 TIMES A WEEK. 24 capsule 4   No current facility-administered medications for this visit.     ALLERGIES: Bee venom, Iodine, Other, Latex, Tape, and Shellfish-derived products  Family History  Problem Relation Age of Onset  . Hypertension Mother   . Diabetes Mother   . Cancer Mother        cervical  . Hypertension Father   . Cancer Maternal Aunt        breast  . Cancer Paternal Aunt        breast  . Heart attack Paternal Grandfather   . Stroke Paternal Grandfather   . Cancer Maternal Uncle        breast cancer  . Cervical cancer Cousin      Social History   Socioeconomic History  . Marital status: Married    Spouse name: Not on file  . Number of children: Not on file  . Years of education: Not on file  . Highest education level: Not on file  Occupational History  . Not on file  Tobacco Use  . Smoking status: Never Smoker  . Smokeless tobacco: Never Used  Substance and Sexual Activity  . Alcohol use: No  . Drug use: No  . Sexual activity: Yes    Partners: Male    Birth control/protection: Surgical    Comment: btl  Other Topics Concern  . Not on file  Social History Narrative  . Not on file   Social Determinants of Health   Financial Resource Strain:   . Difficulty of Paying Living Expenses:   Food Insecurity:   . Worried About Charity fundraiser in the Last Year:   . Arboriculturist in the Last Year:   Transportation Needs:   . Film/video editor (Medical):   Marland Kitchen Lack of Transportation (Non-Medical):   Physical Activity:   . Days of Exercise per Week:   . Minutes of Exercise per Session:   Stress:   . Feeling of Stress :   Social Connections:   . Frequency of Communication with Friends and Family:   . Frequency of Social Gatherings with Friends and Family:   . Attends Religious Services:   . Active Member of Clubs or Organizations:   . Attends Archivist Meetings:   Marland Kitchen Marital Status:   Intimate Partner Violence:   . Fear of Current or Ex-Partner:   . Emotionally Abused:   Marland Kitchen Physically Abused:   . Sexually Abused:     Review of Systems  All other systems reviewed and are negative.   PHYSICAL EXAMINATION:    BP 140/74 (Cuff Size: Large)   Pulse 80   Temp (!) 96.5 F (35.8 C) (Temporal)   Ht 5' 0.75" (1.543 m)   Wt (!) 312 lb (141.5 kg)   LMP 05/28/2019 (Exact Date)   BMI 59.44 kg/m     General appearance: alert, cooperative and appears stated age Head: Normocephalic, without obvious abnormality, atraumatic Lungs: clear to auscultation bilaterally Heart: regular rate  and rhythm Abdomen: soft, non-tender, no masses,  no organomegaly   No abnormal inguinal nodes palpated   Pelvic: External genitalia:  no lesions              Urethra:  normal appearing urethra with no masses, tenderness or lesions              Bartholins and Skenes: normal                 Vagina: normal appearing vagina with normal color and discharge, no lesions              Cervix: no lesions.  Active bleeding from the cervix. Clot in vagina.                 Bimanual Exam:  Uterus:  normal size, contour, position, consistency, mobility, non-tender              Adnexa: no mass, fullness, tenderness              Rectal exam: Yes.  .  Confirms.              Anus:  normal sphincter tone, no lesions  Endometrial biopsy with rocket.  Consent for procedure.  Sterile prep with Hibiclens.  Paracervical block with 10 cc 1% lidocaine, lot [id number], exp 12/18/19. Rocket passed to 10 cm x 3.  Tissue to pathology.  No complications.  Bleeding appears to be immediately improved.   Chaperone was present for exam.  ASSESSMENT  Menorrhagia with irregular menses.  Status post BTL.  PLAN  We discussed reasons for bleeding - anovulation, polyp, hyperplasia, cancer. FU EMB.  Aygestin 5 mg po tid.  CBC and TSH. [place] this week and follow up appointment with Dr. Talbert Nan.  To the ER if bleeding does not improve.    An After Visit Summary was printed and given to the patient.  __30____ minutes face to face time of which over 50% was spent in counseling.

## 2019-06-25 NOTE — Progress Notes (Signed)
Patient scheduled while in office for PUS on 06/27/19 at 9:30am, consult to follow with  Dr. Talbert Nan. Order placed for precert. Patient notified of appt by provider.

## 2019-06-25 NOTE — Telephone Encounter (Signed)
Spoke with patient. Patient reports she did not go to ER on 6/7 as instructed. (see 06/24/19 telephone encounter). Patient states she showered and went to bed. Has a little more energy today, still fatigued. Only got up x2 during the night to change her pad and tampon, has been getting up x4. Bleeding is not as heavy today, reports "gushes when standing and clots".  Pain in left side has improved. Reports lightheadedness, dizziness, weakness continued. Patient is requesting OV with any provider.   OV scheduled for today at 10am with Dr. Quincy Simmonds. [id number] precautions reviewed, prescreen negative.  Routing to provider for final review. Patient is agreeable to disposition. Will close encounter.  Cc: Dr. Talbert Nan

## 2019-06-25 NOTE — Telephone Encounter (Signed)
Patient says she did not go to emergency room and would like to reschedule appointment.

## 2019-06-26 ENCOUNTER — Telehealth: Payer: Self-pay | Admitting: Obstetrics and Gynecology

## 2019-06-26 LAB — TSH: TSH: 2.2 u[IU]/mL (ref 0.450–4.500)

## 2019-06-26 NOTE — Telephone Encounter (Signed)
Spoke with patient regarding benefits for recommended ultrasound. Patient is aware that ultrasound is transvaginal. Patient acknowledges understanding of information presented. Patient is aware of cancellation policy. Encounter closed.

## 2019-06-27 ENCOUNTER — Ambulatory Visit (INDEPENDENT_AMBULATORY_CARE_PROVIDER_SITE_OTHER): Payer: Commercial Managed Care - PPO

## 2019-06-27 ENCOUNTER — Encounter: Payer: Self-pay | Admitting: Obstetrics and Gynecology

## 2019-06-27 ENCOUNTER — Other Ambulatory Visit: Payer: Self-pay

## 2019-06-27 ENCOUNTER — Ambulatory Visit: Payer: Commercial Managed Care - PPO | Admitting: Obstetrics and Gynecology

## 2019-06-27 VITALS — BP 112/60 | HR 80 | Temp 97.9°F | Ht 62.0 in | Wt 312.0 lb

## 2019-06-27 DIAGNOSIS — N921 Excessive and frequent menstruation with irregular cycle: Secondary | ICD-10-CM

## 2019-06-27 DIAGNOSIS — N951 Menopausal and female climacteric states: Secondary | ICD-10-CM

## 2019-06-27 DIAGNOSIS — Z6841 Body Mass Index (BMI) 40.0 and over, adult: Secondary | ICD-10-CM

## 2019-06-27 LAB — SURGICAL PATHOLOGY

## 2019-06-27 NOTE — Progress Notes (Signed)
GYNECOLOGY  VISIT   HPI: 54 y.o.   Married White or Caucasian Not Hispanic or Latino  female   [phone] with Patient's last menstrual period was 05/28/2019 (exact date).   here for pelvic ultrasound    The patient was seen earlier this week with prolonged heavy episode of bleeding. Endometrial biopsy with degenerating secretory endometrium. Normal CBC and TSH.  Cycles were normal through 12/19. She would bleed for 4-5 days, would use a regular tampon and change it every 3-4 hours. She was seen last year with some a suspected anovulatory bleed and was given a script for provera. She only took it one time.  She then had normal cycles last summer. In the fall she was bleeding monthly x 2-3 weeks. She just finished a 4 week episode of bleeding. She at times could saturate a super + tampon in 3-4 hours. Stopped bleeding a week ago, started bleeding again over the weekend, changing a pad every 1.5 hours. Dr Quincy Simmonds did an office curettage earlier this week. Bleeding is now minimal. She is taking the aygestin 3 x a day  She c/o headaches over the last several months. No history of migraines.  GYNECOLOGIC HISTORY: Patient's last menstrual period was 05/28/2019 (exact date). Contraception: tubal ligation.  Menopausal hormone therapy: none        OB History    Gravida  2   Para  2   Term  2   Preterm      AB      Living  2     SAB      TAB      Ectopic      Multiple      Live Births  2              Patient Active Problem List   Diagnosis Date Noted  . Dog bite of calf 12/12/2018  . Chronic low back pain 02/16/2018  . Family history of breast cancer in female- mat aunt 02/16/2018    Class: Family History of  . Obesity, Class III, BMI 40-49.9 (morbid obesity) ([place]) 04/27/2017  . Plantar fascia syndrome- R  04/27/2017  . Skin lesion of left lower extremity 03/22/2017  . Bad dreams 11/10/2015  . Vivid dream 11/10/2015  . Snoring (mom with OSA)  11/10/2015  . Dream enactment  behavior 11/10/2015  . Borderline diastolic hypertension 76/73/4193  . Hypertriglyceridemia without hypercholesterolemia 08/12/2015  . Low serum HDL 08/12/2015  . 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 Environmental and seasonal allergies 08/11/2015  . h/o Nephrolithiasis  08/11/2015  . H/O tubal ligation 08/11/2015  . Family history of diabetes mellitus- MOM 08/11/2015  . Family history of stroke 08/11/2015  . Family history of hypertension 08/11/2015  . Vitamin D deficiency 08/11/2015    Past Medical History:  Diagnosis Date  . Abnormal Pap smear of cervix    in her 20's  . Asthma   . Chronic kidney disease    kidney stones  . Pneumonia     Past Surgical History:  Procedure Laterality Date  . bladder stretching    . CHOLECYSTECTOMY    . LITHOTRIPSY    . OTHER SURGICAL HISTORY     peritoneal cyst removal  . TUBAL LIGATION      Current Outpatient Medications  Medication Sig Dispense Refill  . fexofenadine (ALLEGRA) 180 MG tablet Take 180 mg by mouth daily.    . fluticasone (FLONASE) 50 MCG/ACT nasal spray 1 spray each  nostril following sinus rinses twice daily 16 g 11  . norethindrone (AYGESTIN) 5 MG tablet Take 1 tablet (5 mg total) by mouth 3 (three) times daily. 90 tablet 0  . Vitamin D, Ergocalciferol, (DRISDOL) 1.25 MG (50000 UT) CAPS capsule TAKE 1 CAPSULE BY MOUTH 2 TIMES A WEEK. 24 capsule 4   No current facility-administered medications for this visit.     ALLERGIES: Bee venom, Iodine, Other, Latex, Tape, and Shellfish-derived products  Family History  Problem Relation Age of Onset  . Hypertension Mother   . Diabetes Mother   . Cancer Mother        cervical  . Hypertension Father   . Cancer Maternal Aunt        breast  . Cancer Paternal Aunt        breast  . Heart attack Paternal Grandfather   . Stroke Paternal Grandfather   . Cancer Maternal Uncle        breast cancer  . Cervical cancer Cousin     Social History    Socioeconomic History  . Marital status: Married    Spouse name: Not on file  . Number of children: Not on file  . Years of education: Not on file  . Highest education level: Not on file  Occupational History  . Not on file  Tobacco Use  . Smoking status: Never Smoker  . Smokeless tobacco: Never Used  Vaping Use  . Vaping Use: Never used  Substance and Sexual Activity  . Alcohol use: No  . Drug use: No  . Sexual activity: Yes    Partners: Male    Birth control/protection: Surgical    Comment: btl  Other Topics Concern  . Not on file  Social History Narrative  . Not on file   Social Determinants of Health   Financial Resource Strain:   . Difficulty of Paying Living Expenses:   Food Insecurity:   . Worried About Charity fundraiser in the Last Year:   . Arboriculturist in the Last Year:   Transportation Needs:   . Film/video editor (Medical):   Marland Kitchen Lack of Transportation (Non-Medical):   Physical Activity:   . Days of Exercise per Week:   . Minutes of Exercise per Session:   Stress:   . Feeling of Stress :   Social Connections:   . Frequency of Communication with Friends and Family:   . Frequency of Social Gatherings with Friends and Family:   . Attends Religious Services:   . Active Member of Clubs or Organizations:   . Attends Archivist Meetings:   Marland Kitchen Marital Status:   Intimate Partner Violence:   . Fear of Current or Ex-Partner:   . Emotionally Abused:   Marland Kitchen Physically Abused:   . Sexually Abused:     Review of Systems  Constitutional: Negative.   HENT: Negative.   Eyes: Negative.   Respiratory: Negative.   Cardiovascular: Negative.   Gastrointestinal: Negative.   Genitourinary: Negative.   Musculoskeletal: Negative.   Skin: Negative.   Neurological: Negative.   Endo/Heme/Allergies: Negative.   Psychiatric/Behavioral: Negative.     PHYSICAL EXAMINATION:    BP 112/60 (BP Location: Left Arm, Patient Position: Sitting, Cuff Size:  Large)   Pulse 80   Temp 97.9 F (36.6 C) (Temporal)   Ht 5\' 2"  (1.575 m)   Wt (!) 312 lb (141.5 kg)   LMP 05/28/2019 (Exact Date)   BMI 57.07 kg/m  General appearance: alert, cooperative and appears stated age  Ultrasound images were reviewed with the patient.   ASSESSMENT Abnormal uterine bleeding, s/p endometrial biopsy earlier this week, degenerative secretory endometrium. Not anemic, normal TSH. Bleeding has slowed down to spotting BMI 57    PLAN She needs to come back for an annual and pap (given ultrasound with gel, today is not a good day to do her pap) She will decrease her aygestin to 2 x a day until after her annual exam We discussed possible treatment options, including OCP's, the mini-pill and mirena IUD. Information given on the mirena IUD Discussed the risks of OCP's which would be elevated with her age and BMI Depo-provera is not a good option with her BMI  In addition to reviewing the ultrasound, over another 20 minutes was spent in total patient care discussing management

## 2019-07-01 ENCOUNTER — Other Ambulatory Visit: Payer: Self-pay | Admitting: Family Medicine

## 2019-07-01 ENCOUNTER — Telehealth: Payer: Self-pay | Admitting: Physician Assistant

## 2019-07-01 DIAGNOSIS — E559 Vitamin D deficiency, unspecified: Secondary | ICD-10-CM

## 2019-07-01 MED ORDER — VITAMIN D (ERGOCALCIFEROL) 1.25 MG (50000 UNIT) PO CAPS: ORAL_CAPSULE | ORAL | 0 refills | Status: DC

## 2019-07-01 NOTE — Telephone Encounter (Signed)
Patient is requesting a refill of her Vit D. If approved please send to [facility]

## 2019-07-01 NOTE — Telephone Encounter (Signed)
6 weeks of med sent to pharmacy. Patient has not been seen since 11/20 and has not had vit d levels checked since 2019. Patient will need apt for further refills. AS, CMA

## 2019-07-01 NOTE — Addendum Note (Signed)
Addended by: Mickel Crow on: 07/01/2019 09:23 AM   Modules accepted: Orders

## 2019-07-02 ENCOUNTER — Other Ambulatory Visit: Payer: Self-pay

## 2019-07-03 ENCOUNTER — Encounter: Payer: Self-pay | Admitting: Obstetrics and Gynecology

## 2019-07-03 ENCOUNTER — Ambulatory Visit: Payer: Commercial Managed Care - PPO | Admitting: Obstetrics and Gynecology

## 2019-07-03 ENCOUNTER — Other Ambulatory Visit (HOSPITAL_COMMUNITY)
Admission: RE | Admit: 2019-07-03 | Discharge: 2019-07-03 | Disposition: A | Discharge status: Home / Self Care | Payer: Commercial Managed Care - PPO | Source: Ambulatory Visit | Attending: Obstetrics and Gynecology | Admitting: Obstetrics and Gynecology

## 2019-07-03 VITALS — BP 130/64 | HR 85 | Temp 97.9°F | Ht 62.0 in | Wt 313.0 lb

## 2019-07-03 DIAGNOSIS — Z1211 Encounter for screening for malignant neoplasm of colon: Secondary | ICD-10-CM

## 2019-07-03 DIAGNOSIS — Z01419 Encounter for gynecological examination (general) (routine) without abnormal findings: Secondary | ICD-10-CM | POA: Diagnosis not present

## 2019-07-03 DIAGNOSIS — Z124 Encounter for screening for malignant neoplasm of cervix: Secondary | ICD-10-CM | POA: Insufficient documentation

## 2019-07-03 DIAGNOSIS — N939 Abnormal uterine and vaginal bleeding, unspecified: Secondary | ICD-10-CM

## 2019-07-03 DIAGNOSIS — N951 Menopausal and female climacteric states: Secondary | ICD-10-CM | POA: Diagnosis not present

## 2019-07-03 MED ORDER — MEDROXYPROGESTERONE ACETATE 5 MG PO TABS
ORAL_TABLET | ORAL | 3 refills | Status: DC
Start: 2019-07-03 — End: 2020-10-23

## 2019-07-03 NOTE — Patient Instructions (Signed)
EXERCISE AND DIET:  We recommended that you start or continue a regular exercise program for good health. Regular exercise means any activity that makes your heart beat faster and makes you sweat.  We recommend exercising at least 30 minutes per day at least 3 days a week, preferably 4 or 5.  We also recommend a diet low in fat and sugar.  Inactivity, poor dietary choices and obesity can cause diabetes, heart attack, stroke, and kidney damage, among others.    ALCOHOL AND SMOKING:  Women should limit their alcohol intake to no more than 7 drinks/beers/glasses of wine (combined, not each!) per week. Moderation of alcohol intake to this level decreases your risk of breast cancer and liver damage. And of course, no recreational drugs are part of a healthy lifestyle.  And absolutely no smoking or even second hand smoke. Most people know smoking can cause heart and lung diseases, but did you know it also contributes to weakening of your bones? Aging of your skin?  Yellowing of your teeth and nails?  CALCIUM AND VITAMIN D:  Adequate intake of calcium and Vitamin D are recommended.  The recommendations for exact amounts of these supplements seem to change often, but generally speaking 1,000 mg of calcium (between diet and supplement) and 800 units of Vitamin D per day seems prudent. Certain women may benefit from higher intake of Vitamin D.  If you are among these women, your doctor will have told you during your visit.    PAP SMEARS:  Pap smears, to check for cervical cancer or precancers,  have traditionally been done yearly, although recent scientific advances have shown that most women can have pap smears less often.  However, every woman still should have a physical exam from her gynecologist every year. It will include a breast check, inspection of the vulva and vagina to check for abnormal growths or skin changes, a visual exam of the cervix, and then an exam to evaluate the size and shape of the uterus and  ovaries.  And after 52 years of age, a rectal exam is indicated to check for rectal cancers. We will also provide age appropriate advice regarding health maintenance, like when you should have certain vaccines, screening for sexually transmitted diseases, bone density testing, colonoscopy, mammograms, etc.   MAMMOGRAMS:  All women over 40 years old should have a yearly mammogram. Many facilities now offer a "3D" mammogram, which may cost around $50 extra out of pocket. If possible,  we recommend you accept the option to have the 3D mammogram performed.  It both reduces the number of women who will be called back for extra views which then turn out to be normal, and it is better than the routine mammogram at detecting truly abnormal areas.    COLON CANCER SCREENING: Now recommend starting at age 45. At this time colonoscopy is not covered for routine screening until 50. There are take home tests that can be done between 45-49.   COLONOSCOPY:  Colonoscopy to screen for colon cancer is recommended for all women at age 50.  We know, you hate the idea of the prep.  We agree, BUT, having colon cancer and not knowing it is worse!!  Colon cancer so often starts as a polyp that can be seen and removed at colonscopy, which can quite literally save your life!  And if your first colonoscopy is normal and you have no family history of colon cancer, most women don't have to have it again for   10 years.  Once every ten years, you can do something that may end up saving your life, right?  We will be happy to help you get it scheduled when you are ready.  Be sure to check your insurance coverage so you understand how much it will cost.  It may be covered as a preventative service at no cost, but you should check your particular policy.      Breast Self-Awareness Breast self-awareness means being familiar with how your breasts look and feel. It involves checking your breasts regularly and reporting any changes to your  health care provider. Practicing breast self-awareness is important. A change in your breasts can be a sign of a serious medical problem. Being familiar with how your breasts look and feel allows you to find any problems early, when treatment is more likely to be successful. All women should practice breast self-awareness, including women who have had breast implants. How to do a breast self-exam One way to learn what is normal for your breasts and whether your breasts are changing is to do a breast self-exam. To do a breast self-exam: Look for Changes  1. Remove all the clothing above your waist. 2. Stand in front of a mirror in a room with good lighting. 3. Put your hands on your hips. 4. Push your hands firmly downward. 5. Compare your breasts in the mirror. Look for differences between them (asymmetry), such as: ? Differences in shape. ? Differences in size. ? Puckers, dips, and bumps in one breast and not the other. 6. Look at each breast for changes in your skin, such as: ? Redness. ? Scaly areas. 7. Look for changes in your nipples, such as: ? Discharge. ? Bleeding. ? Dimpling. ? Redness. ? A change in position. Feel for Changes Carefully feel your breasts for lumps and changes. It is best to do this while lying on your back on the floor and again while sitting or standing in the shower or tub with soapy water on your skin. Feel each breast in the following way:  Place the arm on the side of the breast you are examining above your head.  Feel your breast with the other hand.  Start in the nipple area and make  inch (2 cm) overlapping circles to feel your breast. Use the pads of your three middle fingers to do this. Apply light pressure, then medium pressure, then firm pressure. The light pressure will allow you to feel the tissue closest to the skin. The medium pressure will allow you to feel the tissue that is a little deeper. The firm pressure will allow you to feel the tissue  close to the ribs.  Continue the overlapping circles, moving downward over the breast until you feel your ribs below your breast.  Move one finger-width toward the center of the body. Continue to use the  inch (2 cm) overlapping circles to feel your breast as you move slowly up toward your collarbone.  Continue the up and down exam using all three pressures until you reach your armpit.  Write Down What You Find  Write down what is normal for each breast and any changes that you find. Keep a written record with breast changes or normal findings for each breast. By writing this information down, you do not need to depend only on memory for size, tenderness, or location. Write down where you are in your menstrual cycle, if you are still menstruating. If you are having trouble noticing differences   in your breasts, do not get discouraged. With time you will become more familiar with the variations in your breasts and more comfortable with the exam. How often should I examine my breasts? Examine your breasts every month. If you are breastfeeding, the best time to examine your breasts is after a feeding or after using a breast pump. If you menstruate, the best time to examine your breasts is 5-7 days after your period is over. During your period, your breasts are lumpier, and it may be more difficult to notice changes. When should I see my health care provider? See your health care provider if you notice:  A change in shape or size of your breasts or nipples.  A change in the skin of your breast or nipples, such as a reddened or scaly area.  Unusual discharge from your nipples.  A lump or thick area that was not there before.  Pain in your breasts.  Anything that concerns you.  

## 2019-07-03 NOTE — Progress Notes (Signed)
54 y.o. [id number] Married White or Caucasian Not Hispanic or Latino female here for annual exam.  She was seen earlier this month with AUB. She had regular cycles through the spring of this year. She presented with a one month h/o bleeding, c/w an anovulatory bleed. Endometrial biopsy returned with degenerative secretory endometrium. Not anemic, normal TSH.  Ultrasound suggestive of adenomyosis, no other abnormalities.  She states that she is still bleeding. Not as heavy.    She has some hot flashes and night sweats, but tolerable. Sexually active, no pain.     Patient's last menstrual period was 05/21/2019.          Sexually active: Yes.    The current method of family planning is tubal ligation.    Exercising: Yes.    The patient does not participate in regular exercise at present. Smoker:  no  Health Maintenance: Pap:  10 years ago  History of abnormal Pap:  no MMG:  2018 normal  BMD:   no Colonoscopy: no TDaP:  12/12/18 Gardasil: none   reports that she has never smoked. She has never used smokeless tobacco. She reports that she does not drink alcohol and does not use drugs. Son is 85 in [place]. Daughter is 30, getting married. She is a Hotel manager at [employer]   Past Medical History:  Diagnosis Date  . Abnormal Pap smear of cervix    in her 20's  . Asthma   . Chronic kidney disease    kidney stones  . Pneumonia     Past Surgical History:  Procedure Laterality Date  . bladder stretching    . CHOLECYSTECTOMY    . LITHOTRIPSY    . OTHER SURGICAL HISTORY     peritoneal cyst removal  . TUBAL LIGATION      Current Outpatient Medications  Medication Sig Dispense Refill  . fexofenadine (ALLEGRA) 180 MG tablet Take 180 mg by mouth daily.    . fluticasone (FLONASE) 50 MCG/ACT nasal spray 1 spray each nostril following sinus rinses twice daily 16 g 11  . norethindrone (AYGESTIN) 5 MG tablet Take 1 tablet (5 mg total) by mouth 3 (three) times daily. 90 tablet 0  . Vitamin D,  Ergocalciferol, (DRISDOL) 1.25 MG (50000 UNIT) CAPS capsule TAKE 1 CAPSULE BY MOUTH 2 TIMES A WEEK. 12 capsule 0   No current facility-administered medications for this visit.    Family History  Problem Relation Age of Onset  . Hypertension Mother   . Diabetes Mother   . Cancer Mother        cervical  . Hypertension Father   . Cancer Maternal Aunt        breast  . Cancer Paternal Aunt        breast  . Heart attack Paternal Grandfather   . Stroke Paternal Grandfather   . Cancer Maternal Uncle        breast cancer  . Cervical cancer Cousin     Review of Systems  Exam:   BP 130/64   Pulse 85   Temp 97.9 F (36.6 C)   Ht 5\' 2"  (1.575 m)   Wt (!) 313 lb (142 kg)   LMP 05/21/2019   SpO2 99%   BMI 57.25 kg/m   Weight change: @WEIGHTCHANGE @ Height:   Height: 5\' 2"  (157.5 cm)  Ht Readings from Last 3 Encounters:  07/03/19 5\' 2"  (1.575 m)  06/27/19 5\' 2"  (1.575 m)  06/25/19 5' 0.75" (1.543 m)    General appearance:  alert, cooperative and appears stated age Head: Normocephalic, without obvious abnormality, atraumatic Neck: no adenopathy, supple, symmetrical, trachea midline and thyroid normal to inspection and palpation Lungs: clear to auscultation bilaterally Cardiovascular: regular rate and rhythm Breasts: normal appearance, no masses or tenderness Abdomen: soft, non-tender; non distended,  no masses,  no organomegaly Extremities: extremities normal, atraumatic, no cyanosis or edema Skin: Skin color, texture, turgor normal. No rashes or lesions Lymph nodes: Cervical, supraclavicular, and axillary nodes normal. No abnormal inguinal nodes palpated Neurologic: Grossly normal   Pelvic: External genitalia:  no lesions              Urethra:  normal appearing urethra with no masses, tenderness or lesions              Bartholins and Skenes: normal                 Vagina: normal appearing vagina with normal color and discharge, no lesions              Cervix: no lesions                Bimanual Exam:  Uterus:  normal size, contour, position, consistency, mobility, non-tender and anteverted              Adnexa: no mass, fullness, tenderness               Rectovaginal: deferred, recently done  Karmen Bongo chaperoned for the exam.  A:  Well Woman with normal exam  Perimenopausal  Recent abnormal bleed. Benign biopsy, ultrasound with suggested adenomyosis  P:   Pap with HPV  Mammogram due, # given  Labs with primary  Cologuard ordered (recommended that she check for coverage)  Discussed breast self exam  Discussed calcium and vit D intake  Have discussed cyclic provera, micronor and mirena IUD for cycle control  She would like to use provera. Will take it monthly starting on day #20 of her cycle  She will stop aygestin after 10 days.

## 2019-07-08 LAB — CYTOLOGY - PAP
Comment: NEGATIVE
Diagnosis: NEGATIVE
Diagnosis: REACTIVE
High risk HPV: NEGATIVE

## 2019-07-25 ENCOUNTER — Telehealth: Payer: Self-pay | Admitting: Physician Assistant

## 2019-07-25 NOTE — Telephone Encounter (Signed)
Patient left msg when office clsd for lunch--- requested Dorothea Ogle call her, no reason left.   --glh

## 2019-08-22 ENCOUNTER — Telehealth: Payer: Self-pay | Admitting: Physician Assistant

## 2019-08-22 ENCOUNTER — Other Ambulatory Visit: Payer: Self-pay | Admitting: Physician Assistant

## 2019-08-22 DIAGNOSIS — E559 Vitamin D deficiency, unspecified: Secondary | ICD-10-CM

## 2019-08-22 NOTE — Telephone Encounter (Signed)
Please contact patient to schedule apt with provider for further refills on Vit D. AS, CMA

## 2019-09-03 ENCOUNTER — Telehealth: Payer: Self-pay | Admitting: Physician Assistant

## 2019-09-03 DIAGNOSIS — J989 Respiratory disorder, unspecified: Secondary | ICD-10-CM

## 2019-09-03 DIAGNOSIS — R0989 Other specified symptoms and signs involving the circulatory and respiratory systems: Secondary | ICD-10-CM

## 2019-09-03 NOTE — Telephone Encounter (Signed)
Patient's spouse Myrle Sheng (DPR on file) is requesting a call back to discuss about getting a new "breathing machine" for his wife and the best way to go about it and any recommendations. Please contact him at [phone]

## 2019-09-03 NOTE — Addendum Note (Signed)
Addended by: Mickel Crow on: 09/03/2019 09:03 AM   Modules accepted: Orders

## 2019-09-03 NOTE — Telephone Encounter (Signed)
Abigail Cruz is agreeable to order for nebulizer machine. Order has been placed, printed and signed. Patient is aware script at front desk ready for pick up. AS< CMA

## 2019-09-03 NOTE — Telephone Encounter (Signed)
Called and spoke with patient to verify what device they were requesting. Patient is requesting a new nebulizer machine.

## 2019-09-09 ENCOUNTER — Other Ambulatory Visit: Payer: Self-pay | Admitting: Physician Assistant

## 2019-09-09 DIAGNOSIS — E559 Vitamin D deficiency, unspecified: Secondary | ICD-10-CM

## 2019-09-11 ENCOUNTER — Telehealth: Payer: Self-pay | Admitting: Physician Assistant

## 2019-09-11 ENCOUNTER — Other Ambulatory Visit: Payer: Self-pay | Admitting: Physician Assistant

## 2019-09-11 ENCOUNTER — Other Ambulatory Visit: Payer: Commercial Managed Care - PPO

## 2019-09-11 DIAGNOSIS — Z0000 Encounter for general adult medical examination without abnormal findings: Secondary | ICD-10-CM

## 2019-09-11 DIAGNOSIS — E559 Vitamin D deficiency, unspecified: Secondary | ICD-10-CM

## 2019-09-11 NOTE — Telephone Encounter (Signed)
Patient is aware she is overdue for OV for refills and she scheduled our first available but t hat is not until 10/17/19. She says she really needs a refill of her Vit D as she can tell a huge difference when she is not on it, and she is completely out. She is going out of town tomorrow and is hoping we can send a refill into [facility] today if possible.

## 2019-09-11 NOTE — Telephone Encounter (Signed)
Abigail Cruz is aware of the below. AS< CMA

## 2019-09-11 NOTE — Telephone Encounter (Signed)
Spoke with Herb Grays and she states unable to give refill until patient has vit d lab done. Last vit d level checked 11/2017. Patient last seen for chronic issues 11/2018.   Patient has been made aware of the above and is very upset. She became very agitated on the phone and stated that we are not giving good care and that she wants to transfer care because we wont give her Vit d refills. I advised the patient the importance of having the lab work checked annually and she states this is our fault she has not had this done.   Patient is coming in today at 330pm to have blood work drawn. AS, CMA

## 2019-09-12 ENCOUNTER — Other Ambulatory Visit: Payer: Commercial Managed Care - PPO

## 2019-09-12 ENCOUNTER — Other Ambulatory Visit: Payer: Self-pay

## 2019-09-12 DIAGNOSIS — E559 Vitamin D deficiency, unspecified: Secondary | ICD-10-CM

## 2019-09-12 DIAGNOSIS — Z0000 Encounter for general adult medical examination without abnormal findings: Secondary | ICD-10-CM

## 2019-09-12 NOTE — Addendum Note (Signed)
Addended by: Mickel Crow on: 09/12/2019 07:59 AM   Modules accepted: Orders

## 2019-09-13 ENCOUNTER — Other Ambulatory Visit: Payer: Self-pay | Admitting: Physician Assistant

## 2019-09-13 DIAGNOSIS — E559 Vitamin D deficiency, unspecified: Secondary | ICD-10-CM

## 2019-09-13 LAB — COMPREHENSIVE METABOLIC PANEL
ALT: 18 IU/L (ref 0–32)
AST: 18 IU/L (ref 0–40)
Albumin/Globulin Ratio: 1.6 (ref 1.2–2.2)
Albumin: 3.9 g/dL (ref 3.8–4.9)
Alkaline Phosphatase: 97 IU/L (ref 48–121)
BUN/Creatinine Ratio: 21 (ref 9–23)
BUN: 13 mg/dL (ref 6–24)
Bilirubin Total: 0.2 mg/dL (ref 0.0–1.2)
CO2: 25 mmol/L (ref 20–29)
Calcium: 8.9 mg/dL (ref 8.7–10.2)
Chloride: 102 mmol/L (ref 96–106)
Creatinine, Ser: 0.61 mg/dL (ref 0.57–1.00)
GFR calc Af Amer: 121 mL/min/{1.73_m2} (ref >59)
GFR calc non Af Amer: 105 mL/min/{1.73_m2} (ref >59)
Globulin, Total: 2.4 g/dL (ref 1.5–4.5)
Glucose: 94 mg/dL (ref 65–99)
Potassium: 4.5 mmol/L (ref 3.5–5.2)
Sodium: 140 mmol/L (ref 134–144)
Total Protein: 6.3 g/dL (ref 6.0–8.5)

## 2019-09-13 LAB — LIPID PANEL
Chol/HDL Ratio: 3.5 ratio (ref 0.0–4.4)
Cholesterol, Total: 146 mg/dL (ref 100–199)
HDL: 42 mg/dL
LDL Chol Calc (NIH): 78 mg/dL (ref 0–99)
Triglycerides: 150 mg/dL — ABNORMAL HIGH (ref 0–149)
VLDL Cholesterol Cal: 26 mg/dL (ref 5–40)

## 2019-09-13 LAB — HEMOGLOBIN A1C
Est. average glucose Bld gHb Est-mCnc: 105 mg/dL
Hgb A1c MFr Bld: 5.3 % (ref 4.8–5.6)

## 2019-09-13 LAB — VITAMIN D 25 HYDROXY (VIT D DEFICIENCY, FRACTURES): Vit D, 25-Hydroxy: 48.5 ng/mL (ref 30.0–100.0)

## 2019-09-13 MED ORDER — VITAMIN D (ERGOCALCIFEROL) 1.25 MG (50000 UNIT) PO CAPS: ORAL_CAPSULE | ORAL | 1 refills | Status: DC

## 2019-09-14 IMAGING — CR DG HIP (WITH OR WITHOUT PELVIS) 2-3V*L*
3 series · 3 of 3 positions shown · non-contrast
Comparison: None.

CLINICAL DATA: Left hip pain for 4 days.  No known injury.

EXAM:
DG HIP (WITH OR WITHOUT PELVIS) 2-3V LEFT

[pelvis ap]
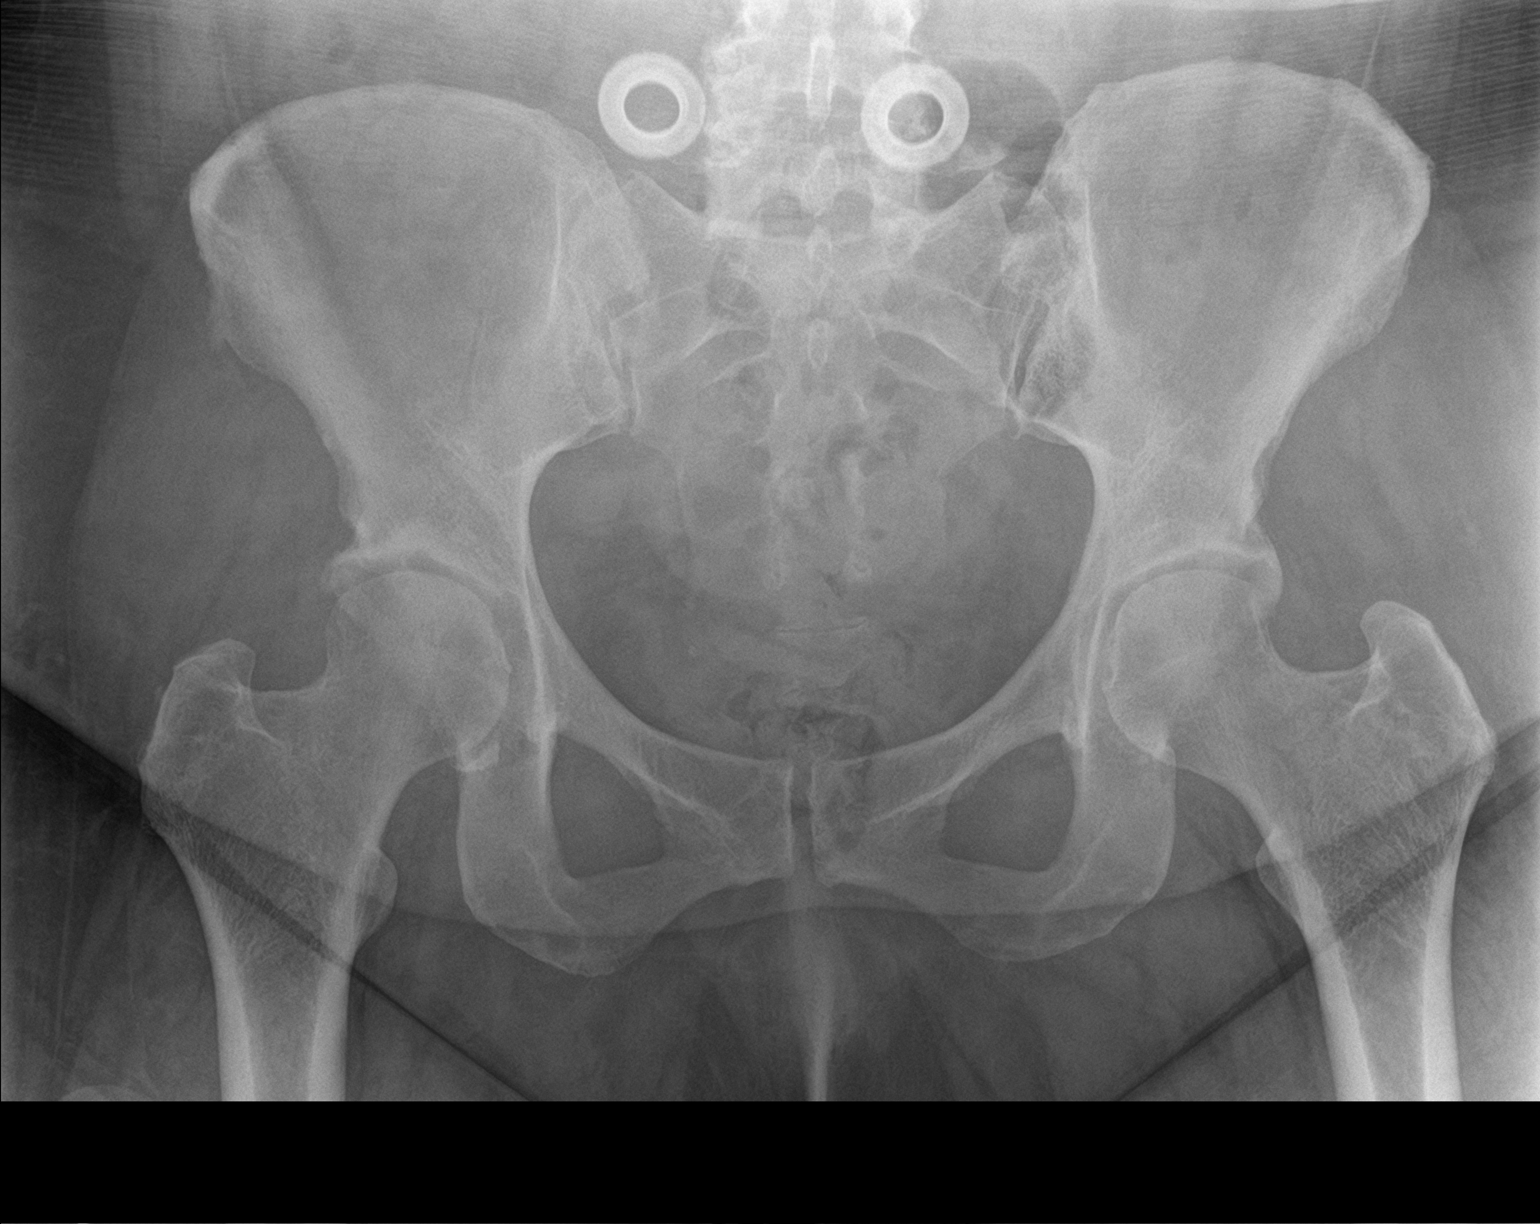

[hip ap]
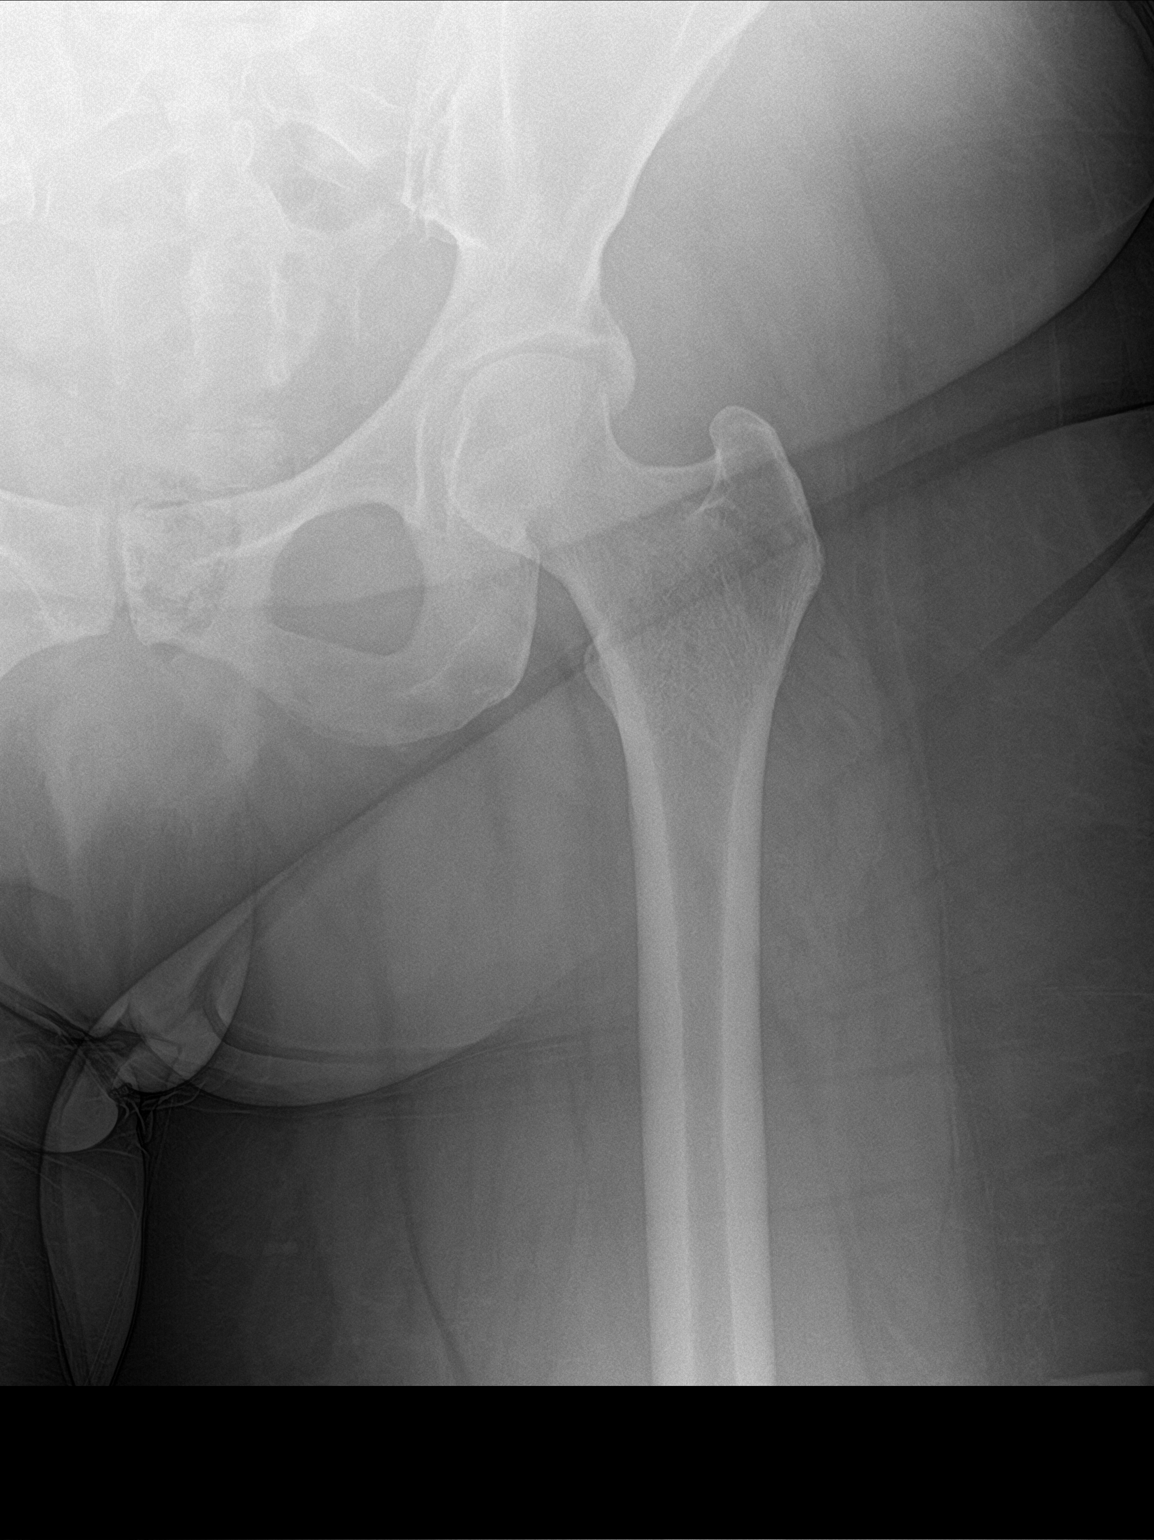

[hip lat]
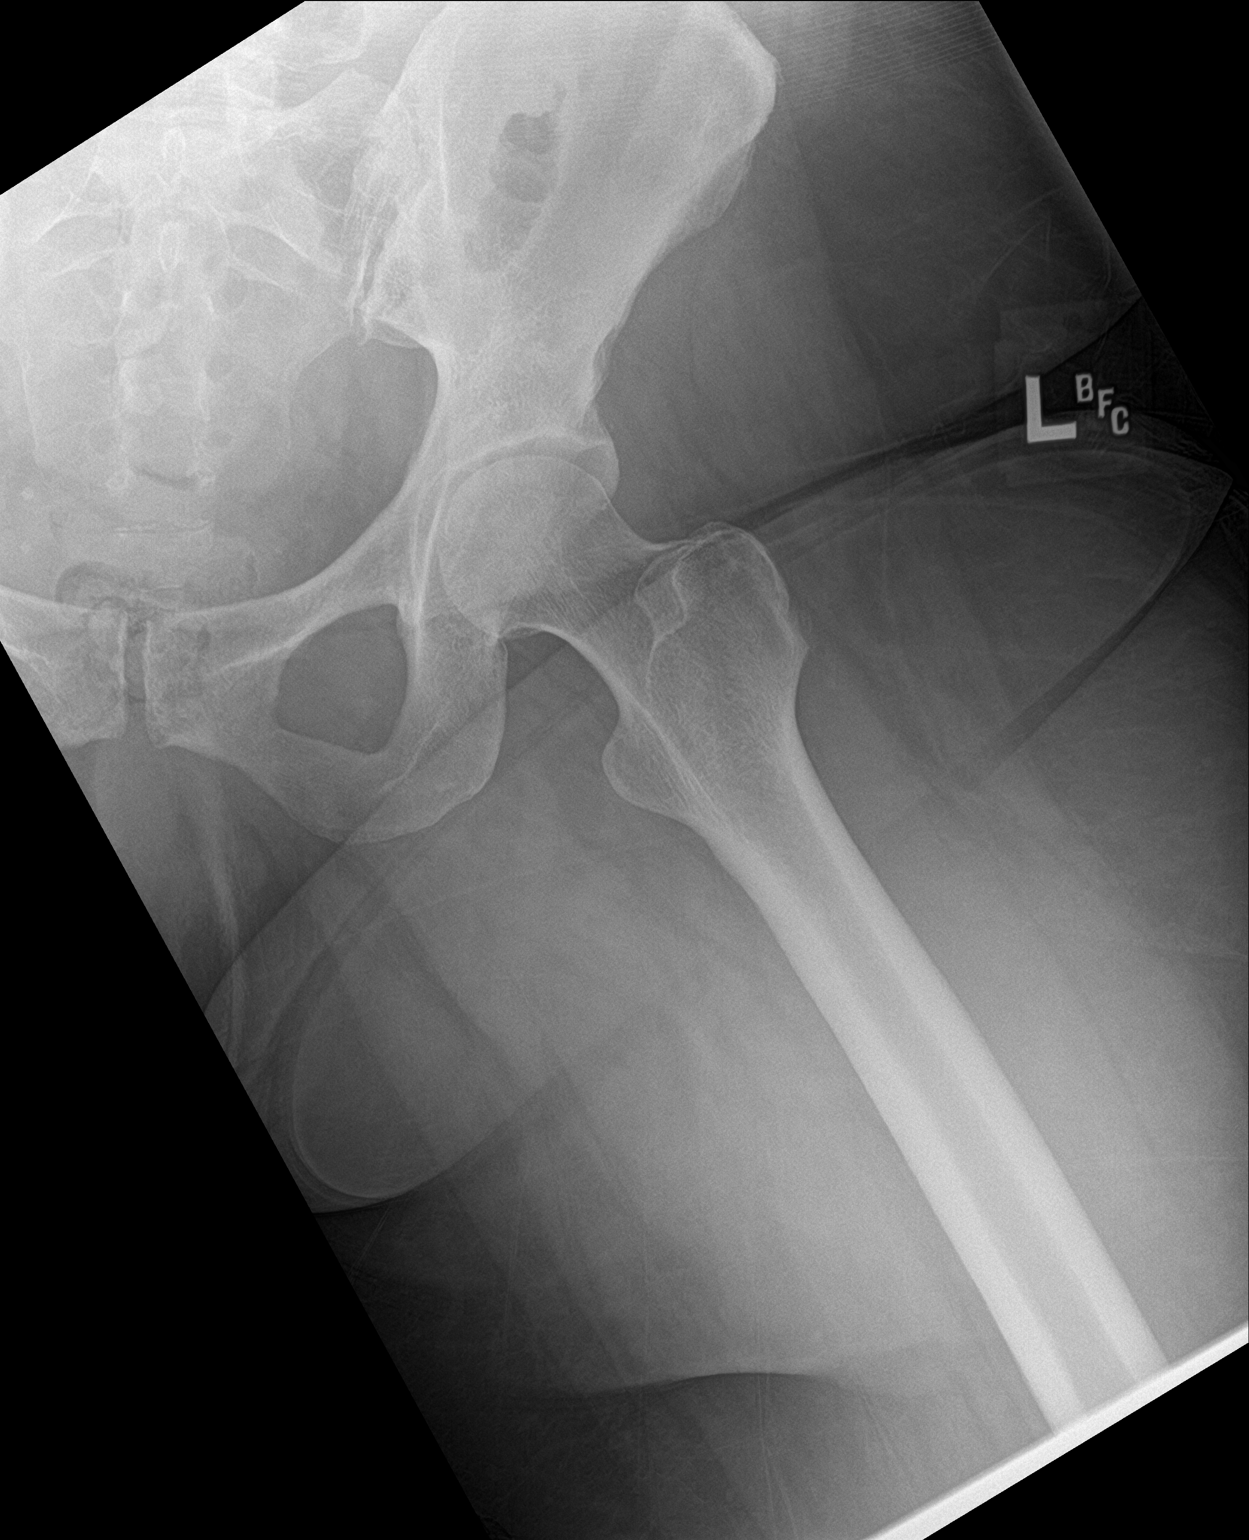

[3 of 3 positions shown; findings below may reference images not displayed]

FINDINGS: There is no evidence of hip fracture or dislocation. There is no
evidence of arthropathy or other focal bone abnormality.
IMPRESSION: Negative.

## 2019-09-14 IMAGING — MR MR LUMBAR SPINE W/O CM
4 of 5 series · 26 of 48 positions shown · non-contrast
Comparison: None.

ADDENDUM:
After additional review and clinical correlation with Dr. Tiger
Metiga, note is made of an extraforaminal protrusion at L2-3 on the
LEFT. See series 9, image 16. LEFT L2 nerve root
irritation/displacement is likely.
CLINICAL DATA: Low back pain. LEFT leg pain. Onset several days
ago. No urinary discomfort.

EXAM:
MRI LUMBAR SPINE WITHOUT CONTRAST
TECHNIQUE: Multiplanar, multisequence MR imaging of the lumbar spine was
performed. No intravenous contrast was administered.

[Series 5: T2 · sagittal · 4.0mm · 0.73mm/px · 6 of 16 slices shown (1 of 2)]
[im 1/16]
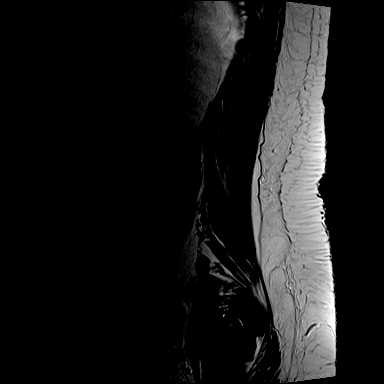
[im 4/16]
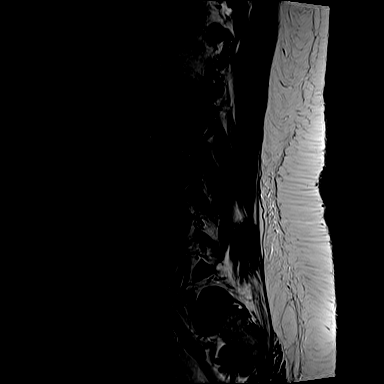
[im 7/16]
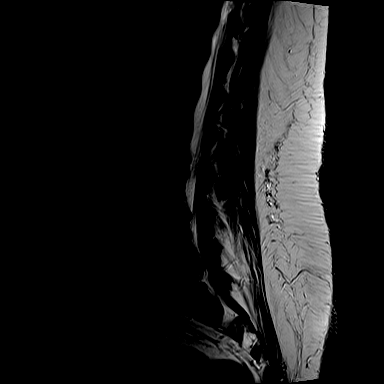
[im 10/16]
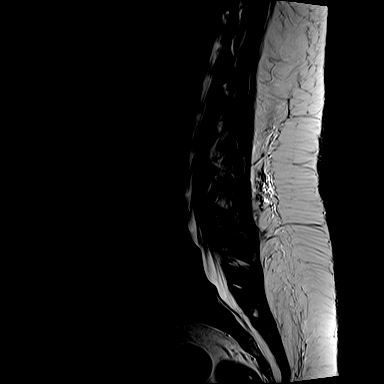
[im 13/16]
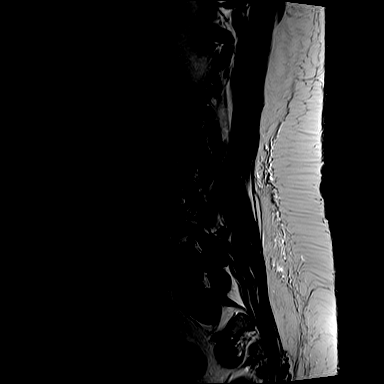
[im 16/16]
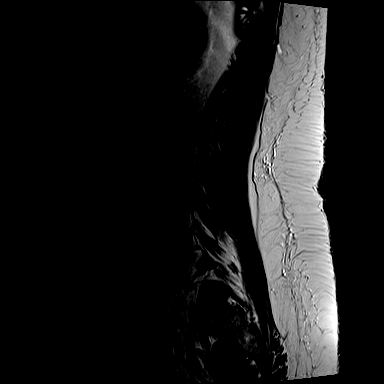

[Series 7: T1 · sagittal · 4.0mm · 0.88mm/px · 7 of 16 slices shown (1 of 2)]
[im 1/16]
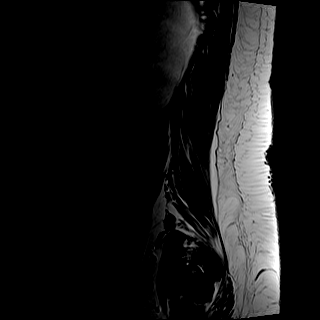
[im 3/16]
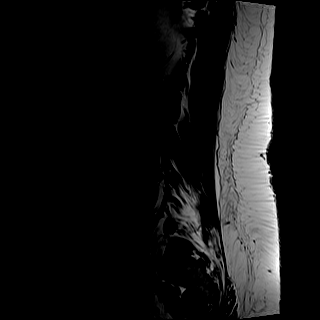
[im 6/16]
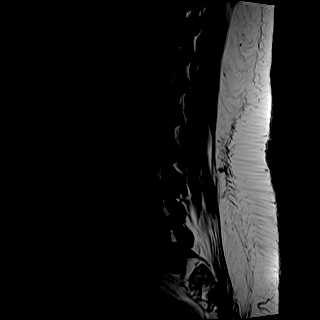
[im 8/16]
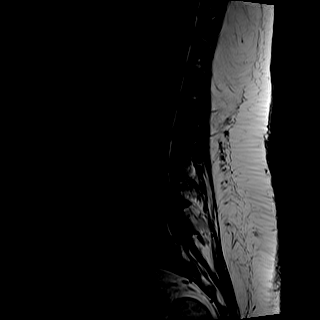
[im 11/16]
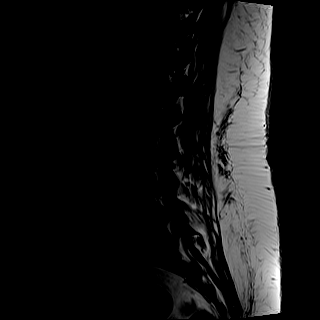
[im 13/16]
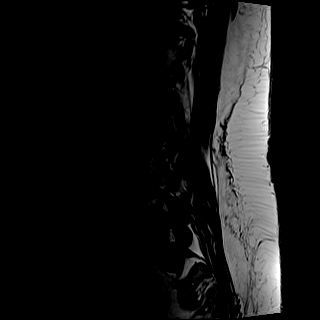
[im 16/16]
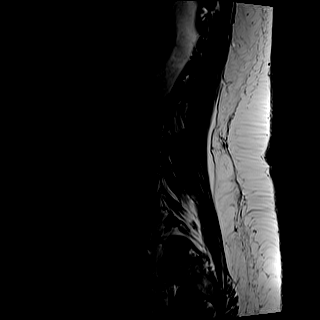

[Series 8: T2 · axial · 4.0mm · 0.57mm/px · z∈[-27,+164]mm · 8 of 32 slices shown (2 of 2)]
[im 1/32]
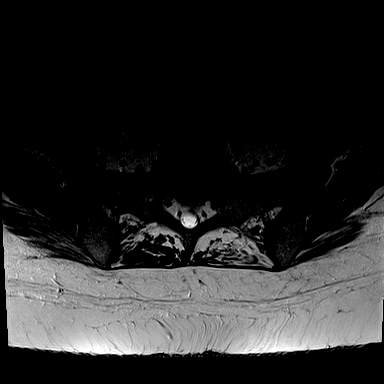
[im 5/32]
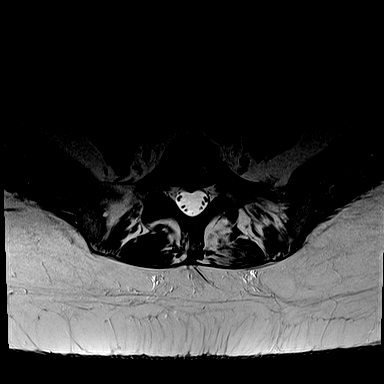
[im 10/32]
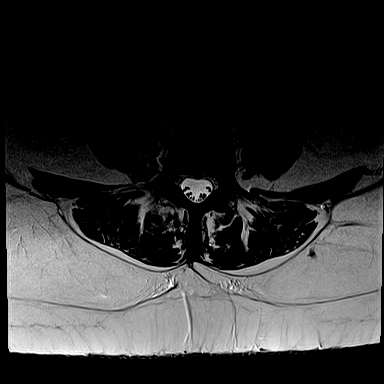
[im 15/32]
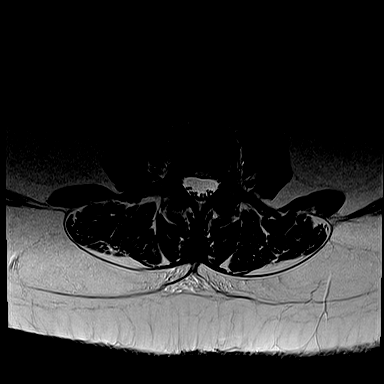
[im 17/32]
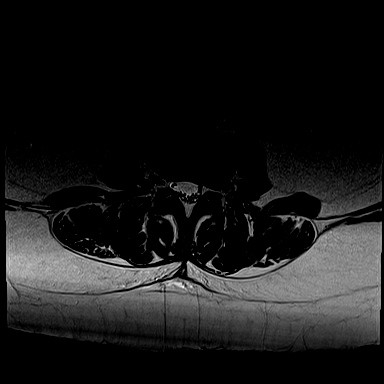
[im 22/32]
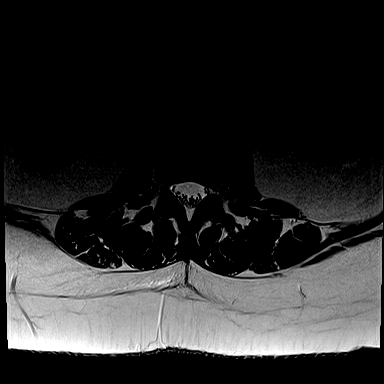
[im 27/32]
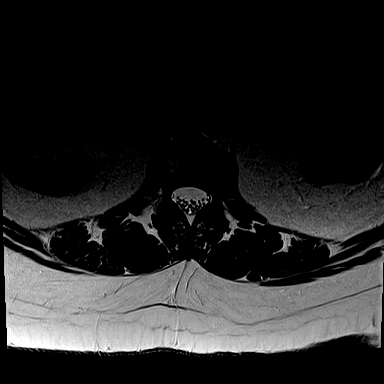
[im 32/32]
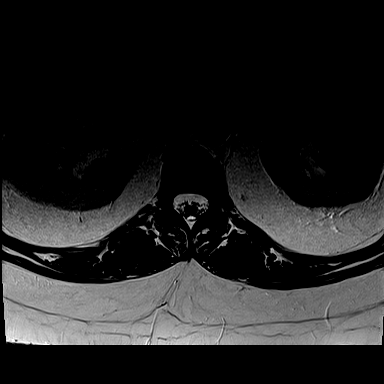

[Series 9: T1 · axial · 4.0mm · 0.34mm/px · z∈[-27,+140]mm · 5 of 32 slices shown (2 of 2)]
[im 1/32]
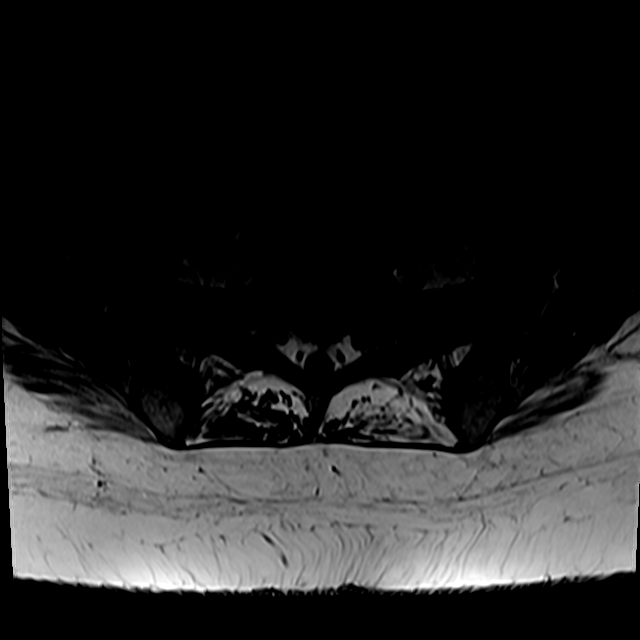
[im 5/32]
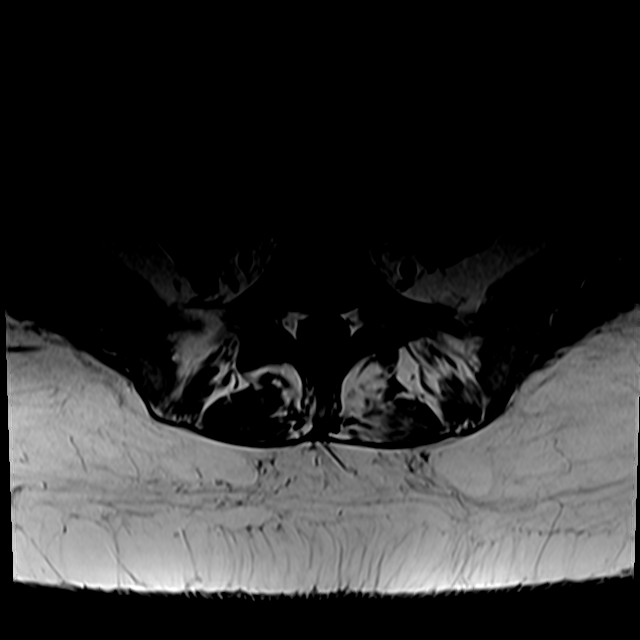
[im 10/32]
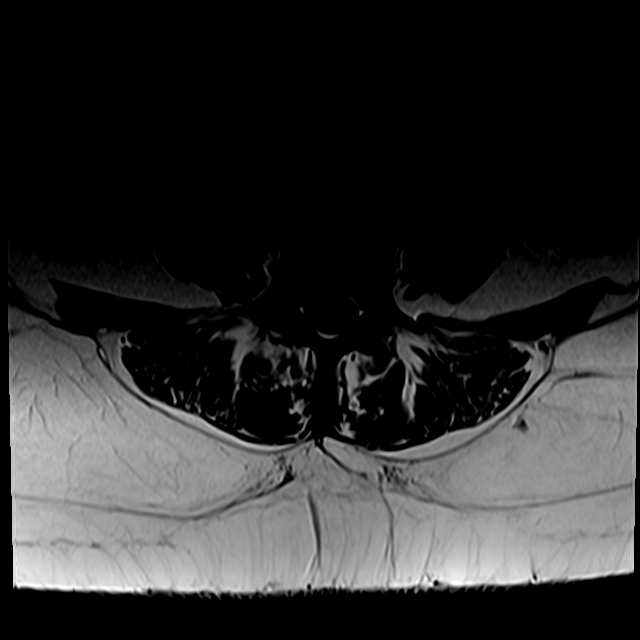
[im 17/32]
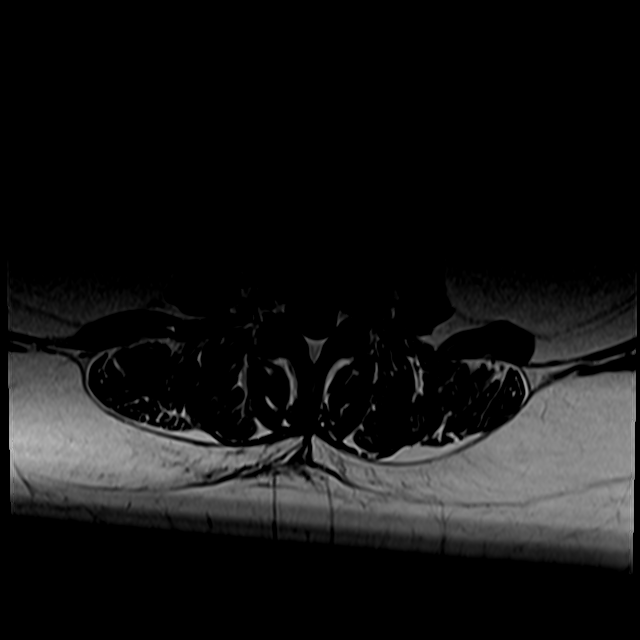
[im 27/32]
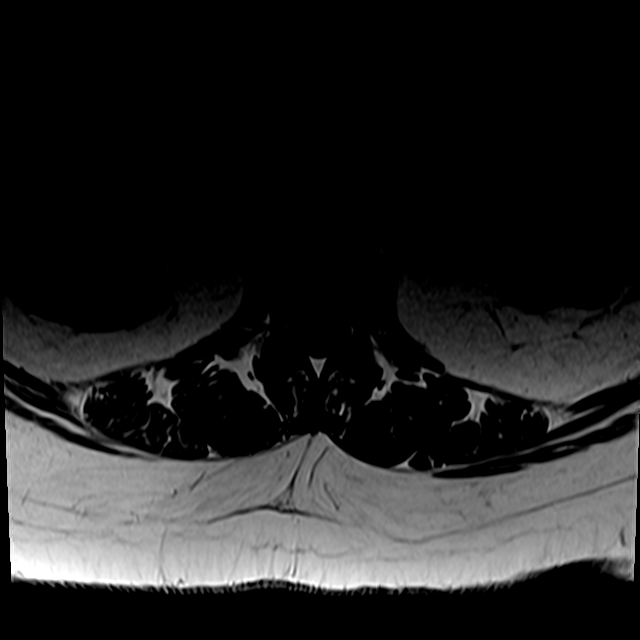

[26 of 48 positions shown; findings below may reference images not displayed]

FINDINGS: Segmentation:  Standard.

Alignment:  Slight anterolisthesis L4-5, 1-2 mm.

Vertebrae:  No worrisome osseous lesion.

Conus medullaris and cauda equina: Conus extends to the L1 level.
Conus and cauda equina appear normal.

Paraspinal and other soft tissues: Increased body habitus without
features of epidural lipomatosis.

Disc levels:

L1-L2:  Normal.

L2-L3:  Disc desiccation.  Facet arthropathy.  No protrusion.

L3-L4: Disc space narrowing. Facet arthropathy and ligamentum flavum
hypertrophy. Far-lateral and foraminal annular rent with protrusion
on the LEFT. LEFT L4, possibly LEFT L3 neural
impingement/irritation.

L4-L5: 1-2 mm anterolisthesis. Disc space narrowing. Posterior
element hypertrophy. Annular bulge. No protrusion. No impingement.

L5-S1: Disc space narrowing. Facet arthropathy. Central
protrusion/extrusion, caudally migrated fragment. This does not
result in stenosis or contact either S1 nerve root, but is mildly
eccentric to the LEFT.
IMPRESSION: The dominant LEFT-sided abnormality appears to be at L3-4, where a
far-lateral and foraminal annular rent with protrusion affects the
LEFT L4, possibly LEFT L3 nerve roots.

1-2 mm facet mediated anterolisthesis L4-5, without impingement.

Central protrusion/extrusion L5-S1 with caudally migrated fragment
which does not result in stenosis or S1 nerve root impingement.
Significance uncertain.

## 2019-10-17 ENCOUNTER — Ambulatory Visit: Payer: Commercial Managed Care - PPO | Admitting: Physician Assistant

## 2019-10-22 ENCOUNTER — Telehealth: Payer: Self-pay

## 2019-10-22 NOTE — Telephone Encounter (Signed)
Patient is calling in regards to discuss different options than having a colonoscopy.

## 2019-10-22 NOTE — Telephone Encounter (Signed)
Spoke with patient. Patient was seen in office for AEX 07/03/19, discussed alternative to a  Colonoscopy, she is unsure what that was. Reviewed OV notes, Cologuard discussed. Patient will check with her insurance plan to determine coverage, she will return call to office to advise if she wants to proceed.  Number provided to [facility] for scheduling screening [facility]. Questions answered.   Encounter closed.

## 2020-03-04 ENCOUNTER — Encounter: Payer: Self-pay | Admitting: Physician Assistant

## 2020-03-18 ENCOUNTER — Other Ambulatory Visit: Payer: Self-pay | Admitting: Physician Assistant

## 2020-03-18 DIAGNOSIS — E559 Vitamin D deficiency, unspecified: Secondary | ICD-10-CM

## 2020-03-23 ENCOUNTER — Other Ambulatory Visit: Payer: Self-pay | Admitting: Physician Assistant

## 2020-03-23 DIAGNOSIS — E559 Vitamin D deficiency, unspecified: Secondary | ICD-10-CM

## 2020-03-24 ENCOUNTER — Telehealth: Payer: Self-pay | Admitting: Physician Assistant

## 2020-03-24 NOTE — Telephone Encounter (Signed)
Patient would like to know why her Vitamin D medication was denied and not refilled. Patient states pharmacy called [place]. She uses [facility], thanks.

## 2020-03-24 NOTE — Telephone Encounter (Signed)
Patient stating that she needs refill of Vit D. I advised patient per [place]-no further medication refills will be given until patient is seen in office by provider since we made an exception in August, 2021 and advised patient then to follow up for further refills and patient was non-compliant. Pt states that she wants to be seen today for this refill and a CPE. I advised patient Helyn App is unable to see pt today. She states "I was never told to schedule a follow up apt in August" and I advised patient she was, I was the [place] that she spoke with on the phone then and was the one whom advised her of this. Advised patient it was also documented in her chart. Pt is very argumentative and aggressive on the call. Demanding rx refill. Herb Grays was standing at my desk while I was on the phone with the patient and confirmed she would not give any medication refills to patient without a apt. Pt very upset and states she is going to transfer care.

## 2020-03-31 ENCOUNTER — Other Ambulatory Visit: Payer: Self-pay

## 2020-03-31 ENCOUNTER — Ambulatory Visit (INDEPENDENT_AMBULATORY_CARE_PROVIDER_SITE_OTHER): Payer: Commercial Managed Care - PPO | Admitting: Physician Assistant

## 2020-03-31 ENCOUNTER — Other Ambulatory Visit: Payer: Self-pay | Admitting: Physician Assistant

## 2020-03-31 VITALS — BP 161/84 | HR 84 | Temp 98.9°F | Ht 62.0 in | Wt 327.2 lb

## 2020-03-31 DIAGNOSIS — Z0000 Encounter for general adult medical examination without abnormal findings: Secondary | ICD-10-CM | POA: Diagnosis not present

## 2020-03-31 DIAGNOSIS — E559 Vitamin D deficiency, unspecified: Secondary | ICD-10-CM

## 2020-03-31 NOTE — Progress Notes (Signed)
Subjective:     Abigail Cruz is a 55 y.o. female and is here for a comprehensive physical exam. The patient reports no problems.  Social History   Socioeconomic History  . Marital status: Married    Spouse name: Not on file  . Number of children: Not on file  . Years of education: Not on file  . Highest education level: Not on file  Occupational History  . Not on file  Tobacco Use  . Smoking status: Never Smoker  . Smokeless tobacco: Never Used  Vaping Use  . Vaping Use: Never used  Substance and Sexual Activity  . Alcohol use: No  . Drug use: No  . Sexual activity: Yes    Partners: Male    Birth control/protection: Surgical    Comment: btl  Other Topics Concern  . Not on file  Social History Narrative  . Not on file   Social Determinants of Health   Financial Resource Strain: Not on file  Food Insecurity: Not on file  Transportation Needs: Not on file  Physical Activity: Not on file  Stress: Not on file  Social Connections: Not on file  Intimate Partner Violence: Not on file   Health Maintenance  Topic Date Due  . Hepatitis C Screening  Never done  . COVID-19 Vaccine (1) Never done  . HIV Screening  Never done  . COLONOSCOPY (Pts 45-63yrs Insurance coverage will need to be confirmed)  Never done  . MAMMOGRAM  07/04/2017  . INFLUENZA VACCINE  Never done  . PAP SMEAR-Modifier  07/03/2022  . TETANUS/TDAP  12/11/2028  . HPV VACCINES  Aged Out    The following portions of the patient's history were reviewed and updated as appropriate: allergies, current medications, past family history, past medical history, past social history, past surgical history and problem list.  Review of Systems Pertinent items noted in HPI and remainder of comprehensive ROS otherwise negative.   Objective:    BP (!) 151/80   Pulse 84   Temp 98.9 F (37.2 C)   Ht 5\' 2"  (1.575 m)   Wt (!) 327 lb 3.2 oz (148.4 kg)   SpO2 96%   BMI 59.85 kg/m  General appearance: alert,  cooperative and no distress Head: Normocephalic, without obvious abnormality, atraumatic Eyes: conjunctivae/corneas clear. PERRL, EOM's intact. Fundi benign. Ears: normal TM's and external ear canals both ears Nose: Nares normal. Septum midline. Mucosa normal. No drainage or sinus tenderness. Throat: lips, mucosa, and tongue normal; teeth and gums normal Neck: no adenopathy, no JVD, supple, symmetrical, trachea midline and thyroid not enlarged, symmetric, no tenderness/mass/nodules Back: symmetric, no curvature. ROM normal. No CVA tenderness. Lungs: clear to auscultation bilaterally Breasts: Deferred to OB-GYN Heart: regular rate and rhythm, S1, S2 normal, no murmur, click, rub or gallop Abdomen: normal findings: bowel sounds normal, no masses palpable and no organomegaly and abnormal findings:  obese Pelvic: deferred Extremities: extremities normal, atraumatic, no cyanosis or edema Pulses: 2+ and symmetric Skin: Skin color, texture, turgor normal. No rashes or lesions Lymph nodes: Cervical adenopathy: normal and Supraclavicular adenopathy: normal Neurologic: Grossly normal    Assessment:    Healthy female exam.    Plan:  -Will obtain labs today, patient is non-fasting so will collect direct LDL. -Discussed with patient recommend checking vitamin D every 6-12 months to monitor for over supplementation  and should follow up annually for general physical. Continue to follow up with OB-GYN for well woman exam. -Patient plans to schedule mammogram and OB-GYN is  placing order for cologuard.  -UTD on pap smear and Tdap. -Encourage to continue with weight loss efforts. Will compete bariatric forms and fax to [facility]. -Follow up in 1 year for CPE and FBW.    See After Visit Summary for Counseling Recommendations

## 2020-03-31 NOTE — Patient Instructions (Signed)
Preventive Care 53-53 Years Old, Female Preventive care refers to lifestyle choices and visits with your health care provider that can promote health and wellness. This includes:  A yearly physical exam. This is also called an annual wellness visit.  Regular dental and eye exams.  Immunizations.  Screening for certain conditions.  Healthy lifestyle choices, such as: ? Eating a healthy diet. ? Getting regular exercise. ? Not using drugs or products that contain nicotine and tobacco. ? Limiting alcohol use. What can I expect for my preventive care visit? Physical exam Your health care provider will check your:  Height and weight. These may be used to calculate your BMI (body mass index). BMI is a measurement that tells if you are at a healthy weight.  Heart rate and blood pressure.  Body temperature.  Skin for abnormal spots. Counseling Your health care provider may ask you questions about your:  Past medical problems.  Family's medical history.  Alcohol, tobacco, and drug use.  Emotional well-being.  Home life and relationship well-being.  Sexual activity.  Diet, exercise, and sleep habits.  Work and work Statistician.  Access to firearms.  Method of birth control.  Menstrual cycle.  Pregnancy history. What immunizations do I need? Vaccines are usually given at various ages, according to a schedule. Your health care provider will recommend vaccines for you based on your age, medical history, and lifestyle or other factors, such as travel or where you work.   What tests do I need? Blood tests  Lipid and cholesterol levels. These may be checked every 5 years, or more often if you are over 53 years old.  Hepatitis C test.  Hepatitis B test. Screening  Lung cancer screening. You may have this screening every year starting at age 53 if you have a 30-pack-year history of smoking and currently smoke or have quit within the past 15 years.  Colorectal cancer  screening. ? All adults should have this screening starting at age 53 and continuing until age 17. ? Your health care provider may recommend screening at age 53 if you are at increased risk. ? You will have tests every 1-10 years, depending on your results and the type of screening test.  Diabetes screening. ? This is done by checking your blood sugar (glucose) after you have not eaten for a while (fasting). ? You may have this done every 1-3 years.  Mammogram. ? This may be done every 1-2 years. ? Talk with your health care provider about when you should start having regular mammograms. This may depend on whether you have a family history of breast cancer.  BRCA-related cancer screening. This may be done if you have a family history of breast, ovarian, tubal, or peritoneal cancers.  Pelvic exam and Pap test. ? This may be done every 3 years starting at age 53. ? Starting at age 11, this may be done every 5 years if you have a Pap test in combination with an HPV test. Other tests  STD (sexually transmitted disease) testing, if you are at risk.  Bone density scan. This is done to screen for osteoporosis. You may have this scan if you are at high risk for osteoporosis. Talk with your health care provider about your test results, treatment options, and if necessary, the need for more tests. Follow these instructions at home: Eating and drinking  Eat a diet that includes fresh fruits and vegetables, whole grains, lean protein, and low-fat dairy products.  Take vitamin and mineral supplements  as recommended by your health care provider.  Do not drink alcohol if: ? Your health care provider tells you not to drink. ? You are pregnant, may be pregnant, or are planning to become pregnant.  If you drink alcohol: ? Limit how much you have to 0-1 drink a day. ? Be aware of how much alcohol is in your drink. In the [place] one drink equals one 12 oz bottle of beer (355 mL), one 5 oz glass of  wine (148 mL), or one 1 oz glass of hard liquor (44 mL).   Lifestyle  Take daily care of your teeth and gums. Brush your teeth every morning and night with fluoride toothpaste. Floss one time each day.  Stay active. Exercise for at least 30 minutes 5 or more days each week.  Do not use any products that contain nicotine or tobacco, such as cigarettes, e-cigarettes, and chewing tobacco. If you need help quitting, ask your health care provider.  Do not use drugs.  If you are sexually active, practice safe sex. Use a condom or other form of protection to prevent STIs (sexually transmitted infections).  If you do not wish to become pregnant, use a form of birth control. If you plan to become pregnant, see your health care provider for a prepregnancy visit.  If told by your health care provider, take low-dose aspirin daily starting at age 50.  Find healthy ways to cope with stress, such as: ? Meditation, yoga, or listening to music. ? Journaling. ? Talking to a trusted person. ? Spending time with friends and family. Safety  Always wear your seat belt while driving or riding in a vehicle.  Do not drive: ? If you have been drinking alcohol. Do not ride with someone who has been drinking. ? When you are tired or distracted. ? While texting.  Wear a helmet and other protective equipment during sports activities.  If you have firearms in your house, make sure you follow all gun safety procedures. What's next?  Visit your health care provider once a year for an annual wellness visit.  Ask your health care provider how often you should have your eyes and teeth checked.  Stay up to date on all vaccines. This information is not intended to replace advice given to you by your health care provider. Make sure you discuss any questions you have with your health care provider. Document Revised: 10/08/2019 Document Reviewed: 09/14/2017 Elsevier Patient Education  2021 Elsevier Inc.  

## 2020-04-01 ENCOUNTER — Other Ambulatory Visit: Payer: Self-pay | Admitting: Physician Assistant

## 2020-04-01 DIAGNOSIS — E559 Vitamin D deficiency, unspecified: Secondary | ICD-10-CM

## 2020-04-01 LAB — CBC WITH DIFFERENTIAL/PLATELET
Basophils Absolute: 0.1 10*3/uL (ref 0.0–0.2)
Basos: 1 %
EOS (ABSOLUTE): 0.3 10*3/uL (ref 0.0–0.4)
Eos: 4 %
Hematocrit: 40.6 % (ref 34.0–46.6)
Hemoglobin: 13.3 g/dL (ref 11.1–15.9)
Immature Grans (Abs): 0 10*3/uL (ref 0.0–0.1)
Immature Granulocytes: 0 %
Lymphocytes Absolute: 2 10*3/uL (ref 0.7–3.1)
Lymphs: 22 %
MCH: 27 pg (ref 26.6–33.0)
MCHC: 32.8 g/dL (ref 31.5–35.7)
MCV: 83 fL (ref 79–97)
Monocytes Absolute: 0.7 10*3/uL (ref 0.1–0.9)
Monocytes: 8 %
Neutrophils Absolute: 6.1 10*3/uL (ref 1.4–7.0)
Neutrophils: 65 %
Platelets: 297 10*3/uL (ref 150–450)
RBC: 4.92 x10E6/uL (ref 3.77–5.28)
RDW: 15.6 % — ABNORMAL HIGH (ref 11.7–15.4)
WBC: 9.2 10*3/uL (ref 3.4–10.8)

## 2020-04-01 LAB — COMPREHENSIVE METABOLIC PANEL
ALT: 18 IU/L (ref 0–32)
AST: 16 IU/L (ref 0–40)
Albumin/Globulin Ratio: 1.7 (ref 1.2–2.2)
Albumin: 4 g/dL (ref 3.8–4.9)
Alkaline Phosphatase: 83 IU/L (ref 44–121)
BUN/Creatinine Ratio: 20 (ref 9–23)
BUN: 12 mg/dL (ref 6–24)
Bilirubin Total: 0.3 mg/dL (ref 0.0–1.2)
CO2: 22 mmol/L (ref 20–29)
Calcium: 9 mg/dL (ref 8.7–10.2)
Chloride: 100 mmol/L (ref 96–106)
Creatinine, Ser: 0.61 mg/dL (ref 0.57–1.00)
Globulin, Total: 2.3 g/dL (ref 1.5–4.5)
Glucose: 97 mg/dL (ref 65–99)
Potassium: 4.3 mmol/L (ref 3.5–5.2)
Sodium: 135 mmol/L (ref 134–144)
Total Protein: 6.3 g/dL (ref 6.0–8.5)
eGFR: 107 mL/min/{1.73_m2} (ref >59)

## 2020-04-01 LAB — VITAMIN D 25 HYDROXY (VIT D DEFICIENCY, FRACTURES): Vit D, 25-Hydroxy: 54.1 ng/mL (ref 30.0–100.0)

## 2020-04-01 LAB — HEMOGLOBIN A1C
Est. average glucose Bld gHb Est-mCnc: 105 mg/dL
Hgb A1c MFr Bld: 5.3 % (ref 4.8–5.6)

## 2020-04-01 LAB — LDL CHOLESTEROL, DIRECT: LDL Direct: 97 mg/dL (ref 0–99)

## 2020-04-01 LAB — TSH: TSH: 2.29 u[IU]/mL (ref 0.450–4.500)

## 2020-04-01 MED ORDER — VITAMIN D (ERGOCALCIFEROL) 1.25 MG (50000 UNIT) PO CAPS: ORAL_CAPSULE | ORAL | 1 refills | Status: DC

## 2020-04-07 ENCOUNTER — Encounter: Payer: Self-pay | Admitting: Physician Assistant

## 2020-05-04 ENCOUNTER — Encounter: Payer: Self-pay | Admitting: Physician Assistant

## 2020-05-04 DIAGNOSIS — I10 Essential (primary) hypertension: Secondary | ICD-10-CM

## 2020-05-04 MED ORDER — VALSARTAN-HYDROCHLOROTHIAZIDE 80-12.5 MG PO TABS: 1.0000 | ORAL_TABLET | Freq: Every day | ORAL | 0 refills | Status: DC

## 2020-05-14 ENCOUNTER — Telehealth: Payer: Self-pay | Admitting: Physician Assistant

## 2020-05-14 DIAGNOSIS — Z1231 Encounter for screening mammogram for malignant neoplasm of breast: Secondary | ICD-10-CM

## 2020-05-14 NOTE — Telephone Encounter (Signed)
MM ordered for preventative health screen. AS, CMA

## 2020-06-18 ENCOUNTER — Other Ambulatory Visit: Payer: Self-pay

## 2020-06-18 ENCOUNTER — Encounter: Payer: Self-pay | Admitting: Physician Assistant

## 2020-06-18 ENCOUNTER — Ambulatory Visit (INDEPENDENT_AMBULATORY_CARE_PROVIDER_SITE_OTHER): Payer: Commercial Managed Care - PPO | Admitting: Physician Assistant

## 2020-06-18 VITALS — BP 131/70 | HR 81 | Temp 97.5°F | Ht 62.0 in | Wt 330.8 lb

## 2020-06-18 DIAGNOSIS — I10 Essential (primary) hypertension: Secondary | ICD-10-CM

## 2020-06-18 DIAGNOSIS — Z711 Person with feared health complaint in whom no diagnosis is made: Secondary | ICD-10-CM

## 2020-06-18 NOTE — Patient Instructions (Signed)

## 2020-06-18 NOTE — Progress Notes (Signed)
Established Patient Office Visit  Subjective:  Patient ID: Abigail Cruz, female    DOB: 06-24-1965  Age: 55 y.o. MRN: [id number]  CC:  Chief Complaint  Patient presents with  . Hypertension  . Depression    HPI [place] presents for follow up on hypertension and mood evaluation. Patient is pursuing bariatric surgery. Denies prior hx of psychiatric conditions, denies feeling anxious or depressive symptomology, SI or HI. Performing her job functions without issues.  HTN: Pt denies chest pain, palpitations, dizziness or headache. Lower extremity edema is stable. States has been more active with house projects and has noticed more swelling during those days. Taking medication as directed without side effects. Checks BP at home sometimes and readings range 130/70s. Pt reports does not monitor sodium intake. Tries to stay hydrated.   Past Medical History:  Diagnosis Date  . Abnormal Pap smear of cervix    in her 20's  . Asthma   . Chronic kidney disease    kidney stones  . Pneumonia     Past Surgical History:  Procedure Laterality Date  . bladder stretching    . CHOLECYSTECTOMY    . LITHOTRIPSY    . OTHER SURGICAL HISTORY     peritoneal cyst removal  . TUBAL LIGATION      Family History  Problem Relation Age of Onset  . Hypertension Mother   . Diabetes Mother   . Cancer Mother        cervical  . Hypertension Father   . Cancer Maternal Aunt        breast  . Cancer Paternal Aunt        breast  . Heart attack Paternal Grandfather   . Stroke Paternal Grandfather   . Cancer Maternal Uncle        breast cancer  . Cervical cancer Cousin     Social History   Socioeconomic History  . Marital status: Married    Spouse name: Not on file  . Number of children: Not on file  . Years of education: Not on file  . Highest education level: Not on file  Occupational History  . Not on file  Tobacco Use  . Smoking status: Never Smoker  . Smokeless tobacco: Never  Used  Vaping Use  . Vaping Use: Never used  Substance and Sexual Activity  . Alcohol use: No  . Drug use: No  . Sexual activity: Yes    Partners: Male    Birth control/protection: Surgical    Comment: btl  Other Topics Concern  . Not on file  Social History Narrative  . Not on file   Social Determinants of Health   Financial Resource Strain: Not on file  Food Insecurity: Not on file  Transportation Needs: Not on file  Physical Activity: Not on file  Stress: Not on file  Social Connections: Not on file  Intimate Partner Violence: Not on file    Outpatient Medications Prior to Visit  Medication Sig Dispense Refill  . fexofenadine (ALLEGRA) 180 MG tablet Take 180 mg by mouth daily.    . fluticasone (FLONASE) 50 MCG/ACT nasal spray 1 spray each nostril following sinus rinses twice daily 16 g 11  . medroxyPROGESTERone (PROVERA) 5 MG tablet Take one tablet a day for 5 days every month starting on day 20 of your cycle. 15 tablet 3  . valsartan-hydrochlorothiazide (DIOVAN-HCT) 80-12.5 MG tablet Take 1 tablet by mouth daily. 60 tablet 0  . Vitamin D, Ergocalciferol, (DRISDOL) 1.25 MG (  50000 UNIT) CAPS capsule Take 1 capsule by mouth two times a week. 24 capsule 1   No facility-administered medications prior to visit.    Allergies  Allergen Reactions  . Bee Venom Anaphylaxis  . Iodine Dermatitis and Rash  . Other Anaphylaxis    ANT bites  . Latex Other (See Comments)  . Tape     Adhesives blisters  . Shellfish-Derived Products Palpitations and Rash    ROS Review of Systems Review of Systems:  A fourteen system review of systems was performed and found to be positive as per HPI.   Objective:    Physical Exam General:  Well Developed, well nourished, appropriate for stated age.  Neuro:  Alert and oriented,  extra-ocular muscles intact  HEENT:  Normocephalic, atraumatic, neck supple Skin:  no gross rash, warm, pink. Cardiac:  RRR, S1 S2 Respiratory:  ECTA B/L, Not  using accessory muscles, speaking in full sentences- unlabored. Vascular:  Ext warm, no cyanosis apprec.; cap RF less 2 sec. Psych:  No HI/SI, judgement and insight good, Euthymic mood. Full Affect. Normal behavior.   BP 131/70   Pulse 81   Temp (!) 97.5 F (36.4 C)   Ht '5\' 2"'  (1.575 m)   Wt (!) 330 lb 12.8 oz (150 kg)   LMP  (LMP Unknown)   SpO2 95%   BMI 60.50 kg/m  Wt Readings from Last 3 Encounters:  06/18/20 (!) 330 lb 12.8 oz (150 kg)  03/31/20 (!) 327 lb 3.2 oz (148.4 kg)  07/03/19 (!) 313 lb (142 kg)     Health Maintenance Due  Topic Date Due  . COVID-19 Vaccine (1) Never done  . HIV Screening  Never done  . Hepatitis C Screening  Never done  . COLONOSCOPY (Pts 45-59yr Insurance coverage will need to be confirmed)  Never done  . MAMMOGRAM  07/04/2017  . Zoster Vaccines- Shingrix (1 of 2) Never done    There are no preventive care reminders to display for this patient.  Lab Results  Component Value Date   TSH 2.290 03/31/2020   Lab Results  Component Value Date   WBC 9.2 03/31/2020   HGB 13.3 03/31/2020   HCT 40.6 03/31/2020   MCV 83 03/31/2020   PLT 297 03/31/2020   Lab Results  Component Value Date   NA 135 03/31/2020   K 4.3 03/31/2020   CO2 22 03/31/2020   GLUCOSE 97 03/31/2020   BUN 12 03/31/2020   CREATININE 0.61 03/31/2020   BILITOT 0.3 03/31/2020   ALKPHOS 83 03/31/2020   AST 16 03/31/2020   ALT 18 03/31/2020   PROT 6.3 03/31/2020   ALBUMIN 4.0 03/31/2020   CALCIUM 9.0 03/31/2020   ANIONGAP 9 11/02/2017   EGFR 107 03/31/2020   Lab Results  Component Value Date   CHOL 146 09/12/2019   Lab Results  Component Value Date   HDL 42 09/12/2019   Lab Results  Component Value Date   LDLCALC 78 09/12/2019   Lab Results  Component Value Date   TRIG 150 (H) 09/12/2019   Lab Results  Component Value Date   CHOLHDL 3.5 09/12/2019   Lab Results  Component Value Date   HGBA1C 5.3 03/31/2020      Assessment & Plan:   Problem  List Items Addressed This Visit   None   Visit Diagnoses    Hypertension, unspecified type    -  Primary   No abnormality detected on mental health assessment  Morbid obesity ([place])         Hypertension: -Improved and stable. -Continue current medication regimen. Last CMP normal. Will repeat CMP at follow up visit. -Recommend to follow a low sodium diet and continue with good hydration. -Will continue to monitor.  Morbid obesity: -Patient has gained approximately 3 pounds since last visit. Patient has tried multiple weight loss treatments with minimal success and bariatric surgery is an appropriate option. Patient is mentally stable for surgery. PHQ-9 and GAD-7 scores of 0.    No orders of the defined types were placed in this encounter.   Follow-up: Return in about 4 months (around 10/18/2020) for HTN.   Note:  This note was prepared with assistance of Dragon voice recognition software. Occasional wrong-word or sound-a-like substitutions may have occurred due to the inherent limitations of voice recognition software.   Lorrene Reid, PA-C

## 2020-07-06 ENCOUNTER — Other Ambulatory Visit: Payer: Self-pay | Admitting: Physician Assistant

## 2020-07-06 DIAGNOSIS — I10 Essential (primary) hypertension: Secondary | ICD-10-CM

## 2020-07-08 ENCOUNTER — Other Ambulatory Visit (HOSPITAL_COMMUNITY): Payer: Self-pay | Admitting: General Surgery

## 2020-07-08 ENCOUNTER — Other Ambulatory Visit: Payer: Self-pay | Admitting: General Surgery

## 2020-07-30 ENCOUNTER — Ambulatory Visit (HOSPITAL_COMMUNITY)
Admission: RE | Admit: 2020-07-30 | Discharge: 2020-07-30 | Disposition: A | Discharge status: Home / Self Care | Payer: Commercial Managed Care - PPO | Source: Ambulatory Visit | Attending: General Surgery | Admitting: General Surgery

## 2020-07-30 ENCOUNTER — Other Ambulatory Visit: Payer: Self-pay

## 2020-08-12 ENCOUNTER — Other Ambulatory Visit: Payer: Self-pay

## 2020-08-12 ENCOUNTER — Encounter: Payer: Commercial Managed Care - PPO | Attending: General Surgery | Admitting: Skilled Nursing Facility1

## 2020-08-12 ENCOUNTER — Encounter: Payer: Self-pay | Admitting: Skilled Nursing Facility1

## 2020-08-12 NOTE — Progress Notes (Signed)
Nutrition Assessment for Bariatric Surgery Medical Nutrition Therapy Appt Start Time: 3:45   End Time: 4:45  Patient was seen on 08/12/2020 for Pre-Operative Nutrition Assessment. Letter of approval faxed to [facility] bariatric surgery program coordinator on 08/12/2020.   Referral stated Supervised Weight Loss (SWL) visits needed: undocumented on referral; sent communication   Pt completed visits.   Pt has cleared nutrition requirements.   Planned surgery: sleeve gastrectomy  Pt expectation of surgery: to lose weight Pt expectation of dietitian: none stated    NUTRITION ASSESSMENT   Anthropometrics  Start weight at [facility]: 331.3 lbs (date: 08/12/2020)  Height: 62 in BMI: 60.60 kg/m2     Clinical  Medical hx: HTN, kidney stones Medications: see list  Labs: vitamin D 54.1, A1C 5.3 Notable signs/symptoms: some back pain Any previous deficiencies? No  Micronutrient Nutrition Focused Physical Exam: Hair: No issues observed Eyes: No issues observed Mouth: No issues observed Neck: No issues observed Nails: No issues observed Skin: No issues observed  Lifestyle & Dietary Hx  Pt states she tries to get her water in but reports it is a struggle.   Pt states she has noticed lately she is getting fuller faster.  Pt states she knows she really needs to figure out how to get more water in: Pt states she will try to bring a bag with her to have water in it while at work.   24-Hr Dietary Recall First Meal: skipped Snack:  Second Meal: skipped or chicken salad Snack: nuts Third Meal: hamburger steak + gravy + broccoli + potatoes Snack:  Beverages: water   Estimated Energy Needs Calories: 1500   NUTRITION DIAGNOSIS  Overweight/obesity ([place]-3.3) related to past poor dietary habits and physical inactivity as evidenced by patient w/ planned sleeve gastrectomy surgery following dietary guidelines for continued weight loss.    NUTRITION INTERVENTION  Nutrition  counseling (C-1) and education (E-2) to facilitate bariatric surgery goals.  Educated pt on micronutrient deficiencies post surgery and strategies to mitigate that risk   Pre-Op Goals Reviewed with the Patient Track food and beverage intake (pen and paper, MyFitness Pal, Baritastic app, etc.) Make healthy food choices while monitoring portion sizes Consume 3 meals per day or try to eat every 3-5 hours Avoid concentrated sugars and fried foods Keep sugar & fat in the single digits per serving on food labels Practice CHEWING your food (aim for applesauce consistency) Practice not drinking 15 minutes before, during, and 30 minutes after each meal and snack Avoid all carbonated beverages (ex: soda, sparkling beverages)  Limit caffeinated beverages (ex: coffee, tea, energy drinks) Avoid all sugar-sweetened beverages (ex: regular soda, sports drinks)  Avoid alcohol  Aim for 64-100 ounces of FLUID daily (with at least half of fluid intake being plain water)  Aim for at least 60-80 grams of PROTEIN daily Look for a liquid protein source that contains ?15 g protein and ?5 g carbohydrate (ex: shakes, drinks, shots) Make a list of non-food related activities Physical activity is an important part of a healthy lifestyle so keep it moving! The goal is to reach 150 minutes of exercise per week, including cardiovascular and weight baring activity.  *Goals that are bolded indicate the pt would like to start working towards these  Handouts Provided Include  Bariatric Surgery handouts (Nutrition Visits, Pre-Op Goals, Protein Shakes, Vitamins & Minerals)  Learning Style & Readiness for Change Teaching method utilized: Visual & Auditory  Demonstrated degree of understanding via: Teach Back  Readiness Level: contemplative  Barriers to learning/adherence to lifestyle change: work schedule      MONITORING & EVALUATION Dietary intake, weekly physical activity, body weight, and pre-op goals reached at next  nutrition visit.    Next Steps  Patient is to follow up at [place] for Pre-Op Class >2 weeks before surgery for further nutrition education.  Pt has completed visits. No further supervised visits required

## 2020-08-18 ENCOUNTER — Encounter: Payer: Self-pay | Admitting: Physician Assistant

## 2020-08-18 ENCOUNTER — Other Ambulatory Visit: Payer: Self-pay

## 2020-08-18 DIAGNOSIS — Z1211 Encounter for screening for malignant neoplasm of colon: Secondary | ICD-10-CM

## 2020-08-18 DIAGNOSIS — I10 Essential (primary) hypertension: Secondary | ICD-10-CM

## 2020-08-18 DIAGNOSIS — E559 Vitamin D deficiency, unspecified: Secondary | ICD-10-CM

## 2020-08-18 MED ORDER — VALSARTAN-HYDROCHLOROTHIAZIDE 80-12.5 MG PO TABS: 1.0000 | ORAL_TABLET | Freq: Every day | ORAL | 0 refills | Status: DC

## 2020-08-18 MED ORDER — VITAMIN D (ERGOCALCIFEROL) 1.25 MG (50000 UNIT) PO CAPS: ORAL_CAPSULE | ORAL | 1 refills | Status: DC

## 2020-09-04 ENCOUNTER — Encounter: Payer: Self-pay | Admitting: Gastroenterology

## 2020-09-08 ENCOUNTER — Other Ambulatory Visit: Payer: Self-pay | Admitting: Physician Assistant

## 2020-09-08 DIAGNOSIS — Z1231 Encounter for screening mammogram for malignant neoplasm of breast: Secondary | ICD-10-CM

## 2020-09-10 ENCOUNTER — Ambulatory Visit
Admission: RE | Admit: 2020-09-10 | Discharge: 2020-09-10 | Disposition: A | Discharge status: Home / Self Care | Payer: Commercial Managed Care - PPO | Source: Ambulatory Visit

## 2020-09-10 ENCOUNTER — Other Ambulatory Visit: Payer: Self-pay

## 2020-09-10 DIAGNOSIS — Z1231 Encounter for screening mammogram for malignant neoplasm of breast: Secondary | ICD-10-CM

## 2020-09-15 ENCOUNTER — Telehealth: Payer: Self-pay | Admitting: *Deleted

## 2020-09-15 NOTE — Telephone Encounter (Signed)
Dr.Armbruster,  This patient is for a direct screening colonoscopy with you. Per chart last BMI was 60.50 on 06/18/20. Hx of asthma. [place] for direct [facility] or OV? Please advise. Thank you, Media Pizzini pv

## 2020-09-15 NOTE — Telephone Encounter (Signed)
Patient request to be seen ASAP due to having gastric bypass surgery and needs the colonoscopy BEFORE this surgery in Sept. Made OV 1st available was with Dr.Cunningham for 9/1. Colon and PV cancelled. Patient is aware.

## 2020-09-15 NOTE — Telephone Encounter (Signed)
Office visit first.  Thanks  

## 2020-09-17 ENCOUNTER — Encounter: Payer: Self-pay | Admitting: Gastroenterology

## 2020-09-17 ENCOUNTER — Ambulatory Visit (INDEPENDENT_AMBULATORY_CARE_PROVIDER_SITE_OTHER): Payer: Commercial Managed Care - PPO | Admitting: Gastroenterology

## 2020-09-17 DIAGNOSIS — Z1211 Encounter for screening for malignant neoplasm of colon: Secondary | ICD-10-CM

## 2020-09-17 DIAGNOSIS — K625 Hemorrhage of anus and rectum: Secondary | ICD-10-CM | POA: Diagnosis not present

## 2020-09-17 DIAGNOSIS — Z1212 Encounter for screening for malignant neoplasm of rectum: Secondary | ICD-10-CM | POA: Diagnosis not present

## 2020-09-17 MED ORDER — PLENVU 140 G PO SOLR: ORAL | 0 refills | Status: DC

## 2020-09-17 NOTE — Progress Notes (Signed)
HPI : Abigail Cruz is a very pleasant 55 year old female with morbid obesity who is referred by Barrington Ellison PA-C for initial screening colonoscopy.  She is pending a gastric sleeve surgery, but the surgery not be scheduled until she has had a colonoscopy.  The patient does have a history of intermittent blood in the stool.  The blood will be noted within the stool itself, and sometimes she will see clots.  This was first noted a few months ago, and she can go weeks without seeing any blood.  The last time she saw blood was about 2 weeks ago.  She denies any other chronic GI symptoms to include abdominal pain, constipation, diarrhea.  She does have excessive belching at times, but denies other symptoms of GERD such as heartburn or acid regurgitation.  No dysphagia. She has never had a colonoscopy before.  She did have an upper endoscopy about 10 years ago in the setting of gallstones symptoms.  She has not had any upper GI symptoms since her gallbladder was removed. She denies any history of cardiopulmonary comorbidities other than allergen induced asthma.  She has not needed to use her inhaler since she moved from [place] to [place] 8 years ago.  She denies symptoms of chest pain/chest pressure or exertional dyspnea, orthopnea or lower extremity edema.  Past Medical History:  Diagnosis Date   Abnormal Pap smear of cervix    in her 20's   Asthma    Chronic kidney disease    kidney stones   Pneumonia      Past Surgical History:  Procedure Laterality Date   bladder stretching     CHOLECYSTECTOMY     LITHOTRIPSY     OTHER SURGICAL HISTORY     peritoneal cyst removal   TUBAL LIGATION     Family History  Problem Relation Age of Onset   Hypertension Mother    Diabetes Mother    Cancer Mother        cervical   Hypertension Father    Cancer Maternal Aunt        breast   Cancer Paternal Aunt        breast   Heart attack Paternal Grandfather    Stroke Paternal Grandfather     Cancer Maternal Uncle        breast cancer   Cervical cancer Cousin    Social History   Tobacco Use   Smoking status: Never   Smokeless tobacco: Never  Vaping Use   Vaping Use: Never used  Substance Use Topics   Alcohol use: No   Drug use: No   Current Outpatient Medications  Medication Sig Dispense Refill   fexofenadine (ALLEGRA) 180 MG tablet Take 180 mg by mouth daily.     fluticasone (FLONASE) 50 MCG/ACT nasal spray 1 spray each nostril following sinus rinses twice daily 16 g 11   medroxyPROGESTERone (PROVERA) 5 MG tablet Take one tablet a day for 5 days every month starting on day 20 of your cycle. 15 tablet 3   valsartan-hydrochlorothiazide (DIOVAN-HCT) 80-12.5 MG tablet Take 1 tablet by mouth daily. 90 tablet 0   Vitamin D, Ergocalciferol, (DRISDOL) 1.25 MG (50000 UNIT) CAPS capsule Take 1 capsule by mouth two times a week. 24 capsule 1   No current facility-administered medications for this visit.   Allergies  Allergen Reactions   Bee Venom Anaphylaxis   Iodine Dermatitis and Rash   Other Anaphylaxis    ANT bites   Latex Other (See  Comments)   Tape     Adhesives blisters   Shellfish-Derived Products Palpitations and Rash     Review of Systems: All systems reviewed and negative except where noted in HPI.    MM 3D SCREEN BREAST BILATERAL  Result Date: 09/15/2020 CLINICAL DATA:  Screening. EXAM: DIGITAL SCREENING BILATERAL MAMMOGRAM WITH TOMOSYNTHESIS AND CAD TECHNIQUE: Bilateral screening digital craniocaudal and mediolateral oblique mammograms were obtained. Bilateral screening digital breast tomosynthesis was performed. The images were evaluated with computer-aided detection. COMPARISON:  Previous exam(s). ACR Breast Density Category a: The breast tissue is almost entirely fatty. FINDINGS: There are no findings suspicious for malignancy. IMPRESSION: No mammographic evidence of malignancy. A result letter of this screening mammogram will be mailed directly to  the patient. RECOMMENDATION: Screening mammogram in one year. (Code:SM-B-01Y) BI-RADS CATEGORY  1: Negative. Electronically Signed   By: Everlean Alstrom M.D.   On: 09/15/2020 09:13    Physical Exam: BP 124/72   Pulse 79   Ht '5\' 2"'$  (1.575 m)   Wt (!) 329 lb (149.2 kg)   LMP 08/20/2020   BMI 60.17 kg/m  Constitutional: Pleasant,well-developed, Caucasian female in no acute distress. HEENT: Normocephalic and atraumatic. Conjunctivae are normal. No scleral icterus.  Mallampati 2 Cardiovascular: Heart sounds difficult to hear due to habitus; normal rate, regular rhythm.  Pulmonary/chest: Effort normal and breath sounds normal. No wheezing, rales or rhonchi. Abdominal: Soft, nondistended, nontender. Bowel sounds active throughout. There are no masses palpable. No hepatomegaly. Extremities: no edema Neurological: Alert and oriented to person place and time. Skin: Skin is warm and dry. No rashes noted. Psychiatric: Normal mood and affect. Behavior is normal.  CBC    Component Value Date/Time   WBC 9.2 03/31/2020 0936   WBC 12.0 (H) 11/02/2017 0620   RBC 4.92 03/31/2020 0936   RBC 4.68 11/02/2017 0620   HGB 13.3 03/31/2020 0936   HCT 40.6 03/31/2020 0936   PLT 297 03/31/2020 0936   MCV 83 03/31/2020 0936   MCH 27.0 03/31/2020 0936   MCH 28.2 11/02/2017 0620   MCHC 32.8 03/31/2020 0936   MCHC 32.1 11/02/2017 0620   RDW 15.6 (H) 03/31/2020 0936   LYMPHSABS 2.0 03/31/2020 0936   MONOABS 1.0 11/02/2017 0620   EOSABS 0.3 03/31/2020 0936   BASOSABS 0.1 03/31/2020 0936    CMP     Component Value Date/Time   NA 135 03/31/2020 0936   K 4.3 03/31/2020 0936   CL 100 03/31/2020 0936   CO2 22 03/31/2020 0936   GLUCOSE 97 03/31/2020 0936   GLUCOSE 101 (H) 11/02/2017 0620   BUN 12 03/31/2020 0936   CREATININE 0.61 03/31/2020 0936   CREATININE 0.60 08/11/2015 0913   CALCIUM 9.0 03/31/2020 0936   PROT 6.3 03/31/2020 0936   ALBUMIN 4.0 03/31/2020 0936   AST 16 03/31/2020 0936   ALT 18  03/31/2020 0936   ALKPHOS 83 03/31/2020 0936   BILITOT 0.3 03/31/2020 0936   GFRNONAA 105 09/12/2019 0802   GFRNONAA >89 08/11/2015 0913   GFRAA 121 09/12/2019 0802   GFRAA >89 08/11/2015 0913     ASSESSMENT AND PLAN: 55 year old female pending gastric sleeve surgery after she completes her screening colonoscopy.  She does have new intermittent bright red blood per rectum with clots, but otherwise no GI symptoms.  Based on facility bylaws, the patient is not eligible to have a colonoscopy performed in our endoscopy center due to her BMI.  She will need to have a colonoscopy in the hospital setting.  Personally do not have any hospital slots available until January.  The patient needs a colonoscopy to be performed sooner than that so she can have her surgery before the end of the year.  We will look into my colleagues availability for hospital slots within the next 2 months.    Screening colonoscopy - To be performed in hospital setting by one of my colleagues  Blood in stool, likely hemorrhoids - Colonoscopy to exclude other causes  The details, risks (including bleeding, perforation, infection, missed lesions, medication reactions and possible hospitalization or surgery if complications occur), benefits, and alternatives to colonoscopy with possible biopsy and possible polypectomy were discussed with the patient and she consents to proceed.   Kenzie Thoreson E. Candis Schatz, MD [facility]     CC:  Lorrene Reid, PA-C

## 2020-09-17 NOTE — Patient Instructions (Signed)
If you are age 53 or older, your body mass index should be between 23-30. Your Body mass index is 60.17 kg/m. If this is out of the aforementioned range listed, please consider follow up with your Primary Care Provider.  If you are age 33 or younger, your body mass index should be between 19-25. Your Body mass index is 60.17 kg/m. If this is out of the aformentioned range listed, please consider follow up with your Primary Care Provider.   You have been scheduled for a colonoscopy. Please follow written instructions given to you at your visit today.  Please pick up your prep supplies at the pharmacy within the next 1-3 days. If you use inhalers (even only as needed), please bring them with you on the day of your procedure.   The [facility] providers would like to encourage you to use [facility] to communicate with providers for non-urgent requests or questions.  Due to long hold times on the telephone, sending your provider a message by [facility] may be a faster and more efficient way to get a response.  Please allow 48 business hours for a response.  Please remember that this is for non-urgent requests.   It was a pleasure to see you today!  Thank you for trusting me with your gastrointestinal care!     Scott E. Candis Schatz, MD

## 2020-10-02 ENCOUNTER — Encounter: Payer: Commercial Managed Care - PPO | Admitting: Gastroenterology

## 2020-10-16 NOTE — Progress Notes (Signed)
Attempted to obtain medical history via telephone, unable to reach at this time. I left a voicemail to return pre surgical testing department's phone call.  

## 2020-10-19 ENCOUNTER — Ambulatory Visit: Payer: Commercial Managed Care - PPO | Admitting: Physician Assistant

## 2020-10-27 ENCOUNTER — Encounter (HOSPITAL_COMMUNITY)
Admission: RE | Disposition: A | Discharge status: Home / Self Care | Payer: Self-pay | Source: Ambulatory Visit | Attending: Internal Medicine

## 2020-10-27 ENCOUNTER — Encounter (HOSPITAL_COMMUNITY): Payer: Self-pay | Admitting: Internal Medicine

## 2020-10-27 ENCOUNTER — Ambulatory Visit (HOSPITAL_COMMUNITY): Payer: Commercial Managed Care - PPO | Admitting: Anesthesiology

## 2020-10-27 ENCOUNTER — Other Ambulatory Visit: Payer: Self-pay

## 2020-10-27 ENCOUNTER — Ambulatory Visit (HOSPITAL_COMMUNITY)
Admission: RE | Admit: 2020-10-27 | Discharge: 2020-10-27 | Disposition: A | Discharge status: Home / Self Care | Payer: Commercial Managed Care - PPO | Source: Ambulatory Visit | Attending: Internal Medicine | Admitting: Internal Medicine

## 2020-10-27 DIAGNOSIS — K921 Melena: Secondary | ICD-10-CM | POA: Diagnosis present

## 2020-10-27 DIAGNOSIS — D125 Benign neoplasm of sigmoid colon: Secondary | ICD-10-CM | POA: Diagnosis not present

## 2020-10-27 DIAGNOSIS — K5731 Diverticulosis of large intestine without perforation or abscess with bleeding: Secondary | ICD-10-CM | POA: Insufficient documentation

## 2020-10-27 DIAGNOSIS — Z1211 Encounter for screening for malignant neoplasm of colon: Secondary | ICD-10-CM | POA: Diagnosis not present

## 2020-10-27 DIAGNOSIS — Z79899 Other long term (current) drug therapy: Secondary | ICD-10-CM | POA: Insufficient documentation

## 2020-10-27 HISTORY — PX: COLONOSCOPY WITH PROPOFOL: SHX5780

## 2020-10-27 HISTORY — PX: POLYPECTOMY: SHX5525

## 2020-10-27 HISTORY — PX: SUBMUCOSAL TATTOO INJECTION: SHX6856

## 2020-10-27 SURGERY — COLONOSCOPY WITH PROPOFOL
Anesthesia: Monitor Anesthesia Care

## 2020-10-27 MED ORDER — LACTATED RINGERS IV SOLN: INTRAVENOUS | Status: DC

## 2020-10-27 MED ORDER — PROPOFOL 500 MG/50ML IV EMUL
INTRAVENOUS | Status: DC | PRN
  Administered 2020-10-27: 100 ug/kg/min via INTRAVENOUS

## 2020-10-27 MED ORDER — LIDOCAINE HCL 1 % IJ SOLN
INTRAMUSCULAR | Status: DC | PRN
  Administered 2020-10-27: 50 mg via INTRADERMAL

## 2020-10-27 MED ORDER — PROPOFOL 500 MG/50ML IV EMUL
INTRAVENOUS | Status: AC
  Filled 2020-10-27: qty 50

## 2020-10-27 MED ORDER — SPOT INK MARKER SYRINGE KIT
PACK | SUBMUCOSAL | Status: DC | PRN
  Administered 2020-10-27: 3.5 mL via SUBMUCOSAL

## 2020-10-27 MED ORDER — PROPOFOL 500 MG/50ML IV EMUL
INTRAVENOUS | Status: DC | PRN
  Administered 2020-10-27: 20 mg via INTRAVENOUS
  Administered 2020-10-27: 10 mg via INTRAVENOUS
  Administered 2020-10-27 (×4): 20 mg via INTRAVENOUS

## 2020-10-27 SURGICAL SUPPLY — 22 items

## 2020-10-27 NOTE — H&P (Signed)
GI PREPROCEDURE H&P  Patient was evaluated in the office by Dr. Candis Schatz September 17, 2020.  She is set up today for outpatient colonoscopy.  This is being performed at the hospital due to BMI greater than 50.  His assessment and plan as below.  No interval changes.  ASSESSMENT AND PLAN: 55 year old female pending gastric sleeve surgery after she completes her screening colonoscopy.  She does have new intermittent bright red blood per rectum with clots, but otherwise no GI symptoms.  Based on facility bylaws, the patient is not eligible to have a colonoscopy performed in our endoscopy center due to her BMI.  She will need to have a colonoscopy in the hospital setting.  Personally do not have any hospital slots available until January.  The patient needs a colonoscopy to be performed sooner than that so she can have her surgery before the end of the year.  We will look into my colleagues availability for hospital slots within the next 2 months.     Screening colonoscopy - To be performed in hospital setting by one of my colleagues  Blood in stool, likely hemorrhoids - Colonoscopy to exclude other causes   The details, risks (including bleeding, perforation, infection, missed lesions, medication reactions and possible hospitalization or surgery if complications occur), benefits, and alternatives to colonoscopy with possible biopsy and possible polypectomy were discussed with the patient and she consents to proceed.    Scott E. Candis Schatz, MD [facility]     Docia Chuck. Geri [place]in[place]., M.D. [facility] Division of Gastroenterology

## 2020-10-27 NOTE — Op Note (Signed)
[facility] Patient Name: Abigail Cruz Procedure Date: 10/27/2020 MRN: [id number] Attending MD: Docia Chuck. Henrene Pastor , MD Date of Birth: 1965-05-13 CSN: [id number] Age: 54 Admit Type: Outpatient Procedure:                Colonoscopy with hot snare polypectomy x1;                            submucosal injection?"tattoo Indications:              Screening for colorectal malignant neoplasm.                            Patient was also reporting rectal bleeding.                            Evaluated by Dr. Candis Schatz in the office. Providers:                Docia Chuck. Henrene Pastor, MD, Debi Mays, RN, Frazier Richards,                            Technician, Cletis Athens, Technician Referring MD:             Dustin Flock, MD Medicines:                Monitored Anesthesia Care Complications:            No immediate complications. Estimated blood loss:                            None. Estimated Blood Loss:     Estimated blood loss: none. Procedure:                Pre-Anesthesia Assessment:                           - Prior to the procedure, a History and Physical                            was performed, and patient medications and                            allergies were reviewed. The patient's tolerance of                            previous anesthesia was also reviewed. The risks                            and benefits of the procedure and the sedation                            options and risks were discussed with the patient.                            All questions were answered, and informed consent  was obtained. Prior Anticoagulants: The patient has                            taken no previous anticoagulant or antiplatelet                            agents. ASA Grade Assessment: III - A patient with                            severe systemic disease. After reviewing the risks                            and benefits, the patient was deemed in                             satisfactory condition to undergo the procedure.                           After obtaining informed consent, the colonoscope                            was passed under direct vision. Throughout the                            procedure, the patient's blood pressure, pulse, and                            oxygen saturations were monitored continuously. The                            CF-HQ190L ([id number]) Olympus colonoscope was                            introduced through the anus and advanced to the the                            ascending colon.. The rectum was photographed. The                            quality of the bowel preparation was excellent. The                            colonoscopy was performed with difficulty due to                            body habitus not allowing examination beyond the                            ascending colon. The patient tolerated the                            procedure well. The bowel preparation used was  SUPREP via split dose instruction. Scope In: 10:06:20 AM Scope Out: 10:33:08 AM Total Procedure Duration: 0 hours 26 minutes 47 seconds  Findings:      A 30 mm polyp was found in the sigmoid colon. The polyp was       pedunculated. The polyp was removed with a hot snare. Resection and       retrieval were complete. Area was tattooed with an injection of Spot       (carbon black).      Multiple diverticula were found in the sigmoid colon. Unable to advance       beyond the ascending colon due to body habitus.      The exam was otherwise without abnormality on direct and retroflexion       views. Impression:               - One 30 mm polyp in the sigmoid colon, removed                            with a hot snare. Resected and retrieved. Tattooed.                           - Diverticulosis in the sigmoid colon.                           - The examination, to the ascending colon, was                             otherwise normal on direct and retroflexion views. Moderate Sedation:      none Recommendation:           - Repeat colonoscopy in 3 years for surveillance,                            if the polyp is benign. Follow-up with Dr.                            Candis Schatz                           - Patient has a contact number available for                            emergencies. The signs and symptoms of potential                            delayed complications were discussed with the                            patient. Return to normal activities tomorrow.                            Written discharge instructions were provided to the                            patient.                           -  Resume previous diet.                           - Continue present medications.                           - Await pathology results.                           -Arrange virtual colonoscopy if feasible                            "incomplete colonoscopy, evaluate proximal colon".                            Under the guidance of Dr. Candis Schatz                           Discussed with patient and husband. Procedure Code(s):        --- Professional ---                           [phone], Colonoscopy, flexible; with removal of                            tumor(s), polyp(s), or other lesion(s) by snare                            technique                           45381, Colonoscopy, flexible; with directed                            submucosal injection(s), any substance Diagnosis Code(s):        --- Professional ---                           Z12.11, Encounter for screening for malignant                            neoplasm of colon                           K63.5, Polyp of colon                           K57.30, Diverticulosis of large intestine without                            perforation or abscess without bleeding CPT copyright 2019 American Medical Association. All rights reserved. The codes  documented in this report are preliminary and upon coder review may  be revised to meet current compliance requirements. Docia Chuck. Henrene Pastor, MD 10/27/2020 10:42:25 AM This report has been signed electronically. Number of Addenda: 0

## 2020-10-27 NOTE — Transfer of Care (Signed)
Immediate Anesthesia Transfer of Care Note  Patient: Abigail Cruz  Procedure(s) Performed: COLONOSCOPY WITH PROPOFOL POLYPECTOMY SUBMUCOSAL TATTOO INJECTION  Patient Location: PACU and Endoscopy Unit  Anesthesia Type:MAC  Level of Consciousness: awake, alert , oriented and patient cooperative  Airway & Oxygen Therapy: Patient Spontanous Breathing and Patient connected to face mask oxygen  Post-op Assessment: Report given to RN and Post -op Vital signs reviewed and stable  Post vital signs: Reviewed and stable  Last Vitals:  Vitals Value Taken Time  BP 115/56 10/27/20 1039  Temp    Pulse 73 10/27/20 1040  Resp 26 10/27/20 1040  SpO2 100 % 10/27/20 1040  Vitals shown include unvalidated device data.  Last Pain:  Vitals:   10/27/20 1039  TempSrc:   PainSc: 0-No pain         Complications: No notable events documented.

## 2020-10-27 NOTE — Discharge Instructions (Signed)

## 2020-10-27 NOTE — Anesthesia Preprocedure Evaluation (Signed)
Anesthesia Evaluation  Patient identified by MRN, date of birth, ID band Patient awake    Reviewed: Allergy & Precautions, NPO status , Patient's Chart, lab work & pertinent test results  History of Anesthesia Complications Negative for: history of anesthetic complications  Airway Mallampati: II  TM Distance: >3 FB Neck ROM: Full    Dental  (+) Dental Advisory Given   Pulmonary COPD,  COPD inhaler,    breath sounds clear to auscultation       Cardiovascular hypertension, Pt. on medications  Rhythm:Regular Rate:Normal     Neuro/Psych Anxiety negative neurological ROS     GI/Hepatic negative GI ROS, Neg liver ROS,   Endo/Other  Morbid obesity  Renal/GU Renal InsufficiencyRenal disease     Musculoskeletal   Abdominal (+) + obese,   Peds  Hematology negative hematology ROS (+)   Anesthesia Other Findings   Reproductive/Obstetrics BTL                             Anesthesia Physical Anesthesia Plan  ASA: 3  Anesthesia Plan: MAC   Post-op Pain Management:    Induction:   PONV Risk Score and Plan: 2 and Ondansetron and Treatment may vary due to age or medical condition  Airway Management Planned: Natural Airway and Simple Face Mask  Additional Equipment: None  Intra-op Plan:   Post-operative Plan:   Informed Consent: I have reviewed the patients History and Physical, chart, labs and discussed the procedure including the risks, benefits and alternatives for the proposed anesthesia with the patient or authorized representative who has indicated his/her understanding and acceptance.     Dental advisory given  Plan Discussed with: CRNA and Surgeon  Anesthesia Plan Comments:         Anesthesia Quick Evaluation

## 2020-10-27 NOTE — Anesthesia Postprocedure Evaluation (Signed)
Anesthesia Post Note  Patient: Abigail Cruz  Procedure(s) Performed: COLONOSCOPY WITH PROPOFOL POLYPECTOMY SUBMUCOSAL TATTOO INJECTION     Patient location during evaluation: Endoscopy Anesthesia Type: MAC Level of consciousness: awake and alert, patient cooperative and oriented Pain management: pain level controlled Vital Signs Assessment: post-procedure vital signs reviewed and stable Respiratory status: spontaneous breathing, nonlabored ventilation and respiratory function stable Cardiovascular status: blood pressure returned to baseline and stable Postop Assessment: no apparent nausea or vomiting and able to ambulate Anesthetic complications: no   No notable events documented.  Last Vitals:  Vitals:   10/27/20 1039 10/27/20 1049  BP: (!) 115/56 (!) 123/98  Pulse: 75 77  Resp: (!) 23 17  Temp: 36.5 C   SpO2: 100% 98%    Last Pain:  Vitals:   10/27/20 1049  TempSrc:   PainSc: 0-No pain                 Karan Inclan,E. Racer Quam

## 2020-10-28 LAB — SURGICAL PATHOLOGY

## 2020-10-29 ENCOUNTER — Encounter (HOSPITAL_COMMUNITY): Payer: Self-pay | Admitting: Internal Medicine

## 2020-10-29 ENCOUNTER — Other Ambulatory Visit: Payer: Self-pay

## 2020-10-29 ENCOUNTER — Telehealth: Payer: Self-pay

## 2020-10-29 DIAGNOSIS — Z1211 Encounter for screening for malignant neoplasm of colon: Secondary | ICD-10-CM

## 2020-10-29 DIAGNOSIS — K625 Hemorrhage of anus and rectum: Secondary | ICD-10-CM

## 2020-10-29 NOTE — Telephone Encounter (Signed)
Order for virtual colonoscopy in epic. I called [facility], they still do not have an alternative prep yet. They will contact patient to schedule. No estimated date. Pt is aware. Felecia said pt's weight is fine for virtual colon.

## 2020-10-29 NOTE — Telephone Encounter (Signed)
-----   Message from Daryel November, MD sent at 10/28/2020  5:00 PM EDT ----- Regarding: RE: Needs proximal colon clearance Yes I would like to go ahead and get her scheduled for a virtual colonoscopy.  Where do we typically send our [place], and do you know if there is a weight or BMI limit?  ----- Message ----- From: Algernon Huxley, RN Sent: 10/28/2020   4:22 PM EDT To: Daryel November, MD Subject: FW: Needs proximal colon clearance             Dr. Candis Schatz,  Spoke with pt and she is aware of results and recall date. She is wanting to have bariatric sleeve surgery before the end of the year. She wants to know if she needs to go ahead and schedule another test as mentioned below from Dr. Henrene Pastor prior to having bariatric surgery. Please advise.   ----- Message ----- From: Irene Shipper, MD Sent: 10/27/2020  10:51 AM EDT To: Algernon Huxley, RN, Daryel November, MD Subject: Needs proximal colon clearance                 Scott,  You saw this patient in clinic for rectal bleeding and screening colonoscopy.  She has a BMI of about 60 and is anticipating bariatric surgery.  She was found to have a large sigmoid colon polyp (see my report) which explains her rectal bleeding.  This was removed and the area tattooed.  The examination was incomplete (to somewhere in the right colon) due to body habitus.  She will need some form of clearance of the right colon.  She may be a candidate for virtual colonoscopy with one of the scans that can accommodate the morbidly obese.  I will leave this to you and your nurse.  Thanks.  Jenny Reichmann

## 2020-10-30 NOTE — H&P (Signed)
GI PREPROCEDURE H&P  Patient was evaluated in the office by Dr. Candis Schatz September 17, 2020.  She is set up today for outpatient colonoscopy.  This is being performed at the hospital due to BMI greater than 50.  His assessment and plan as below.  No interval changes.   Vital signs: BP (!) 146/77   Pulse 69   Temp 97.7 F (36.5 C) (Oral)   Resp 20   Ht 5' 1.5" (1.562 m)   Wt (!) 145.2 kg   SpO2 99%   BMI 59.48 kg/m  General: Well-developed, well-nourished, no acute distress HEENT: Sclerae are anicteric, conjunctiva pink. Oral mucosa intact Lungs: Clear Heart: Regular Abdomen: soft, nontender, nondistended, no obvious ascites, no peritoneal signs, normal bowel sounds. No organomegaly. Extremities: No edema Psychiatric: alert and oriented x3. Cooperative   ASSESSMENT AND PLAN: 55 year old female pending gastric sleeve surgery after she completes her screening colonoscopy.  She does have new intermittent bright red blood per rectum with clots, but otherwise no GI symptoms.  Based on facility bylaws, the patient is not eligible to have a colonoscopy performed in our endoscopy center due to her BMI.  She will need to have a colonoscopy in the hospital setting.  Personally do not have any hospital slots available until January.  The patient needs a colonoscopy to be performed sooner than that so she can have her surgery before the end of the year.  We will look into my colleagues availability for hospital slots within the next 2 months.     Screening colonoscopy - To be performed in hospital setting by one of my colleagues  Blood in stool, likely hemorrhoids - Colonoscopy to exclude other causes   The details, risks (including bleeding, perforation, infection, missed lesions, medication reactions and possible hospitalization or surgery if complications occur), benefits, and alternatives to colonoscopy with possible biopsy and possible polypectomy were discussed with the patient and she  consents to proceed.    Scott E. Candis Schatz, MD [facility]     Docia Chuck. Geri Seminole., M.D. [facility] Division of Gastroenterology

## 2020-11-07 ENCOUNTER — Other Ambulatory Visit: Payer: Self-pay | Admitting: Physician Assistant

## 2020-11-07 DIAGNOSIS — I10 Essential (primary) hypertension: Secondary | ICD-10-CM

## 2020-12-07 ENCOUNTER — Encounter: Payer: Self-pay | Admitting: Physician Assistant

## 2020-12-14 ENCOUNTER — Encounter: Payer: Commercial Managed Care - PPO | Attending: General Surgery | Admitting: Skilled Nursing Facility1

## 2020-12-14 ENCOUNTER — Other Ambulatory Visit: Payer: Self-pay

## 2020-12-14 NOTE — Progress Notes (Signed)
Pre-Operative Nutrition Class:    Patient was seen on 12/14/2020 for Pre-Operative Bariatric Surgery Education at the [facility].    Surgery date:  Surgery type: sleeve Start weight at [facility]: 331.3 Weight today: 339.7 pounds  Samples given per MNT protocol. Patient educated on appropriate usage: Ensure max exp: March 17, 2021 Ensure max lot: [id number]  Chewable bariatric advantage: advanced multi EA exp: 08/23 Chewable bariatric advantage: advanced multi EA lot: [id number]  Bariatric advantage calcium citrate exp: 02/23 Bariatric advantage calcium citrate lot: [id number]   The following the learning objectives were met by the patient during this course: Identify Pre-Op Dietary Goals and will begin 2 weeks pre-operatively Identify appropriate sources of fluids and proteins  State protein recommendations and appropriate sources pre and post-operatively Identify Post-Operative Dietary Goals and will follow for 2 weeks post-operatively Identify appropriate multivitamin and calcium sources Describe the need for physical activity post-operatively and will follow MD recommendations State when to call healthcare provider regarding medication questions or post-operative complications When having a diagnosis of diabetes understanding hypoglycemia symptoms and the inclusion of 1 complex carbohydrate per meal  Handouts given during class include: Pre-Op Bariatric Surgery Diet Handout Protein Shake Handout Post-Op Bariatric Surgery Nutrition Handout BELT Program Information Flyer Support Group Information Flyer [facility] Bariatric Supplements Price List  Follow-Up Plan: Patient will follow-up at [facility] 2 weeks post operatively for diet advancement per MD.

## 2020-12-28 NOTE — Progress Notes (Signed)
Requested surgery orders with Dr. Dois Davenport office.

## 2020-12-30 ENCOUNTER — Ambulatory Visit: Payer: Self-pay | Admitting: General Surgery

## 2020-12-30 DIAGNOSIS — Z01818 Encounter for other preprocedural examination: Secondary | ICD-10-CM

## 2020-12-31 NOTE — Patient Instructions (Signed)
DUE TO COVID-19 ONLY ONE VISITOR IS ALLOWED TO COME WITH YOU AND STAY IN THE WAITING ROOM ONLY DURING PRE OP AND PROCEDURE DAY OF SURGERY IF YOU ARE GOING HOME AFTER SURGERY. IF YOU ARE SPENDING THE NIGHT 2 PEOPLE MAY VISIT WITH YOU IN YOUR PRIVATE ROOM AFTER SURGERY UNTIL VISITING  HOURS ARE OVER AT 800 PM AND 1  VISITOR  MAY  SPEND THE NIGHT.   YOU NEED TO HAVE A COVID 19 TEST ON__12/15__ THIS TEST MUST BE DONE BEFORE SURGERY,  COVID TESTING SITE  IS LOCATED AT [place], [place]. REMAIN IN YOUR CAR THIS IS A DRIVE UP TEST. AFTER YOUR COVID TEST PLEASE WEAR A MASK OUT IN PUBLIC AND SOCIAL DISTANCE AND [place] YOUR HANDS FREQUENTLY, ALSO ASK ALL YOUR CLOSE CONTACT PERSONS TO WEAR A MASK AND SOCIAL DISTANCE AND [place] THEIR HANDS FREQUENTLY ALSO.               Abigail Cruz     Your procedure is scheduled on: 01/04/21   Report to [facility] Main  Entrance   Report to admitting at  8:15 AM     Call this number if you have problems the morning of surgery [phone]    MORNING OF SURGERY DRINK:   DRINK 1 G2 drink BEFORE YOU LEAVE HOME, DRINK ALL OF THE  G2 DRINK AT ONE TIME.    NO SOLID FOOD AFTER 6:00 PM THE NIGHT BEFORE YOUR SURGERY.   YOU MAY DRINK CLEAR FLUIDS until 7:30 am  THE G2 DRINK YOU DRINK BEFORE YOU LEAVE HOME WILL BE THE LAST FLUIDS YOU DRINK BEFORE SURGERY.    PAIN IS EXPECTED AFTER SURGERY AND WILL NOT BE COMPLETELY ELIMINATED.   AMBULATION AND TYLENOL WILL HELP REDUCE INCISIONAL AND GAS PAIN. MOVEMENT IS KEY!  YOU ARE EXPECTED TO BE OUT OF BED WITHIN 4 HOURS OF ADMISSION TO YOUR PATIENT ROOM.  SITTING IN THE RECLINER THROUGHOUT THE DAY IS IMPORTANT FOR DRINKING FLUIDS AND MOVING GAS THROUGHOUT THE GI TRACT.  COMPRESSION STOCKINGS SHOULD BE WORN [place] UNLESS YOU ARE WALKING.   INCENTIVE SPIROMETER SHOULD BE USED EVERY HOUR WHILE AWAKE TO DECREASE POST-OPERATIVE COMPLICATIONS SUCH AS PNEUMONIA.  WHEN DISCHARGED  HOME, IT IS IMPORTANT TO CONTINUE TO WALK EVERY HOUR AND USE THE INCENTIVE SPIROMETER EVERY HOUR.    CLEAR LIQUID DIET   Foods Allowed                                                                     Foods Excluded  Coffee and tea, regular and decaf                             liquids that you cannot  Plain Jell-O any favor except red or purple                                           see through such as: Fruit ices (not with fruit pulp)  milk, soups, orange juice  Iced Popsicles                                    All solid food Carbonated beverages, regular and diet                                    Cranberry, grape and apple juices Sports drinks like Gatorade Lightly seasoned clear broth or consume(fat free) Sugar  _____________________________________________________________________   BRUSH YOUR TEETH MORNING OF SURGERY AND RINSE YOUR MOUTH OUT, NO CHEWING GUM CANDY OR MINTS.     Take these medicines the morning of surgery with A SIP OF WATER: none                                You may not have any metal on your body including hair pins and              piercings  Do not wear jewelry, make-up, lotions, powders or perfumes, deodorant             Do not wear nail polish on your fingernails.  Do not shave  48 hours prior to surgery.                 Do not bring valuables to the hospital. [place].  Contacts, dentures or bridgework may not be worn into surgery.  Leave suitcase in the car. After surgery it may be brought to your room.     _____________________________________________________________________             [facility] - Preparing for Surgery Before surgery, you can play an important role.  Because skin is not sterile, your skin needs to be as free of germs as possible.  You can reduce the number of germs on your skin by washing with CHG (chlorahexidine gluconate) soap before  surgery.  CHG is an antiseptic cleaner which kills germs and bonds with the skin to continue killing germs even after washing. Please DO NOT use if you have an allergy to CHG or antibacterial soaps.  If your skin becomes reddened/irritated stop using the CHG and inform your nurse when you arrive at Short Stay. Do not shave (including legs and underarms) for at least 48 hours prior to the first CHG shower.    Please follow these instructions carefully:  1.  Shower with CHG Soap the night before surgery and the  morning of Surgery.  2.  If you choose to wash your hair, wash your hair first as usual with your  normal  shampoo.  3.  After you shampoo, rinse your hair and body thoroughly to remove the  shampoo.                            4.  Use CHG as you would any other liquid soap.  You can apply chg directly  to the skin and wash                       Gently with a scrungie or clean washcloth.  5.  Apply the CHG  Soap to your body ONLY FROM THE NECK DOWN.   Do not use on face/ open                           Wound or open sores. Avoid contact with eyes, ears mouth and genitals (private parts).                       Wash face,  Genitals (private parts) with your normal soap.             6.  Wash thoroughly, paying special attention to the area where your surgery  will be performed.  7.  Thoroughly rinse your body with warm water from the neck down.  8.  DO NOT shower/wash with your normal soap after using and rinsing off  the CHG Soap.                9.  Pat yourself dry with a clean towel.            10.  Wear clean pajamas.            11.  Place clean sheets on your bed the night of your first shower and do not  sleep with pets. Day of Surgery : Do not apply any lotions/deodorants the morning of surgery.  Please wear clean clothes to the hospital/surgery center.  FAILURE TO FOLLOW THESE INSTRUCTIONS MAY RESULT IN THE CANCELLATION OF YOUR SURGERY PATIENT  SIGNATURE_________________________________  NURSE SIGNATURE__________________________________  ________________________________________________________________________   Abigail Cruz  An incentive spirometer is a tool that can help keep your lungs clear and active. This tool measures how well you are filling your lungs with each breath. Taking long deep breaths may help reverse or decrease the chance of developing breathing (pulmonary) problems (especially infection) following: A long period of time when you are unable to move or be active. BEFORE THE PROCEDURE  If the spirometer includes an indicator to show your best effort, your nurse or respiratory therapist will set it to a desired goal. If possible, sit up straight or lean slightly forward. Try not to slouch. Hold the incentive spirometer in an upright position. INSTRUCTIONS FOR USE  Sit on the edge of your bed if possible, or sit up as far as you can in bed or on a chair. Hold the incentive spirometer in an upright position. Breathe out normally. Place the mouthpiece in your mouth and seal your lips tightly around it. Breathe in slowly and as deeply as possible, raising the piston or the ball toward the top of the column. Hold your breath for 3-5 seconds or for as long as possible. Allow the piston or ball to fall to the bottom of the column. Remove the mouthpiece from your mouth and breathe out normally. Rest for a few seconds and repeat Steps 1 through 7 at least 10 times every 1-2 hours when you are awake. Take your time and take a few normal breaths between deep breaths. The spirometer may include an indicator to show your best effort. Use the indicator as a goal to work toward during each repetition. After each set of 10 deep breaths, practice coughing to be sure your lungs are clear. If you have an incision (the cut made at the time of surgery), support your incision when coughing by placing a pillow or rolled up towels  firmly against it. Once you are able to get out of bed, walk  around indoors and cough well. You may stop using the incentive spirometer when instructed by your caregiver.  RISKS AND COMPLICATIONS Take your time so you do not get dizzy or light-headed. If you are in pain, you may need to take or ask for pain medication before doing incentive spirometry. It is harder to take a deep breath if you are having pain. AFTER USE Rest and breathe slowly and easily. It can be helpful to keep track of a log of your progress. Your caregiver can provide you with a simple table to help with this. If you are using the spirometer at home, follow these instructions: [place] IF:  You are having difficultly using the spirometer. You have trouble using the spirometer as often as instructed. Your pain medication is not giving enough relief while using the spirometer. You develop fever of 100.5 F (38.1 C) or higher. SEEK IMMEDIATE MEDICAL CARE IF:  You cough up bloody sputum that had not been present before. You develop fever of 102 F (38.9 C) or greater. You develop worsening pain at or near the incision site. MAKE SURE YOU:  Understand these instructions. Will watch your condition. Will get help right away if you are not doing well or get worse. Document Released: 05/16/2006 Document Revised: 03/28/2011 Document Reviewed: 07/17/2006 [facility] Patient Information 2014 [place], [place].   ________________________________________________________________________

## 2021-01-01 ENCOUNTER — Other Ambulatory Visit: Payer: Self-pay

## 2021-01-01 ENCOUNTER — Encounter (HOSPITAL_COMMUNITY)
Admission: RE | Admit: 2021-01-01 | Discharge: 2021-01-01 | Disposition: A | Discharge status: Home / Self Care | Payer: Commercial Managed Care - PPO | Source: Ambulatory Visit | Attending: General Surgery | Admitting: General Surgery

## 2021-01-01 ENCOUNTER — Encounter (HOSPITAL_COMMUNITY): Payer: Self-pay

## 2021-01-01 ENCOUNTER — Other Ambulatory Visit: Payer: Self-pay | Admitting: General Surgery

## 2021-01-01 DIAGNOSIS — Z20822 Contact with and (suspected) exposure to covid-19: Secondary | ICD-10-CM | POA: Insufficient documentation

## 2021-01-01 DIAGNOSIS — Z01818 Encounter for other preprocedural examination: Secondary | ICD-10-CM

## 2021-01-01 DIAGNOSIS — Z01812 Encounter for preprocedural laboratory examination: Secondary | ICD-10-CM | POA: Insufficient documentation

## 2021-01-01 HISTORY — DX: Personal history of urinary calculi: Z87.442

## 2021-01-01 HISTORY — DX: Unspecified osteoarthritis, unspecified site: M19.90

## 2021-01-01 HISTORY — DX: Essential (primary) hypertension: I10

## 2021-01-01 LAB — COMPREHENSIVE METABOLIC PANEL
ALT: 23 U/L (ref 0–44)
AST: 21 U/L (ref 15–41)
Albumin: 3.4 g/dL — ABNORMAL LOW (ref 3.5–5.0)
Alkaline Phosphatase: 65 U/L (ref 38–126)
Anion gap: 5 (ref 5–15)
BUN: 19 mg/dL (ref 6–20)
CO2: 27 mmol/L (ref 22–32)
Calcium: 8.6 mg/dL — ABNORMAL LOW (ref 8.9–10.3)
Chloride: 104 mmol/L (ref 98–111)
Creatinine, Ser: 0.63 mg/dL (ref 0.44–1.00)
GFR, Estimated: >60 mL/min (ref >60)
Glucose, Bld: 103 mg/dL — ABNORMAL HIGH (ref 70–99)
Potassium: 4 mmol/L (ref 3.5–5.1)
Sodium: 136 mmol/L (ref 135–145)
Total Bilirubin: 0.6 mg/dL (ref 0.3–1.2)
Total Protein: 6.5 g/dL (ref 6.5–8.1)

## 2021-01-01 LAB — CBC WITH DIFFERENTIAL/PLATELET
Abs Immature Granulocytes: 0.04 10*3/uL (ref 0.00–0.07)
Basophils Absolute: 0.1 10*3/uL (ref 0.0–0.1)
Basophils Relative: 1 %
Eosinophils Absolute: 0.4 10*3/uL (ref 0.0–0.5)
Eosinophils Relative: 4 %
HCT: 39.1 % (ref 36.0–46.0)
Hemoglobin: 12.7 g/dL (ref 12.0–15.0)
Immature Granulocytes: 1 %
Lymphocytes Relative: 19 %
Lymphs Abs: 1.5 10*3/uL (ref 0.7–4.0)
MCH: 28.4 pg (ref 26.0–34.0)
MCHC: 32.5 g/dL (ref 30.0–36.0)
MCV: 87.5 fL (ref 80.0–100.0)
Monocytes Absolute: 0.8 10*3/uL (ref 0.1–1.0)
Monocytes Relative: 9 %
Neutro Abs: 5.5 10*3/uL (ref 1.7–7.7)
Neutrophils Relative %: 66 %
Platelets: 254 10*3/uL (ref 150–400)
RBC: 4.47 MIL/uL (ref 3.87–5.11)
RDW: 14.6 % (ref 11.5–15.5)
WBC: 8.3 10*3/uL (ref 4.0–10.5)
nRBC: 0 % (ref 0.0–0.2)

## 2021-01-01 LAB — SARS CORONAVIRUS 2 (TAT 6-24 HRS): SARS Coronavirus 2: NEGATIVE

## 2021-01-01 NOTE — Progress Notes (Signed)
COVID test- 12/16   PCP - Abigail Reid PA Cardiologist - no  Chest x-ray - 07/31/20-epic EKG - 07/30/20-epic Stress Test - no ECHO - no Cardiac Cath - no Pacemaker/ICD device last checked:NA  Sleep Study - no CPAP -   Fasting Blood Sugar - NA Checks Blood Sugar _____ times a day  Blood Thinner Instructions:NA Aspirin Instructions: Last Dose:  Anesthesia review: yes  Patient denies shortness of breath, fever, cough and chest pain at PAT appointment Pt's BMI was 61.26. She gets SOB climbing stairs and sometimes doing housework but not with ADLs.  Patient verbalized understanding of instructions that were given to them at the PAT appointment. Patient was also instructed that they will need to review over the PAT instructions again at home before surgery. yes

## 2021-01-03 NOTE — Anesthesia Preprocedure Evaluation (Addendum)
Anesthesia Evaluation  Patient identified by MRN, date of birth, ID band Patient awake    Reviewed: Allergy & Precautions, NPO status , Patient's Chart, lab work & pertinent test results  Airway Mallampati: II  TM Distance: >3 FB Neck ROM: Full    Dental no notable dental hx. (+) Teeth Intact, Dental Advisory Given   Pulmonary COPD,  COPD inhaler,    Pulmonary exam normal breath sounds clear to auscultation       Cardiovascular hypertension, Normal cardiovascular exam Rhythm:Regular Rate:Normal     Neuro/Psych Anxiety negative neurological ROS     GI/Hepatic negative GI ROS, Neg liver ROS,   Endo/Other  negative endocrine ROSMorbid obesity (BMI 61.27)  Renal/GU negative Renal ROSLab Results      Component                Value               Date                      CREATININE               0.63                01/01/2021                K                        4.0                 01/01/2021                         Musculoskeletal  (+) Arthritis ,   Abdominal   Peds  Hematology Lab Results      Component                Value               Date                      WBC                      8.3                 01/01/2021                HGB                      12.7                01/01/2021                HCT                      39.1                01/01/2021                MCV                      87.5                01/01/2021                PLT  254                 01/01/2021              Anesthesia Other Findings POE:UMPNT  Reproductive/Obstetrics negative OB ROS                            Anesthesia Physical Anesthesia Plan  ASA: 3  Anesthesia Plan: General   Post-op Pain Management: Dilaudid IV, Ketamine IV and Tylenol PO (pre-op)   Induction: Intravenous  PONV Risk Score and Plan: 4 or greater and Treatment may vary due to age or medical condition,  Midazolam, Dexamethasone, Ondansetron and Scopolamine patch - Pre-op  Airway Management Planned: Video Laryngoscope Planned and Oral ETT  Additional Equipment: None  Intra-op Plan:   Post-operative Plan: Extubation in OR  Informed Consent: I have reviewed the patients History and Physical, chart, labs and discussed the procedure including the risks, benefits and alternatives for the proposed anesthesia with the patient or authorized representative who has indicated his/her understanding and acceptance.     Dental advisory given  Plan Discussed with: CRNA and Anesthesiologist  Anesthesia Plan Comments: (GA +lidocaine infusion + Ketamine)      Anesthesia Quick Evaluation

## 2021-01-04 ENCOUNTER — Encounter (HOSPITAL_COMMUNITY)
Admission: RE | Disposition: A | Discharge status: Home / Self Care | Payer: Self-pay | Source: Home / Self Care | Attending: General Surgery

## 2021-01-04 ENCOUNTER — Inpatient Hospital Stay (HOSPITAL_COMMUNITY): Payer: Commercial Managed Care - PPO | Admitting: Anesthesiology

## 2021-01-04 ENCOUNTER — Other Ambulatory Visit: Payer: Self-pay

## 2021-01-04 ENCOUNTER — Encounter (HOSPITAL_COMMUNITY): Payer: Self-pay | Admitting: General Surgery

## 2021-01-04 ENCOUNTER — Inpatient Hospital Stay (HOSPITAL_COMMUNITY)
Admission: RE | Admit: 2021-01-04 | Discharge: 2021-01-05 | DRG: 621 | Disposition: A | Discharge status: Home / Self Care | Payer: Commercial Managed Care - PPO | Attending: General Surgery | Admitting: General Surgery

## 2021-01-04 ENCOUNTER — Inpatient Hospital Stay (HOSPITAL_COMMUNITY): Payer: Commercial Managed Care - PPO | Admitting: Physician Assistant

## 2021-01-04 DIAGNOSIS — K760 Fatty (change of) liver, not elsewhere classified: Secondary | ICD-10-CM | POA: Diagnosis present

## 2021-01-04 DIAGNOSIS — M1990 Unspecified osteoarthritis, unspecified site: Secondary | ICD-10-CM | POA: Diagnosis present

## 2021-01-04 DIAGNOSIS — Z9884 Bariatric surgery status: Secondary | ICD-10-CM

## 2021-01-04 DIAGNOSIS — Z8249 Family history of ischemic heart disease and other diseases of the circulatory system: Secondary | ICD-10-CM | POA: Diagnosis not present

## 2021-01-04 DIAGNOSIS — Z79899 Other long term (current) drug therapy: Secondary | ICD-10-CM

## 2021-01-04 DIAGNOSIS — E781 Pure hyperglyceridemia: Secondary | ICD-10-CM | POA: Diagnosis present

## 2021-01-04 DIAGNOSIS — Z86718 Personal history of other venous thrombosis and embolism: Secondary | ICD-10-CM | POA: Diagnosis not present

## 2021-01-04 DIAGNOSIS — K449 Diaphragmatic hernia without obstruction or gangrene: Secondary | ICD-10-CM | POA: Diagnosis present

## 2021-01-04 DIAGNOSIS — Z6841 Body Mass Index (BMI) 40.0 and over, adult: Secondary | ICD-10-CM | POA: Diagnosis not present

## 2021-01-04 DIAGNOSIS — I10 Essential (primary) hypertension: Secondary | ICD-10-CM | POA: Diagnosis present

## 2021-01-04 DIAGNOSIS — Z833 Family history of diabetes mellitus: Secondary | ICD-10-CM

## 2021-01-04 DIAGNOSIS — Z01818 Encounter for other preprocedural examination: Secondary | ICD-10-CM

## 2021-01-04 HISTORY — PX: UPPER GI ENDOSCOPY: SHX6162

## 2021-01-04 HISTORY — PX: LAPAROSCOPIC GASTRIC SLEEVE RESECTION WITH HIATAL HERNIA REPAIR: SHX6512

## 2021-01-04 LAB — HEMOGLOBIN AND HEMATOCRIT, BLOOD
HCT: 40.4 % (ref 36.0–46.0)
Hemoglobin: 13.2 g/dL (ref 12.0–15.0)

## 2021-01-04 LAB — TYPE AND SCREEN
ABO/RH(D): O POS
Antibody Screen: NEGATIVE

## 2021-01-04 LAB — PREGNANCY, URINE: Preg Test, Ur: NEGATIVE

## 2021-01-04 LAB — ABO/RH: ABO/RH(D): O POS

## 2021-01-04 SURGERY — GASTRECTOMY, SLEEVE, LAPAROSCOPIC, WITH HIATAL HERNIA REPAIR
Anesthesia: General | Site: Abdomen

## 2021-01-04 MED ORDER — DIPHENHYDRAMINE HCL 50 MG/ML IJ SOLN: 12.5000 mg | Freq: Three times a day (TID) | INTRAMUSCULAR | Status: DC | PRN

## 2021-01-04 MED ORDER — CHLORHEXIDINE GLUCONATE 0.12 % MT SOLN
15.0000 mL | Freq: Once | OROMUCOSAL | Status: AC
  Administered 2021-01-04: 09:00:00 15 mL via OROMUCOSAL

## 2021-01-04 MED ORDER — FENTANYL CITRATE (PF) 100 MCG/2ML IJ SOLN
INTRAMUSCULAR | Status: AC
  Filled 2021-01-04: qty 2

## 2021-01-04 MED ORDER — ACETAMINOPHEN 10 MG/ML IV SOLN: 1000.0000 mg | Freq: Once | INTRAVENOUS | Status: DC | PRN

## 2021-01-04 MED ORDER — ENSURE MAX PROTEIN PO LIQD
2.0000 [oz_av] | ORAL | Status: DC
  Administered 2021-01-05 (×4): 2 [oz_av] via ORAL

## 2021-01-04 MED ORDER — PANTOPRAZOLE SODIUM 40 MG IV SOLR
40.0000 mg | Freq: Every day | INTRAVENOUS | Status: DC
  Administered 2021-01-04: 23:00:00 40 mg via INTRAVENOUS
  Filled 2021-01-04: qty 40

## 2021-01-04 MED ORDER — LACTATED RINGERS IV SOLN: INTRAVENOUS | Status: DC

## 2021-01-04 MED ORDER — SODIUM CHLORIDE 0.9 % IV SOLN
12.5000 mg | Freq: Four times a day (QID) | INTRAVENOUS | Status: DC | PRN
  Filled 2021-01-04: qty 0.5

## 2021-01-04 MED ORDER — DEXAMETHASONE SODIUM PHOSPHATE 10 MG/ML IJ SOLN
INTRAMUSCULAR | Status: DC | PRN
  Administered 2021-01-04: 10 mg via INTRAVENOUS

## 2021-01-04 MED ORDER — OXYCODONE HCL 5 MG PO TABS: 5.0000 mg | ORAL_TABLET | Freq: Once | ORAL | Status: DC | PRN

## 2021-01-04 MED ORDER — ENOXAPARIN (LOVENOX) PATIENT EDUCATION KIT
PACK | Freq: Once | Status: AC
Start: 2021-01-04 — End: 2021-01-04
  Filled 2021-01-04: qty 1

## 2021-01-04 MED ORDER — SODIUM CHLORIDE 0.9 % IV SOLN
2.0000 g | INTRAVENOUS | Status: AC
  Administered 2021-01-04: 10:00:00 2 g via INTRAVENOUS
  Filled 2021-01-04: qty 2

## 2021-01-04 MED ORDER — KCL IN DEXTROSE-NACL 20-5-0.45 MEQ/L-%-% IV SOLN
INTRAVENOUS | Status: DC
  Filled 2021-01-04 (×4): qty 1000

## 2021-01-04 MED ORDER — DEXAMETHASONE SODIUM PHOSPHATE 10 MG/ML IJ SOLN
INTRAMUSCULAR | Status: AC
  Filled 2021-01-04: qty 1

## 2021-01-04 MED ORDER — HYDROCHLOROTHIAZIDE 12.5 MG PO TABS
12.5000 mg | ORAL_TABLET | Freq: Every day | ORAL | Status: DC
  Administered 2021-01-05: 11:00:00 12.5 mg via ORAL
  Filled 2021-01-04: qty 1

## 2021-01-04 MED ORDER — ONDANSETRON HCL 4 MG/2ML IJ SOLN
4.0000 mg | Freq: Once | INTRAMUSCULAR | Status: AC | PRN
  Administered 2021-01-04: 12:00:00 4 mg via INTRAVENOUS

## 2021-01-04 MED ORDER — SCOPOLAMINE 1 MG/3DAYS TD PT72
1.0000 | MEDICATED_PATCH | TRANSDERMAL | Status: DC
  Administered 2021-01-04: 09:00:00 1.5 mg via TRANSDERMAL
  Filled 2021-01-04: qty 1

## 2021-01-04 MED ORDER — OXYCODONE HCL 5 MG/5ML PO SOLN
5.0000 mg | Freq: Four times a day (QID) | ORAL | Status: DC | PRN
  Administered 2021-01-04 – 2021-01-05 (×2): 5 mg via ORAL
  Filled 2021-01-04 (×2): qty 5

## 2021-01-04 MED ORDER — LIDOCAINE 2% (20 MG/ML) 5 ML SYRINGE
INTRAMUSCULAR | Status: DC | PRN
  Administered 2021-01-04: 100 mg via INTRAVENOUS

## 2021-01-04 MED ORDER — HYDROMORPHONE HCL 1 MG/ML IJ SOLN
0.2500 mg | INTRAMUSCULAR | Status: DC | PRN
  Administered 2021-01-04 (×2): 0.5 mg via INTRAVENOUS
  Administered 2021-01-04: 12:00:00 0.25 mg via INTRAVENOUS

## 2021-01-04 MED ORDER — LACTATED RINGERS IR SOLN
Status: DC | PRN
  Administered 2021-01-04: 150 mL

## 2021-01-04 MED ORDER — ONDANSETRON HCL 4 MG/2ML IJ SOLN
INTRAMUSCULAR | Status: AC
  Filled 2021-01-04: qty 2

## 2021-01-04 MED ORDER — PHENYLEPHRINE HCL (PRESSORS) 10 MG/ML IV SOLN
INTRAVENOUS | Status: AC
  Filled 2021-01-04: qty 1

## 2021-01-04 MED ORDER — MORPHINE SULFATE (PF) 2 MG/ML IV SOLN: 1.0000 mg | INTRAVENOUS | Status: DC | PRN

## 2021-01-04 MED ORDER — KETAMINE HCL 10 MG/ML IJ SOLN
INTRAMUSCULAR | Status: DC | PRN
  Administered 2021-01-04: 30 mg via INTRAVENOUS

## 2021-01-04 MED ORDER — ENOXAPARIN SODIUM 30 MG/0.3ML IJ SOSY
30.0000 mg | PREFILLED_SYRINGE | Freq: Two times a day (BID) | INTRAMUSCULAR | Status: DC
  Administered 2021-01-04 – 2021-01-05 (×2): 30 mg via SUBCUTANEOUS
  Filled 2021-01-04 (×2): qty 0.3

## 2021-01-04 MED ORDER — ACETAMINOPHEN 500 MG PO TABS
1000.0000 mg | ORAL_TABLET | ORAL | Status: AC
  Administered 2021-01-04: 09:00:00 1000 mg via ORAL
  Filled 2021-01-04: qty 2

## 2021-01-04 MED ORDER — BUPIVACAINE LIPOSOME 1.3 % IJ SUSP
INTRAMUSCULAR | Status: DC | PRN
  Administered 2021-01-04: 20 mL

## 2021-01-04 MED ORDER — ORAL CARE MOUTH RINSE: 15.0000 mL | Freq: Once | OROMUCOSAL | Status: AC

## 2021-01-04 MED ORDER — PHENYLEPHRINE HCL-NACL 20-0.9 MG/250ML-% IV SOLN
INTRAVENOUS | Status: DC | PRN
  Administered 2021-01-04: 35 ug/min via INTRAVENOUS

## 2021-01-04 MED ORDER — DEXAMETHASONE SODIUM PHOSPHATE 4 MG/ML IJ SOLN: 4.0000 mg | INTRAMUSCULAR | Status: DC

## 2021-01-04 MED ORDER — ROCURONIUM BROMIDE 10 MG/ML (PF) SYRINGE
PREFILLED_SYRINGE | INTRAVENOUS | Status: DC | PRN
  Administered 2021-01-04: 100 mg via INTRAVENOUS

## 2021-01-04 MED ORDER — CHLORHEXIDINE GLUCONATE 4 % EX LIQD: 60.0000 mL | Freq: Once | CUTANEOUS | Status: DC

## 2021-01-04 MED ORDER — OXYCODONE HCL 5 MG/5ML PO SOLN: 5.0000 mg | Freq: Once | ORAL | Status: DC | PRN

## 2021-01-04 MED ORDER — PHENYLEPHRINE 40 MCG/ML (10ML) SYRINGE FOR IV PUSH (FOR BLOOD PRESSURE SUPPORT)
PREFILLED_SYRINGE | INTRAVENOUS | Status: DC | PRN
  Administered 2021-01-04 (×3): 80 ug via INTRAVENOUS

## 2021-01-04 MED ORDER — AMISULPRIDE (ANTIEMETIC) 5 MG/2ML IV SOLN
INTRAVENOUS | Status: AC
  Filled 2021-01-04: qty 4

## 2021-01-04 MED ORDER — HYDRALAZINE HCL 20 MG/ML IJ SOLN: 10.0000 mg | INTRAMUSCULAR | Status: DC | PRN

## 2021-01-04 MED ORDER — KETAMINE HCL-SODIUM CHLORIDE 100-0.9 MG/10ML-% IV SOSY
PREFILLED_SYRINGE | INTRAVENOUS | Status: AC
  Filled 2021-01-04: qty 10

## 2021-01-04 MED ORDER — FENTANYL CITRATE (PF) 100 MCG/2ML IJ SOLN
INTRAMUSCULAR | Status: DC | PRN
  Administered 2021-01-04: 100 ug via INTRAVENOUS

## 2021-01-04 MED ORDER — BUPIVACAINE LIPOSOME 1.3 % IJ SUSP
INTRAMUSCULAR | Status: AC
  Filled 2021-01-04: qty 20

## 2021-01-04 MED ORDER — HEPARIN SODIUM (PORCINE) 5000 UNIT/ML IJ SOLN
5000.0000 [IU] | INTRAMUSCULAR | Status: AC
  Administered 2021-01-04: 09:00:00 5000 [IU] via SUBCUTANEOUS
  Filled 2021-01-04: qty 1

## 2021-01-04 MED ORDER — PROPOFOL 10 MG/ML IV BOLUS
INTRAVENOUS | Status: AC
  Filled 2021-01-04: qty 20

## 2021-01-04 MED ORDER — AMISULPRIDE (ANTIEMETIC) 5 MG/2ML IV SOLN
10.0000 mg | Freq: Once | INTRAVENOUS | Status: AC | PRN
  Administered 2021-01-04: 12:00:00 10 mg via INTRAVENOUS

## 2021-01-04 MED ORDER — APREPITANT 40 MG PO CAPS
40.0000 mg | ORAL_CAPSULE | ORAL | Status: AC
  Administered 2021-01-04: 09:00:00 40 mg via ORAL
  Filled 2021-01-04: qty 1

## 2021-01-04 MED ORDER — EPHEDRINE SULFATE-NACL 50-0.9 MG/10ML-% IV SOSY
PREFILLED_SYRINGE | INTRAVENOUS | Status: DC | PRN
  Administered 2021-01-04 (×2): 5 mg via INTRAVENOUS
  Administered 2021-01-04: 10 mg via INTRAVENOUS

## 2021-01-04 MED ORDER — ACETAMINOPHEN 160 MG/5ML PO SOLN: 1000.0000 mg | Freq: Three times a day (TID) | ORAL | Status: DC

## 2021-01-04 MED ORDER — VALSARTAN-HYDROCHLOROTHIAZIDE 80-12.5 MG PO TABS: 1.0000 | ORAL_TABLET | Freq: Every day | ORAL | Status: DC

## 2021-01-04 MED ORDER — MIDAZOLAM HCL 2 MG/2ML IJ SOLN
INTRAMUSCULAR | Status: AC
  Filled 2021-01-04: qty 2

## 2021-01-04 MED ORDER — IRBESARTAN 75 MG PO TABS
75.0000 mg | ORAL_TABLET | Freq: Every day | ORAL | Status: DC
  Administered 2021-01-05: 11:00:00 75 mg via ORAL
  Filled 2021-01-04: qty 1

## 2021-01-04 MED ORDER — LIDOCAINE 2% (20 MG/ML) 5 ML SYRINGE
INTRAMUSCULAR | Status: AC
  Filled 2021-01-04: qty 5

## 2021-01-04 MED ORDER — SIMETHICONE 80 MG PO CHEW
80.0000 mg | CHEWABLE_TABLET | Freq: Four times a day (QID) | ORAL | Status: DC | PRN
  Administered 2021-01-04: 13:00:00 80 mg via ORAL
  Filled 2021-01-04: qty 1

## 2021-01-04 MED ORDER — PROPOFOL 10 MG/ML IV BOLUS
INTRAVENOUS | Status: DC | PRN
  Administered 2021-01-04: 200 mg via INTRAVENOUS

## 2021-01-04 MED ORDER — BUPIVACAINE LIPOSOME 1.3 % IJ SUSP: 20.0000 mL | Freq: Once | INTRAMUSCULAR | Status: DC

## 2021-01-04 MED ORDER — SUGAMMADEX SODIUM 500 MG/5ML IV SOLN
INTRAVENOUS | Status: DC | PRN
  Administered 2021-01-04: 400 mg via INTRAVENOUS

## 2021-01-04 MED ORDER — HYDROMORPHONE HCL 1 MG/ML IJ SOLN
INTRAMUSCULAR | Status: AC
  Administered 2021-01-04: 12:00:00 0.25 mg via INTRAVENOUS
  Filled 2021-01-04: qty 2

## 2021-01-04 MED ORDER — ONDANSETRON HCL 4 MG/2ML IJ SOLN
4.0000 mg | Freq: Four times a day (QID) | INTRAMUSCULAR | Status: DC | PRN
  Administered 2021-01-04: 18:00:00 4 mg via INTRAVENOUS
  Filled 2021-01-04: qty 2

## 2021-01-04 MED ORDER — SODIUM CHLORIDE (PF) 0.9 % IJ SOLN
INTRAMUSCULAR | Status: DC | PRN
  Administered 2021-01-04: 50 mL

## 2021-01-04 MED ORDER — ROCURONIUM BROMIDE 10 MG/ML (PF) SYRINGE
PREFILLED_SYRINGE | INTRAVENOUS | Status: AC
  Filled 2021-01-04: qty 10

## 2021-01-04 MED ORDER — ACETAMINOPHEN 500 MG PO TABS
1000.0000 mg | ORAL_TABLET | Freq: Three times a day (TID) | ORAL | Status: DC
  Administered 2021-01-04 – 2021-01-05 (×3): 1000 mg via ORAL
  Filled 2021-01-04 (×3): qty 2

## 2021-01-04 MED ORDER — MIDAZOLAM HCL 5 MG/5ML IJ SOLN
INTRAMUSCULAR | Status: DC | PRN
  Administered 2021-01-04: 2 mg via INTRAVENOUS

## 2021-01-04 SURGICAL SUPPLY — 86 items
APPLICATOR COTTON TIP 6 STRL (MISCELLANEOUS) IMPLANT
APPLICATOR COTTON TIP 6IN STRL (MISCELLANEOUS)
APPLIER CLIP ROT 10 11.4 M/L (STAPLE)
APPLIER CLIP ROT 13.4 12 LRG (CLIP)
BAG COUNTER SPONGE SURGICOUNT (BAG) IMPLANT
BAG DECANTER FOR FLEXI CONT (MISCELLANEOUS) ×2 IMPLANT
BAG SURGICOUNT SPONGE COUNTING (BAG)
BENZOIN TINCTURE PRP APPL 2/3 (GAUZE/BANDAGES/DRESSINGS) ×1 IMPLANT
BLADE SURG SZ11 CARB STEEL (BLADE) ×3 IMPLANT
BNDG ADH 1X3 SHEER STRL LF (GAUZE/BANDAGES/DRESSINGS) ×6 IMPLANT
CABLE HIGH FREQUENCY MONO STRZ (ELECTRODE) ×1 IMPLANT
CHLORAPREP W/TINT 26 (MISCELLANEOUS) ×6 IMPLANT
CLIP APPLIE ROT 10 11.4 M/L (STAPLE) IMPLANT
CLIP APPLIE ROT 13.4 12 LRG (CLIP) IMPLANT
CLOSURE WOUND 1/2 X4 (GAUZE/BANDAGES/DRESSINGS)
COVER SURGICAL LIGHT HANDLE (MISCELLANEOUS) ×3 IMPLANT
DECANTER SPIKE VIAL GLASS SM (MISCELLANEOUS) ×1 IMPLANT
DERMABOND ADVANCED (GAUZE/BANDAGES/DRESSINGS) ×4
DERMABOND ADVANCED .7 DNX12 (GAUZE/BANDAGES/DRESSINGS) IMPLANT
DEVICE SUT QUICK LOAD TK 5 (STAPLE) IMPLANT
DEVICE SUT TI-KNOT TK 5X26 (MISCELLANEOUS) IMPLANT
DEVICE SUTURE ENDOST 10MM (ENDOMECHANICALS) IMPLANT
DEVICE TI KNOT TK5 (MISCELLANEOUS)
DISSECTOR BLUNT TIP ENDO 5MM (MISCELLANEOUS) IMPLANT
DRAPE UTILITY XL STRL (DRAPES) ×6 IMPLANT
ELECT L-HOOK LAP 45CM DISP (ELECTROSURGICAL)
ELECT REM PT RETURN 15FT ADLT (MISCELLANEOUS) ×3 IMPLANT
ELECTRODE L-HOOK LAP 45CM DISP (ELECTROSURGICAL) IMPLANT
GAUZE SPONGE 2X2 8PLY STRL LF (GAUZE/BANDAGES/DRESSINGS) IMPLANT
GAUZE SPONGE 4X4 12PLY STRL (GAUZE/BANDAGES/DRESSINGS) IMPLANT
GLOVE SRG 8 PF TXTR STRL LF DI (GLOVE) ×1 IMPLANT
GLOVE SURG POLY ORTHO LF SZ7.5 (GLOVE) ×1 IMPLANT
GLOVE SURG POLYISO LF SZ7.5 (GLOVE) ×2 IMPLANT
GLOVE SURG POLYISO LF SZ8 (GLOVE) ×2 IMPLANT
GLOVE SURG UNDER POLY LF SZ8 (GLOVE)
GOWN STRL REUS W/TWL XL LVL3 (GOWN DISPOSABLE) ×11 IMPLANT
GRASPER SUT TROCAR 14GX15 (MISCELLANEOUS) ×1 IMPLANT
IRRIG SUCT STRYKERFLOW 2 WTIP (MISCELLANEOUS) ×3
IRRIGATION SUCT STRKRFLW 2 WTP (MISCELLANEOUS) ×1 IMPLANT
KIT BASIN OR (CUSTOM PROCEDURE TRAY) ×3 IMPLANT
KIT TURNOVER KIT A (KITS) ×2 IMPLANT
MARKER SKIN DUAL TIP RULER LAB (MISCELLANEOUS) ×3 IMPLANT
MAT PREVALON FULL STRYKER (MISCELLANEOUS) ×3 IMPLANT
NDL SPNL 22GX3.5 QUINCKE BK (NEEDLE) ×1 IMPLANT
NEEDLE SPNL 22GX3.5 QUINCKE BK (NEEDLE) ×3 IMPLANT
PACK UNIVERSAL I (CUSTOM PROCEDURE TRAY) ×3 IMPLANT
PENCIL SMOKE EVACUATOR (MISCELLANEOUS) IMPLANT
QUICK LOAD TK 5 (STAPLE)
RELOAD STAPLE 60 3.6 BLU REG (STAPLE) ×1 IMPLANT
RELOAD STAPLE 60 3.8 GOLD REG (STAPLE) IMPLANT
RELOAD STAPLE 60 4.1 GRN THCK (STAPLE) ×1 IMPLANT
RELOAD STAPLE 60 BLK VRY/THCK (STAPLE) IMPLANT
RELOAD STAPLER 60MM BLK (STAPLE) IMPLANT
RELOAD STAPLER BLUE 60MM (STAPLE) ×4 IMPLANT
RELOAD STAPLER GOLD 60MM (STAPLE) ×1 IMPLANT
RELOAD STAPLER GREEN 60MM (STAPLE) ×1 IMPLANT
SCISSORS LAP 5X45 EPIX DISP (ENDOMECHANICALS) IMPLANT
SEALANT SURGICAL APPL DUAL CAN (MISCELLANEOUS) IMPLANT
SET TUBE SMOKE EVAC HIGH FLOW (TUBING) ×3 IMPLANT
SHEARS HARMONIC ACE PLUS 45CM (MISCELLANEOUS) ×3 IMPLANT
SLEEVE GASTRECTOMY 40FR VISIGI (MISCELLANEOUS) ×3 IMPLANT
SLEEVE XCEL OPT CAN 5 100 (ENDOMECHANICALS) ×9 IMPLANT
SOL ANTI FOG 6CC (MISCELLANEOUS) ×1 IMPLANT
SOLUTION ANTI FOG 6CC (MISCELLANEOUS) ×2
SPONGE GAUZE 2X2 STER 10/PKG (GAUZE/BANDAGES/DRESSINGS)
SPONGE T-LAP 18X18 ~~LOC~~+RFID (SPONGE) ×3 IMPLANT
STAPLE LINE REINFORCEMENT LAP (STAPLE) ×8 IMPLANT
STAPLER ECHELON BIOABSB 60 FLE (MISCELLANEOUS) ×4 IMPLANT
STAPLER ECHELON LONG 60 440 (INSTRUMENTS) ×3 IMPLANT
STAPLER RELOAD 60MM BLK (STAPLE)
STAPLER RELOAD BLUE 60MM (STAPLE) ×12
STAPLER RELOAD GOLD 60MM (STAPLE) ×3
STAPLER RELOAD GREEN 60MM (STAPLE) ×3
STRIP CLOSURE SKIN 1/2X4 (GAUZE/BANDAGES/DRESSINGS) ×1 IMPLANT
SUT MNCRL AB 4-0 PS2 18 (SUTURE) ×3 IMPLANT
SUT SURGIDAC NAB ES-9 0 48 120 (SUTURE) IMPLANT
SUT VICRYL 0 TIES 12 18 (SUTURE) ×3 IMPLANT
SYR 20ML LL LF (SYRINGE) ×3 IMPLANT
SYR 50ML LL SCALE MARK (SYRINGE) ×3 IMPLANT
TOWEL OR 17X26 10 PK STRL BLUE (TOWEL DISPOSABLE) ×3 IMPLANT
TOWEL OR NON WOVEN STRL DISP B (DISPOSABLE) ×3 IMPLANT
TROCAR BLADELESS 15MM (ENDOMECHANICALS) ×3 IMPLANT
TROCAR BLADELESS OPT 5 100 (ENDOMECHANICALS) ×3 IMPLANT
TUBING CONNECTING 10 (TUBING) ×4 IMPLANT
TUBING CONNECTING 10' (TUBING) ×2
TUBING ENDO SMARTCAP (MISCELLANEOUS) ×3 IMPLANT

## 2021-01-04 NOTE — Anesthesia Procedure Notes (Signed)
Procedure Name: Intubation Date/Time: 01/04/2021 9:50 AM Performed by: Gerald Leitz, CRNA Pre-anesthesia Checklist: Patient identified, Patient being monitored, Timeout performed, Emergency Drugs available and Suction available Patient Re-evaluated:Patient Re-evaluated prior to induction Oxygen Delivery Method: Circle system utilized Preoxygenation: Pre-oxygenation with 100% oxygen Induction Type: IV induction Ventilation: Mask ventilation without difficulty Laryngoscope Size: Mac and 3 Grade View: Grade I Tube type: Oral Tube size: 7.5 mm Number of attempts: 1 Airway Equipment and Method: Stylet Placement Confirmation: ETT inserted through vocal cords under direct vision, positive ETCO2 and breath sounds checked- equal and bilateral Secured at: 23 cm Tube secured with: Tape Dental Injury: Teeth and Oropharynx as per pre-operative assessment

## 2021-01-04 NOTE — Interval H&P Note (Signed)
History and Physical Interval Note:  01/04/2021 8:56 AM  Abigail Cruz  has presented today for surgery, with the diagnosis of MORBID OBESITY DUE TO EXCESS CALORIES.  The various methods of treatment have been discussed with the patient and family. After consideration of risks, benefits and other options for treatment, the patient has consented to  Procedure(s): LAPAROSCOPIC GASTRIC SLEEVE RESECTION WITH HIATAL HERNIA REPAIR (N/A) UPPER GI ENDOSCOPY (N/A) as a surgical intervention.  The patient's history has been reviewed, patient examined, no change in status, stable for surgery.  I have reviewed the patient's chart and labs.  Questions were answered to the patient's satisfaction.     Greer Pickerel

## 2021-01-04 NOTE — Progress Notes (Signed)
PHARMACY CONSULT FOR:  Risk Assessment for Post-Discharge VTE Following Bariatric Surgery  Post-Discharge VTE Risk Assessment: This patient's probability of 30-day post-discharge VTE is increased due to the factors marked:   Female    Age >/=60 years  X  BMI >/=50 kg/m2    CHF    Dyspnea at Rest    Paraplegia   X Non-gastric-band surgery    Operation Time >/=3 hr    Return to OR     Length of Stay >/= 3 d   Hx of VTE   Hypercoagulable condition   Significant venous stasis    Predicted probability of 30-day post-discharge VTE: 0.27%, Mild  Other patient-specific factors to consider: remote history of DVT while on birth control   Pharmacy recommendation for Discharge: No pharmacologic prophylaxis post-discharge Noted Dr Dois Davenport plan for Lovenox for 1 month to mitigate her risk for postoperative thrombosis.    Abigail Cruz is a 55 y.o. female who underwent laparoscopic sleeve gastrectomy on 01/04/2021    Case start: 1018 Case end: 1128   Allergies  Allergen Reactions   Bee Venom Anaphylaxis   Iodine Dermatitis and Rash   Other Anaphylaxis    ANT bites   Latex Other (See Comments)    blisters   Tape     Adhesives blisters   Shellfish-Derived Products Palpitations and Rash    Patient Measurements: Weight: (!) 151.4 kg (333 lb 12.8 oz) Body mass index is 61.05 kg/m.  No results for input(s): WBC, HGB, HCT, PLT, APTT, CREATININE, LABCREA, CREATININE, CREAT24HRUR, MG, PHOS, ALBUMIN, PROT, ALBUMIN, AST, ALT, ALKPHOS, BILITOT, BILIDIR, IBILI in the last 72 hours. Estimated Creatinine Clearance: 116.3 mL/min (by C-G formula based on SCr of 0.63 mg/dL).    Past Medical History:  Diagnosis Date   Abnormal Pap smear of cervix    in her 20's   Arthritis    hands   Asthma    triggered by smoke   History of kidney stones    Hypertension    Pneumonia      Medications Prior to Admission  Medication Sig Dispense Refill Last Dose   acetaminophen (TYLENOL) 650  MG CR tablet Take 650 mg by mouth every 8 (eight) hours as needed for pain.   01/03/2021   fexofenadine (ALLEGRA) 180 MG tablet Take 180 mg by mouth daily as needed for allergies.   01/03/2021   fluticasone (FLONASE) 50 MCG/ACT nasal spray 1 spray each nostril following sinus rinses twice daily (Patient taking differently: Place 1 spray into both nostrils daily as needed (allergies.).) 16 g 11    valsartan-hydrochlorothiazide (DIOVAN-HCT) 80-12.5 MG tablet TAKE 1 TABLET DAILY 90 tablet 0 01/03/2021   Vitamin D, Ergocalciferol, (DRISDOL) 1.25 MG (50000 UNIT) CAPS capsule Take 1 capsule by mouth two times a week. (Patient taking differently: Take 50,000 Units by mouth 2 (two) times a week. Wednesdays & Sundays) 24 capsule 1 01/03/2021   ibuprofen (ADVIL) 200 MG tablet Take 400 mg by mouth every 6 (six) hours as needed for moderate pain or headache.   More than a month   PEG-KCl-NaCl-NaSulf-Na Asc-C (PLENVU) 140 g SOLR Use as directed. Manufacturer's coupon Universal coupon code:BIN: P2366821; GROUP: [id number]; PCNPryor Curia; ID: [id number]; PAY NO MORE $50; NO prior authorization 1 each 0     Gretta Arab PharmD, BCPS Clinical Pharmacist [facility] main pharmacy [phone] 01/04/2021 12:55 PM

## 2021-01-04 NOTE — Anesthesia Postprocedure Evaluation (Signed)
Anesthesia Post Note  Patient: Denice Bors  Procedure(s) Performed: LAPAROSCOPIC GASTRIC SLEEVE RESECTION (Abdomen) UPPER GI ENDOSCOPY (Abdomen)     Patient location during evaluation: PACU Anesthesia Type: General Level of consciousness: awake and alert Pain management: pain level controlled Vital Signs Assessment: post-procedure vital signs reviewed and stable Respiratory status: spontaneous breathing, nonlabored ventilation, respiratory function stable and patient connected to nasal cannula oxygen Cardiovascular status: blood pressure returned to baseline and stable Postop Assessment: no apparent nausea or vomiting Anesthetic complications: no   No notable events documented.  Last Vitals:  Vitals:   01/04/21 1242 01/04/21 1300  BP: (!) 166/87 (!) 164/77  Pulse: 60 62  Resp: 12 18  Temp:  36.9 C  SpO2: 95% 95%    Last Pain:  Vitals:   01/04/21 1300  TempSrc: Oral  PainSc:                  Barnet Glasgow

## 2021-01-04 NOTE — H&P (Signed)
[place]   Abigail Cruz is a 55 y.o. female who is seen today for long-term follow-up regarding her obesity.  She denies any medical changes since I last saw her.  I initially met her in June of this year to discuss bariatric surgery. Despite numerous attempts for sustained weight loss she has been unsuccessful. Her comorbidities included hypertension, remote history of DVT while on birth control and chronic bilateral knee pain. She has completed the bariatric surgery evaluation process. She has received nutritional and psychological clearance. Her sleep apnea questionnaire was low risk. She has had a prior laparoscopic cholecystectomy and tubal ligation. She had had a full panel of labs including CBC, comprehensive metabolic panel, TSH and K8J in March which I reviewed. Her triglycerides are borderline at 150 otherwise normal.  Her upper GI showed a small hiatal hernia and a small Slutsky's ring without narrowing. She had an normal mammogram as well. She also had a colonoscopy in October which I reviewed as well. She had a 30 mm polyp which was removed which came back as a tubular adenoma. Recall in 3 years. She denies any GERD or heartburn. She denies any blood thinner. She denies any tobacco use  No chest pain, chest pressure, shortness of breath, dyspnea on exertion,  Review of Systems: A complete review of systems was obtained from the patient. I have reviewed this information and discussed as appropriate with the patient. See HPI as well for other ROS.  ROS   Medical History: Past Medical History:  Diagnosis Date   Arthritis   Hypertension   There is no problem list on file for this patient.  Past Surgical History:  Procedure Laterality Date   LAPAROSCOPIC CHOLECYSTECTOMY   LAPAROSCOPIC TUBAL LIGATION    Allergies  Allergen Reactions   Iodine Dermatitis and Rash   Venom-Honey Bee Anaphylaxis   Adhesive Tape-Silicones Other (See Comments)  Adhesives blisters    Latex Other (See Comments)  blisters   Shellfish Containing Products Palpitations and Rash   Current Outpatient Medications on File Prior to Visit  Medication Sig Dispense Refill   cholecalciferol, vitamin D3, (VITAMIN D3) 125 mcg (5,000 unit) tablet Take by mouth   ergocalciferol, vitamin D2, 1,250 mcg (50,000 unit) capsule   valsartan-hydrochlorothiazide (DIOVAN-HCT) 80-12.5 mg tablet   No current facility-administered medications on file prior to visit.   Family History  Problem Relation Age of Onset   Obesity Mother   High blood pressure (Hypertension) Mother   Diabetes Mother   High blood pressure (Hypertension) Father   High blood pressure (Hypertension) Brother   Diabetes Brother    Social History   Tobacco Use  Smoking Status Never  Smokeless Tobacco Never    Social History   Socioeconomic History   Marital status: Married  Tobacco Use   Smoking status: Never   Smokeless tobacco: Never  Substance and Sexual Activity   Alcohol use: Yes  Comment: 8/10 drinks per year   Drug use: Never   Objective:   Vitals:  12/16/20 1126  BP: (!) 140/60  Pulse: 104  Temp: 36.8 C (98.2 F)  SpO2: 98%  Weight: (!) 153 kg (337 lb 6.4 oz)  Height: 157.5 cm (5' 2")   Body mass index is 61.71 kg/m.  Gen: alert, NAD, non-toxic appearing, severe obesity  Pupils: equal, no scleral icterus Pulm: Lungs clear to auscultation, symmetric chest rise CV: regular rate and rhythm Abd: soft, nontender, nondistended. old trocar sites. No cellulitis. No incisional hernia Ext: no  edema,  Skin: no rash, no jaundice  Labs, Imaging and Diagnostic Testing:  Reviewed labs and colonoscopy, upper endoscopy chest x-ray and EKG as documented above  Assessment and Plan:  Diagnoses and all orders for this visit:  Severe obesity (CMS-HCC)  Hiatal hernia  Hypertension, essential  History of DVT of lower extremity    She has completed her bariatric surgery evaluation process.  She has received nutritional and psychological clearance. When we initially met we discussed both sleeve gastrectomy and gastric bypass. She is now interested in sleeve gastrectomy.  The patient meets weight loss surgery criteria. I think the patient would be an acceptable candidate for Laparoscopic vertical sleeve gastrectomy.   We rediscussed LSG. We rediscussed the preoperative, operative and postoperative process. Using diagrams, I explained the surgery in detail including the performance of an EGD near the end of the surgery. We discussed the typical hospital course including a 1-2 night stay baring any complications. The patient was given educational material. I quoted the patient that most patients can lose up to 50-55% of their excess weight. We did discuss the possibility of weight regain several years after the procedure.  The risks of infection, bleeding, pain, scarring, weight regain, too little or too much weight loss, vitamin deficiencies and need for lifelong vitamin supplementation, hair loss, need for protein supplementation, leaks, stricture, reflux, food intolerance, gallstone formation, hernia, need for reoperation, need for open surgery, injury to spleen or surrounding structures, DVT's, PE, and death again rediscussed with the patient and the patient expressed understanding and desires to proceed with laparoscopic vertical sleeve gastrectomy, possible open, intraoperative endoscopy.  We rediscussed that before and after surgery that there would be an alteration in their diet. I explained that we have put them on a diet 2 weeks before surgery. I also explained that they would be on a liquid diet for 2 weeks after surgery. We discussed that they would have to avoid certain foods after surgery. We discussed the importance of physical activity as well as compliance with our dietary and supplement recommendations and routine follow-up.  We also discussed the findings of a small hiatal  hernia on her imaging. I discussed hiatal hernia. I discussed that we would try her best to technically repair it during surgery but given her current obesity it may be technically challenging. We discussed that she would be at increased risk for recurrence. It appears to be a small sliding hiatal hernia.  We rediscussed that she is at increased risk for postoperative blood clots due to her her remote history of DVT. I explained that we would send her out on Lovenox for 1 month to mitigate her risk for postoperative thrombosis.   Leighton Ruff. Redmond Pulling, MD, FACS General, Bariatric, & Minimally Invasive Surgery [facility], [place]

## 2021-01-04 NOTE — Op Note (Signed)
01/04/2021 MA MUNOZ 15-May-1965 [id number]   PRE-OPERATIVE DIAGNOSIS:   Severe obesity (BMI 61)  Elevated triglyceride  Hypertension, essential  History of DVT of lower extremity  POST-OPERATIVE DIAGNOSIS:  same + hepatic steatosis  PROCEDURE:  Procedure(s): LAPAROSCOPIC SLEEVE GASTRECTOMY UPPER GI ENDOSCOPY  SURGEON:  Surgeon(s): Gayland Curry, MD FACS FASMBS  ASSISTANTS: Johnathan Hausen MD FACS  ANESTHESIA:   general  DRAINS: none   BOUGIE: 40 fr ViSiGi  LOCAL MEDICATIONS USED:   Exparel  EBL: minimal  SPECIMEN:  Source of Specimen:  Greater curvature of stomach  DISPOSITION OF SPECIMEN:  PATHOLOGY  COUNTS:  YES  INDICATION FOR PROCEDURE: This is a very pleasant 55 y.o.-year-old morbidly obese female who has had unsuccessful attempts for sustained weight loss. The patient presents today for a planned laparoscopic sleeve gastrectomy with upper endoscopy. We have discussed the risk and benefits of the procedure extensively preoperatively. Please see my separate notes.  PROCEDURE: After obtaining informed consent and receiving 5000 units of subcutaneous heparin, the patient was brought to the operating room at [facility] and placed supine on the operating room table. General endotracheal anesthesia was established. Sequential compression devices were placed. A orogastric tube was placed. The patient's abdomen was prepped and draped in the usual standard surgical fashion. The patient received preoperative IV antibiotic. A surgical timeout was performed. ERAS protocol used.   Access to the abdomen was achieved using a 5 mm 0 laparoscope thru a 5 mm trocar In the left upper Quadrant 2 fingerbreadths below the left subcostal margin using the Optiview technique. Pneumoperitoneum was smoothly established up to 15 mm of mercury. The laparoscope was advanced and the abdominal cavity was surveilled. The patient was then placed in reverse Trendelenburg.   A 5 mm  trocar was placed slightly above and to the left of the umbilicus under direct visualization.  The [facility] liver retractor was placed under the left lobe of the liver through a 5 mm trocar incision site in the subxiphoid position. A 5 mm trocar was placed in the lateral right upper quadrant along with a 15 mm trocar in the mid right abdomen. A final 5 mm trocar was placed in the lateral LUQ.  All under direct visualization after exparel had been infiltrated in bilateral lateral upper abdominal walls as a TAP block.  The stomach was inspected. It was completely decompressed and the orogastric tube was removed.  Patient had a very high rib cage.  She had a very fatty liver.  Even with the [facility] liver retractor I really cannot see the anterior diaphragm.  It was very tight field-of-view at the diaphragm.  I did not see any overt small hiatal hernia.  Given the limited visualization in that area I did not feel comfortable dissecting out and looking for a hiatal hernia.    We identified the pylorus and measured 6 cm proximal to the pylorus and identified an area of where we would start taking down the short gastric vessels. Harmonic scalpel was used to take down the short gastric vessels along the greater curvature of the stomach. We were able to enter the lesser sac. We continued to march along the greater curvature of the stomach taking down the short gastrics. As we approached the gastrosplenic ligament we took care in this area not to injure the spleen. We were able to take down the entire gastrosplenic ligament. We then mobilized the fundus away from the left crus of diaphragm. There were not any significant posterior  gastric avascular attachments. This left the stomach completely mobilized. No vessels had been taken down along the lesser curvature of the stomach.  We then reidentified the pylorus. A 40Fr ViSiGi was then placed in the oropharynx and advanced down into the stomach and placed in the  distal antrum and positioned along the lesser curvature. It was placed under suction which secured the 40Fr ViSiGi in place along the lesser curve. Then using the Ethicon echelon 60 mm stapler with a green load with Ethicon staple line reinforcement, I placed a stapler along the antrum approximately 5 cm from the pylorus. The stapler was angled so that there is ample room at the angularis incisura. I then fired the first staple load after inspecting it posteriorly to ensure adequate space both anteriorly and posteriorly.  At this point I started using 60 mm gold load staple cartridge with Ethicon staple line reinforcement. The echelon stapler was then repositioned with a 60 mm blue load with Ethicon staple line reinforcement and we continued to march up along the [place]. My assistant was holding traction along the greater curvature stomach along the cauterized short gastric vessels ensuring that the stomach was symmetrically retracted. Prior to each firing of the staple, we rotated the stomach to ensure that there is adequate stomach left.  As we approached the fundus, I used 60 mm blue cartridge with Seamguard aiming  lateral to the GE junction after mobilizing some of the esophageal fat pad.  The sleeve was inspected. There is no evidence of cork screw. The staple line appeared hemostatic. The CRNA inflated the ViSiGi to the green zone and the upper abdomen was flooded with saline. There were no bubbles. The sleeve was decompressed and the ViSiGi removed. My assistant scrubbed out and performed an upper endoscopy. The sleeve easily distended with air and the scope was easily advanced to the pylorus. There is no evidence of internal bleeding or cork screwing. There was no narrowing at the angularis. There is no evidence of bubbles. Please see his operative note for further details. The gastric sleeve was decompressed and the endoscope was removed.  The greater curvature the stomach was grasped with a laparoscopic  grasper and removed from the 15 mm trocar site.  The liver retractor was removed. I then closed the 15 mm trocar site with 1 interrupted 0 Vicryl sutures through the fascia using the endoclose. The closure was viewed laparoscopically and it was airtight. Remaining Exparel was then infiltrated in the preperitoneal spaces around the trocar sites. Pneumoperitoneum was released. All trocar sites were closed with a 4-0 Monocryl in a subcuticular fashion followed by the application of dermabond. The patient was extubated and taken to the recovery room in stable condition. All needle, instrument, and sponge counts were correct x2. There are no immediate complications  (1) 60 mm green with Ethicon staple line reinforcement (1) 60 mm gold with Ethicon staple line reinforcement (4) 60 mm blue with Ethicon staple line reinforcement  PLAN OF CARE: Admit to inpatient   PATIENT DISPOSITION:  PACU - hemodynamically stable.   Delay start of Pharmacological VTE agent (>24hrs) due to surgical blood loss or risk of bleeding:  no  Leighton Ruff. Redmond Pulling, MD, FACS FASMBS General, Bariatric, & Minimally Invasive Surgery [facility], [place]

## 2021-01-04 NOTE — Discharge Instructions (Signed)

## 2021-01-04 NOTE — Transfer of Care (Signed)
Immediate Anesthesia Transfer of Care Note  Patient: Abigail Cruz  Procedure(s) Performed: Procedure(s): LAPAROSCOPIC GASTRIC SLEEVE RESECTION (N/A) UPPER GI ENDOSCOPY (N/A)  Patient Location: PACU  Anesthesia Type:General  Level of Consciousness: Alert, Awake, Oriented  Airway & Oxygen Therapy: Patient Spontanous Breathing  Post-op Assessment: Report given to RN  Post vital signs: Reviewed and stable  Last Vitals:  Vitals:   01/04/21 0904  BP: (!) 173/98  Pulse: 78  Resp: 16  Temp: 36.7 C  SpO2: 85%    Complications: No apparent anesthesia complications

## 2021-01-04 NOTE — Progress Notes (Signed)

## 2021-01-05 ENCOUNTER — Encounter (HOSPITAL_COMMUNITY): Payer: Self-pay | Admitting: General Surgery

## 2021-01-05 LAB — CBC WITH DIFFERENTIAL/PLATELET
Abs Immature Granulocytes: 0.05 10*3/uL (ref 0.00–0.07)
Basophils Absolute: 0 10*3/uL (ref 0.0–0.1)
Basophils Relative: 0 %
Eosinophils Absolute: 0 10*3/uL (ref 0.0–0.5)
Eosinophils Relative: 0 %
HCT: 36.9 % (ref 36.0–46.0)
Hemoglobin: 12.2 g/dL (ref 12.0–15.0)
Immature Granulocytes: 1 %
Lymphocytes Relative: 13 %
Lymphs Abs: 1.3 10*3/uL (ref 0.7–4.0)
MCH: 28.8 pg (ref 26.0–34.0)
MCHC: 33.1 g/dL (ref 30.0–36.0)
MCV: 87 fL (ref 80.0–100.0)
Monocytes Absolute: 0.9 10*3/uL (ref 0.1–1.0)
Monocytes Relative: 9 %
Neutro Abs: 7.8 10*3/uL — ABNORMAL HIGH (ref 1.7–7.7)
Neutrophils Relative %: 77 %
Platelets: 261 10*3/uL (ref 150–400)
RBC: 4.24 MIL/uL (ref 3.87–5.11)
RDW: 14.6 % (ref 11.5–15.5)
WBC: 10.1 10*3/uL (ref 4.0–10.5)
nRBC: 0 % (ref 0.0–0.2)

## 2021-01-05 LAB — COMPREHENSIVE METABOLIC PANEL
ALT: 85 U/L — ABNORMAL HIGH (ref 0–44)
AST: 65 U/L — ABNORMAL HIGH (ref 15–41)
Albumin: 3.5 g/dL (ref 3.5–5.0)
Alkaline Phosphatase: 57 U/L (ref 38–126)
Anion gap: 7 (ref 5–15)
BUN: 8 mg/dL (ref 6–20)
CO2: 25 mmol/L (ref 22–32)
Calcium: 8.5 mg/dL — ABNORMAL LOW (ref 8.9–10.3)
Chloride: 105 mmol/L (ref 98–111)
Creatinine, Ser: 0.67 mg/dL (ref 0.44–1.00)
GFR, Estimated: >60 mL/min (ref >60)
Glucose, Bld: 137 mg/dL — ABNORMAL HIGH (ref 70–99)
Potassium: 4.2 mmol/L (ref 3.5–5.1)
Sodium: 137 mmol/L (ref 135–145)
Total Bilirubin: 0.8 mg/dL (ref 0.3–1.2)
Total Protein: 6.5 g/dL (ref 6.5–8.1)

## 2021-01-05 MED ORDER — ACETAMINOPHEN 500 MG PO TABS: 1000.0000 mg | ORAL_TABLET | Freq: Three times a day (TID) | ORAL | 0 refills | Status: AC

## 2021-01-05 MED ORDER — ENOXAPARIN SODIUM 40 MG/0.4ML IJ SOSY: 40.0000 mg | PREFILLED_SYRINGE | Freq: Two times a day (BID) | INTRAMUSCULAR | 0 refills | Status: DC

## 2021-01-05 MED ORDER — TRAMADOL HCL 50 MG PO TABS: 50.0000 mg | ORAL_TABLET | Freq: Four times a day (QID) | ORAL | 0 refills | Status: DC | PRN

## 2021-01-05 MED ORDER — PANTOPRAZOLE SODIUM 40 MG PO TBEC: 40.0000 mg | DELAYED_RELEASE_TABLET | Freq: Every day | ORAL | 0 refills | Status: DC

## 2021-01-05 MED ORDER — ONDANSETRON 4 MG PO TBDP: 4.0000 mg | ORAL_TABLET | Freq: Four times a day (QID) | ORAL | 0 refills | Status: DC | PRN

## 2021-01-05 NOTE — Progress Notes (Signed)
Patient was given discharge instructions, and all questions were answered.  Patient was stable for discharge and was taken to the main exit by wheelchair. 

## 2021-01-05 NOTE — Progress Notes (Signed)
Patient alert and oriented, pain is controlled. Patient is tolerating fluids, advanced to protein shake today, patient is tolerating well.  Reviewed Gastric sleeve discharge instructions with patient and patient is able to articulate understanding.  Provided information on BELT program, Support Group and [facility] outpatient pharmacy. Reviewed Lovenox teaching kit and pt reviewed Lovenox "Patient-Self Injection Video".  All questions answered, will continue to monitor.  

## 2021-01-05 NOTE — Progress Notes (Signed)
Transition of Care [facility]) Screening Note  Patient Details  Name: Abigail Cruz Date of Birth: [birth year]  Transition of Care [facility]) CM/SW Contact:    Sherie Don, LCSW Phone Number: 01/05/2021, 10:51 AM  Transition of Care Department [facility]) has reviewed patient and no TOC needs have been identified at this time. We will continue to monitor patient advancement through interdisciplinary progression rounds. If new patient transition needs arise, please place a TOC consult.

## 2021-01-05 NOTE — Discharge Summary (Signed)
Physician Discharge Summary  Abigail CURVIN Cruz:[id number] DOB: 12-12-1965 DOA: 01/04/2021  PCP: Abigail Reid, PA-C  Admit date: 01/04/2021 Discharge date: 01/05/2021  Recommendations for Outpatient Follow-up:    Follow-up Information     Greer Pickerel, MD. Go on 01/28/2021.   Specialty: General Surgery Why: at 4pm.  Please arrive 15 minutes prior to your appointment time.  Thank you. Contact information: [address] [place] [address] [phone]         Carlena Hurl, PA-C. Go on 03/02/2021.   Specialty: General Surgery Why: at 2:15pm for Dr. Redmond Pulling.  Please arrive 15 minutes prior to your appointment time.  Thank you. Contact information: [address] [place] [address] [phone]                Discharge Diagnoses:  Principal Problem:   S/P laparoscopic sleeve gastrectomy   Surgical Procedure: Laparoscopic Sleeve Gastrectomy, upper endoscopy  Discharge Condition: Good Disposition: Home  Diet recommendation: Postoperative sleeve gastrectomy diet (liquids only)  Filed Weights   01/04/21 0908  Weight: (!) 151.4 kg     Hospital Course:  The patient was admitted for a planned laparoscopic sleeve gastrectomy. Please see operative note. Preoperatively the patient was given 5000 units of subcutaneous heparin for DVT prophylaxis. Postoperative prophylactic Lovenox dosing was started on the evening of postoperative day 0. ERAS protocol was used. On the evening of postoperative day 0, the patient was started on water and ice chips. On postoperative day 1 the patient had no fever or tachycardia and was tolerating water in their diet was gradually advanced throughout the day. The patient was ambulating without difficulty. Their vital signs are stable without fever or tachycardia. Their hemoglobin had remained stable.  The patient had received discharge instructions and counseling. 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  She received Lovenox instruction teaching  BP (!) 151/76 (BP Location: Right Arm)    Pulse 69    Temp 98.4 F (36.9 C) (Oral)    Resp 18    Wt (!) 151.4 kg    LMP 12/28/2020    SpO2 96%    BMI 61.05 kg/m   Gen: alert, NAD, non-toxic appearing Pupils: equal, no scleral icterus Pulm: Lungs clear to auscultation, symmetric chest rise CV: regular rate and rhythm Abd: soft, mild approp tender, nondistended. Some irritation around trocar sites. No cellulitis. No incisional hernia Ext: no edema, no calf tenderness Skin: no rash, no jaundice  Discharge Instructions  Discharge Instructions     Ambulate hourly while awake   Complete by: As directed    Call MD for:  difficulty breathing, headache or visual disturbances   Complete by: As directed    Call MD for:  persistant dizziness or light-headedness   Complete by: As directed    Call MD for:  persistant nausea and vomiting   Complete by: As directed    Call MD for:  redness, tenderness, or signs of infection (pain, swelling, redness, odor or green/yellow discharge around incision site)   Complete by: As directed    Call MD for:  severe uncontrolled pain   Complete by: As directed    Call MD for:  temperature >101 F   Complete by: As directed    Diet bariatric full liquid   Complete by: As directed  Discharge instructions   Complete by: As directed    See bariatric discharge instructions   Incentive spirometry   Complete by: As directed    Perform hourly while awake      Allergies as of 01/05/2021       Reactions   Bee Venom Anaphylaxis   Iodine Dermatitis, Rash   Other Anaphylaxis   ANT bites   Latex Other (See Comments)   blisters   Tape    Adhesives blisters   Shellfish-derived Products Palpitations, Rash        Medication List     STOP taking these  medications    acetaminophen 650 MG CR tablet Commonly known as: TYLENOL Replaced by: acetaminophen 500 MG tablet   ibuprofen 200 MG tablet Commonly known as: ADVIL   Plenvu 140 g Solr Generic drug: PEG-KCl-NaCl-NaSulf-Na Asc-C       TAKE these medications    acetaminophen 500 MG tablet Commonly known as: TYLENOL Take 2 tablets (1,000 mg total) by mouth every 8 (eight) hours for 5 days. Replaces: acetaminophen 650 MG CR tablet   enoxaparin 40 MG/0.4ML injection Commonly known as: LOVENOX Inject 0.4 mLs (40 mg total) into the skin 2 (two) times daily for 28 days.   fexofenadine 180 MG tablet Commonly known as: ALLEGRA Take 180 mg by mouth daily as needed for allergies.   fluticasone 50 MCG/ACT nasal spray Commonly known as: FLONASE 1 spray each nostril following sinus rinses twice daily What changed:  how much to take how to take this when to take this reasons to take this additional instructions   ondansetron 4 MG disintegrating tablet Commonly known as: ZOFRAN-ODT Take 1 tablet (4 mg total) by mouth every 6 (six) hours as needed for nausea or vomiting.   pantoprazole 40 MG tablet Commonly known as: PROTONIX Take 1 tablet (40 mg total) by mouth daily.   traMADol 50 MG tablet Commonly known as: ULTRAM Take 1 tablet (50 mg total) by mouth every 6 (six) hours as needed (pain).   valsartan-hydrochlorothiazide 80-12.5 MG tablet Commonly known as: DIOVAN-HCT TAKE 1 TABLET DAILY Notes to patient: Monitor Blood Pressure Daily and keep a log for primary care physician.  You may need to make changes to your medications with rapid weight loss.     Vitamin D (Ergocalciferol) 1.25 MG (50000 UNIT) Caps capsule Commonly known as: DRISDOL Take 1 capsule by mouth two times a week. What changed:  how much to take how to take this when to take this additional instructions        Follow-up Information     Greer Pickerel, MD. Go on 01/28/2021.   Specialty: General  Surgery Why: at 4pm.  Please arrive 15 minutes prior to your appointment time.  Thank you. Contact information: [address] [place] [address] [phone]         Carlena Hurl, PA-C. Go on 03/02/2021.   Specialty: General Surgery Why: at 2:15pm for Dr. Redmond Pulling.  Please arrive 15 minutes prior to your appointment time.  Thank you. Contact information: [address] [place] [address] [phone]                  The results of significant diagnostics from this hospitalization (including imaging, microbiology, ancillary and laboratory) are listed below for reference.    Significant Diagnostic Studies: No results found.  Labs: Basic Metabolic Panel: Recent Labs  Lab 01/01/21 0801 01/05/21 0436  NA 136 137  K 4.0  4.2  CL 104 105  CO2 27 25  GLUCOSE 103* 137*  BUN 19 8  CREATININE 0.63 0.67  CALCIUM 8.6* 8.5*   Liver Function Tests: Recent Labs  Lab 01/01/21 0801 01/05/21 0436  AST 21 65*  ALT 23 85*  ALKPHOS 65 57  BILITOT 0.6 0.8  PROT 6.5 6.5  ALBUMIN 3.4* 3.5    CBC: Recent Labs  Lab 01/01/21 0801 01/04/21 1318 01/05/21 0436  WBC 8.3  --  10.1  NEUTROABS 5.5  --  7.8*  HGB 12.7 13.2 12.2  HCT 39.1 40.4 36.9  MCV 87.5  --  87.0  PLT 254  --  261    CBG: No results for input(s): GLUCAP in the last 168 hours.  Principal Problem:   S/P laparoscopic sleeve gastrectomy   Time coordinating discharge: 20 min  Signed:  Gayland Curry, MD [facility], [place] [phone] 01/05/2021, 1:41 PM

## 2021-01-06 NOTE — Progress Notes (Signed)
24hr fluid recall prior to discharge: 675mL. Per dehydration protocol, will call pt to f/u within one week post op.

## 2021-01-07 LAB — SURGICAL PATHOLOGY

## 2021-01-08 ENCOUNTER — Telehealth (HOSPITAL_COMMUNITY): Payer: Self-pay | Admitting: *Deleted

## 2021-01-08 NOTE — Telephone Encounter (Signed)
1.  Tell me about your pain and pain management? Pt denies any pain.  2.  Let's talk about fluid intake.  How much total fluid are you taking in? Pt states that she is getting in at least 64oz of fluid including protein shakes/waters, bottled water, broth and yogurt.  3.  How much protein have you taken in the last 2 days? Pt states she is meeting her goal of 60g of protein each day with the protein shakes/waters and yogurt.  4.  Have you had nausea?  Tell me about when have experienced nausea and what you did to help? Pt denies nausea.   5.  Has the frequency or color changed with your urine? Pt states that she is urinating "fine" with no changes in frequency or urgency.     6.  Tell me what your incisions look like? "Incisions look fine". Pt denies a fever, chills.  Pt states incisions are not swollen, open, or draining.  Pt encouraged to call [facility] if incisions change.   7.  Have you been passing gas? BM? Pt states that she is having BMs. Last BM 01/08/21.     8.  If a problem or question were to arise who would you call?  Do you know contact numbers for [facility], [facility], and [facility]? Pt denies dehydration symptoms.  Pt can describe s/sx of dehydration.  Pt knows to call [facility] for surgical, [facility] for nutrition, and [facility] for non-urgent questions or concerns.   9.  How has the walking going? Pt states she is walking around and able to be active without difficulty.   10. Are you still using your incentive spirometer?  If so, how often? Pt states that she is doing the I.S. every hour. Pt encouraged to use incentive spirometer, at least 10x every hour while awake until she sees the surgeon.  11.  How are your vitamins and calcium going?  How are you taking them? Pt states that she is taking her supplements and vitamins without difficulty.  Reminded patient that the first 30 days post-operatively are important for successful recovery.  Practice good hand hygiene, wearing a mask when appropriate (since  optional in most places), and minimizing exposure to people who live outside of the home, especially if they are exhibiting any respiratory, GI, or illness-like symptoms.

## 2021-01-19 ENCOUNTER — Encounter: Payer: Commercial Managed Care - PPO | Attending: General Surgery | Admitting: Skilled Nursing Facility1

## 2021-01-19 ENCOUNTER — Other Ambulatory Visit: Payer: Self-pay

## 2021-01-19 NOTE — Progress Notes (Signed)
2 Week Post-Operative Nutrition Class   Patient was seen on 01/19/2021 for Post-Operative Nutrition education at the [facility].   Pt states she has some incisional soreness.   Pt states she is feeling zapped a lot (tired).   Pt states she thinks she is getting in about 70-80 ounces per day and getting in about 60 grams of protein.   Pt states she has shakiness: Dietitian advsied pt to check her blood pressure and keep her doc abreast if her blood pressures are low.  Pt states her body does not process viamtin D so she takes extra separate from the multi.   Pt states she uses and likes the "lose it" app.   Surgery date: 01/05/2022 Surgery type: sleeve Start weight at [facility]: 331.3 Weight today: pt declined Bowel Habits: Every day to every other day no complaints     The following the learning objectives were met by the patient during this course: Identifies Phase 3 (Soft, High Proteins) Dietary Goals and will begin from 2 weeks post-operatively to 2 months post-operatively Identifies appropriate sources of fluids and proteins  Identifies appropriate fat sources and healthy verses unhealthy fat types   States protein recommendations and appropriate sources post-operatively Identifies the need for appropriate texture modifications, mastication, and bite sizes when consuming solids Identifies appropriate fat consumption and sources Identifies appropriate multivitamin and calcium sources post-operatively Describes the need for physical activity post-operatively and will follow MD recommendations States when to call healthcare provider regarding medication questions or post-operative complications   Handouts given during class include: Phase 3A: Soft, High Protein Diet Handout Phase 3 High Protein Meals Healthy Fats   Follow-Up Plan: Patient will follow-up at [facility] in 6 weeks for 2 month post-op nutrition visit for diet advancement per MD.

## 2021-01-20 ENCOUNTER — Other Ambulatory Visit: Payer: Self-pay | Admitting: Physician Assistant

## 2021-01-20 DIAGNOSIS — I10 Essential (primary) hypertension: Secondary | ICD-10-CM

## 2021-01-21 NOTE — Telephone Encounter (Signed)
Scheduled a follow up for patient.

## 2021-01-25 ENCOUNTER — Telehealth: Payer: Self-pay | Admitting: Skilled Nursing Facility1

## 2021-01-25 NOTE — Telephone Encounter (Signed)
RD called pt to verify fluid intake once starting soft, solid proteins 2 week post-bariatric surgery.   Daily Fluid intake: 64 + Daily Protein intake: 60 g Bowel Habits: every day to every other day  Concerns/issues:   Pt states she stopped taking her blood pressure meds due to having low blood pressures; pt states she has been in communication with her physician.  Pt sates she is just feeling tired.   Pt states just doing protein is tough: Dietitian offered some different options help her with variety and feel not as limited.

## 2021-01-28 ENCOUNTER — Ambulatory Visit: Payer: Commercial Managed Care - PPO | Admitting: Physician Assistant

## 2021-01-29 ENCOUNTER — Ambulatory Visit: Payer: Commercial Managed Care - PPO | Admitting: Physician Assistant

## 2021-02-01 ENCOUNTER — Encounter: Payer: Self-pay | Admitting: Physician Assistant

## 2021-02-01 ENCOUNTER — Ambulatory Visit (INDEPENDENT_AMBULATORY_CARE_PROVIDER_SITE_OTHER): Payer: Commercial Managed Care - PPO | Admitting: Physician Assistant

## 2021-02-01 ENCOUNTER — Other Ambulatory Visit: Payer: Self-pay

## 2021-02-01 VITALS — BP 109/68 | HR 70 | Temp 97.2°F | Ht 62.0 in | Wt 316.0 lb

## 2021-02-01 DIAGNOSIS — E559 Vitamin D deficiency, unspecified: Secondary | ICD-10-CM

## 2021-02-01 DIAGNOSIS — Z903 Acquired absence of stomach [part of]: Secondary | ICD-10-CM

## 2021-02-01 DIAGNOSIS — I10 Essential (primary) hypertension: Secondary | ICD-10-CM

## 2021-02-01 NOTE — Patient Instructions (Signed)
Vitamin D Deficiency Vitamin D deficiency is when your body does not have enough vitamin D. Vitamin D is important to your body for many reasons: It helps the body absorb two important minerals--calcium and phosphorus. It plays a role in bone health. It may help to prevent some diseases, such as diabetes and multiple sclerosis. It plays a role in muscle function, including heart function. If vitamin D deficiency is severe, it can cause a condition in which your bones become soft. In adults, this condition is called osteomalacia. In children, this condition is called rickets. What are the causes? This condition may be caused by: Not eating enough foods that contain vitamin D. Not getting enough natural sun exposure. Having certain digestive system diseases that make it difficult for your body to absorb vitamin D. These diseases include Crohn's disease, chronic pancreatitis, and cystic fibrosis. Having a surgery in which a part of the stomach or a part of the small intestine is removed. Having chronic kidney disease or liver disease. What increases the risk? You are more likely to develop this condition if you: Are older. Do not spend much time outdoors. Live in a long-term care facility. Have had broken bones. Have weak or thin bones (osteoporosis). Have a disease or condition that changes how the body absorbs vitamin D. Have dark skin. Take certain medicines, such as steroid medicines or certain seizure medicines. Are overweight or obese. What are the signs or symptoms? In mild cases of vitamin D deficiency, there may not be any symptoms. If the condition is severe, symptoms may include: Bone pain. Muscle pain. Falling often. Broken bones caused by a minor injury. How is this diagnosed? This condition may be diagnosed with blood tests. Imaging tests such as X-rays may also be done to look for changes in the bone. How is this treated? Treatment for this condition may depend on what  caused the condition. Treatment options include: Taking vitamin D supplements. Your health care provider will suggest what dose is best for you. Taking a calcium supplement. Your health care provider will suggest what dose is best for you. Follow these instructions at home: Eating and drinking  Eat foods that contain vitamin D. Choices include: Fortified dairy products, cereals, or juices. Fortified means that vitamin D has been added to the food. Check the label on the package to see if the food is fortified. Fatty fish, such as salmon or trout. Eggs. Oysters. Mushrooms. The items listed above may not be a complete list of recommended foods and beverages. Contact a dietitian for more information. General instructions Take medicines and supplements only as told by your health care provider. Get regular, safe exposure to natural sunlight. Do not use a tanning bed. Maintain a healthy weight. Lose weight if needed. Keep all follow-up visits as told by your health care provider. This is important. How is this prevented? You can get vitamin D by: Eating foods that naturally contain vitamin D. Eating or drinking products that have been fortified with vitamin D, such as cereals, juices, and dairy products (including milk). Taking a vitamin D supplement or a multivitamin supplement that contains vitamin D. Being in the sun. Your body naturally makes vitamin D when your skin is exposed to sunlight. Your body changes the sunlight into a form of the vitamin that it can use. Contact a health care provider if: Your symptoms do not go away. You feel nauseous or you vomit. You have fewer bowel movements than usual or are constipated. Summary Vitamin  D deficiency is when your body does not have enough vitamin D. Vitamin D is important to your body for good bone health and muscle function, and it may help prevent some diseases. Vitamin D deficiency is primarily treated through supplementation. Your  health care provider will suggest what dose is best for you. You can get vitamin D by eating foods that contain vitamin D, by being in the sun, and by taking a vitamin D supplement or a multivitamin supplement that contains vitamin D. This information is not intended to replace advice given to you by your health care provider. Make sure you discuss any questions you have with your health care provider. Document Revised: 09/11/2017 Document Reviewed: 09/11/2017 Elsevier Patient Education  [employer].
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
## 2021-02-01 NOTE — Assessment & Plan Note (Signed)
-  On Vitamin D 50,000 units twice weekly. Last Vitamin D 54.1, will repeat Vit D. Pending lab results will make treatment adjustments if indicated.

## 2021-02-02 LAB — CBC WITH DIFFERENTIAL/PLATELET
Basophils Absolute: 0.1 10*3/uL (ref 0.0–0.2)
Basos: 1 %
EOS (ABSOLUTE): 0.3 10*3/uL (ref 0.0–0.4)
Eos: 4 %
Hematocrit: 41 % (ref 34.0–46.6)
Hemoglobin: 13.4 g/dL (ref 11.1–15.9)
Immature Grans (Abs): 0 10*3/uL (ref 0.0–0.1)
Immature Granulocytes: 0 %
Lymphocytes Absolute: 1.8 10*3/uL (ref 0.7–3.1)
Lymphs: 21 %
MCH: 28.2 pg (ref 26.6–33.0)
MCHC: 32.7 g/dL (ref 31.5–35.7)
MCV: 86 fL (ref 79–97)
Monocytes Absolute: 0.8 10*3/uL (ref 0.1–0.9)
Monocytes: 9 %
Neutrophils Absolute: 5.6 10*3/uL (ref 1.4–7.0)
Neutrophils: 65 %
Platelets: 261 10*3/uL (ref 150–450)
RBC: 4.76 x10E6/uL (ref 3.77–5.28)
RDW: 14.3 % (ref 11.7–15.4)
WBC: 8.7 10*3/uL (ref 3.4–10.8)

## 2021-02-02 LAB — BASIC METABOLIC PANEL
BUN/Creatinine Ratio: 25 — ABNORMAL HIGH (ref 9–23)
BUN: 19 mg/dL (ref 6–24)
CO2: 20 mmol/L (ref 20–29)
Calcium: 9.2 mg/dL (ref 8.7–10.2)
Chloride: 105 mmol/L (ref 96–106)
Creatinine, Ser: 0.76 mg/dL (ref 0.57–1.00)
Sodium: 142 mmol/L (ref 134–144)
eGFR: 94 mL/min/{1.73_m2} (ref >59)

## 2021-02-02 LAB — VITAMIN D 25 HYDROXY (VIT D DEFICIENCY, FRACTURES): Vit D, 25-Hydroxy: 110 ng/mL — ABNORMAL HIGH (ref 30.0–100.0)

## 2021-02-08 ENCOUNTER — Telehealth: Payer: Self-pay

## 2021-02-08 NOTE — Telephone Encounter (Signed)
Transition Care Management Unsuccessful Follow-up Telephone Call  Date of discharge and from where:  01/05/2021  [facility]  Attempts:  2nd Attempt  Reason for unsuccessful TCM follow-up call:  No answer/busy  Tomasa Rand, RN, BSN, CEN [place] Coordinator [phone]

## 2021-02-08 NOTE — Telephone Encounter (Signed)
Transition Care Management Unsuccessful Follow-up Telephone Call  Date of discharge and from where:  01/05/2021  [name]  Attempts:  1st Attempt 1st attempt completed on 02/05/2021- no answer Reason for unsuccessful TCM follow-up call:  No answer/busy Tomasa Rand, RN, BSN, CEN [place] Coordinator [phone]

## 2021-03-03 ENCOUNTER — Ambulatory Visit: Payer: Commercial Managed Care - PPO | Admitting: Skilled Nursing Facility1

## 2021-03-03 ENCOUNTER — Encounter: Payer: Commercial Managed Care - PPO | Attending: General Surgery | Admitting: Skilled Nursing Facility1

## 2021-03-03 NOTE — Progress Notes (Signed)
Bariatric Nutrition Follow-Up Visit Medical Nutrition Therapy    NUTRITION ASSESSMENT    Anthropometrics  Start weight at [facility]: 331 lb Today's weight: virtual appt; pt identified by name and DOB; pt agreeable to the limitations of this visit type  Surgery date: 01/05/2022 Surgery type: sleeve Start weight at [facility]: 331.3 Weight today: virtual appt   Lifestyle & Dietary Hx  Pt states she longer takes an extra vitamin D because it came back high.   Pt states she is getting a colonoscopy this week to check on things.  Pt stets she grazes instead of having meals.  Pt sates she is trying to make a conscious effort to drink enough fluid.  Pt states she does not have tim to be active. Pt states she is trying to actually go talk to people at work instead of calling them and parking farther away.   Estimated daily fluid intake: averages 60 oz Estimated daily protein intake: 70 g Supplements: multivitamin and calcium Current average weekly physical activity: ADL's   24-Hr Dietary Recall First Meal: premier clear Snack:   Second Meal: cottage Snack:  steak and chicken Third Meal: beans + cheese + enchiladas sauce Snack:  Beverages: water  Post-Op Goals/ Signs/ Symptoms Using straws: no Drinking while eating: no Chewing/swallowing difficulties: no Changes in vision: no Changes to mood/headaches: no Hair loss/changes to skin/nails: no Difficulty focusing/concentrating: no Sweating: no Limb weakness: no Dizziness/lightheadedness: no Palpitations: no  Carbonated/caffeinated beverages: no N/V/D/C/Gas: no Abdominal pain: no Dumping syndrome: no    NUTRITION DIAGNOSIS  Overweight/obesity (-3.3) related to past poor dietary habits and physical inactivity as evidenced by completed bariatric surgery and following dietary guidelines for continued weight loss and healthy nutrition status.     NUTRITION INTERVENTION Nutrition counseling (C-1) and education (E-2) to facilitate  bariatric surgery goals, including: Diet advancement to the next phase (phase 4) now including non starchy vegetables The importance of consuming adequate calories as well as certain nutrients daily due to the body's need for essential vitamins, minerals, and fats The importance of daily physical activity and to reach a goal of at least 150 minutes of moderate to vigorous physical activity weekly (or as directed by their physician) due to benefits such as increased musculature and improved lab values The importance of intuitive eating specifically learning hunger-satiety cues and understanding the importance of learning a new body: The importance of mindful eating to avoid grazing behaviors   Goals: -Continue to aim for a minimum of 64 fluid ounces 7 days a week with at least 30 ounces being plain water  -Eat non-starchy vegetables 2 times a day 7 days a week  -Start out with soft cooked vegetables today and tomorrow; if tolerated begin to eat raw vegetables or cooked including salads  -Eat your 3 ounces of protein first then start in on your non-starchy vegetables; once you understand how much of your meal leads to satisfaction and not full while still eating 3 ounces of protein and non-starchy vegetables you can eat them in any order   -Continue to aim for 30 minutes of activity at least 5 times a week  -Do NOT cook with/add to your food: alfredo sauce, cheese sauce, barbeque sauce, ketchup, fat back, butter, bacon grease, grease, Crisco, OR SUGAR  -try to add in some resistance activity such as a resistance band tied to your chair   Handouts Provided Include  Phase 4  Learning Style & Readiness for Change Teaching method utilized: Visual & Auditory  Demonstrated  degree of understanding via: Teach Back  Readiness Level: action Barriers to learning/adherence to lifestyle change: none identified  RD's Notes for Next Visit Assess adherence to pt chosen goals    MONITORING &  EVALUATION Dietary intake, weekly physical activity, body weight  Next Steps Patient is to follow-up in May

## 2021-03-04 ENCOUNTER — Inpatient Hospital Stay: Admission: RE | Admit: 2021-03-04 | Payer: Commercial Managed Care - PPO | Source: Ambulatory Visit

## 2021-03-10 ENCOUNTER — Other Ambulatory Visit: Payer: Self-pay

## 2021-03-10 ENCOUNTER — Ambulatory Visit
Admission: RE | Admit: 2021-03-10 | Discharge: 2021-03-10 | Disposition: A | Discharge status: Home / Self Care | Payer: Commercial Managed Care - PPO | Source: Ambulatory Visit | Attending: Family Medicine | Admitting: Family Medicine

## 2021-03-10 VITALS — BP 168/96 | HR 75 | Temp 98.6°F | Resp 18

## 2021-03-10 DIAGNOSIS — R220 Localized swelling, mass and lump, head: Secondary | ICD-10-CM | POA: Diagnosis not present

## 2021-03-10 DIAGNOSIS — R6884 Jaw pain: Secondary | ICD-10-CM | POA: Diagnosis not present

## 2021-03-10 DIAGNOSIS — I10 Essential (primary) hypertension: Secondary | ICD-10-CM

## 2021-03-10 MED ORDER — HYDROCODONE-ACETAMINOPHEN 5-325 MG PO TABS: 1.0000 | ORAL_TABLET | Freq: Four times a day (QID) | ORAL | 0 refills | Status: DC | PRN

## 2021-03-10 MED ORDER — PREDNISONE 20 MG PO TABS: 40.0000 mg | ORAL_TABLET | Freq: Every day | ORAL | 0 refills | Status: DC

## 2021-03-10 NOTE — Discharge Instructions (Signed)
Be aware, you have been prescribed pain medications that may cause drowsiness. While taking this medication, do not take any other medications containing acetaminophen (Tylenol). Do not combine with alcohol or other illicit drugs. Please do not drive, operate heavy machinery, or take part in activities that require making important decisions while on this medication as your judgement may be clouded.  Your blood pressure was noted to be elevated during your visit today. If you are currently taking medication for high blood pressure, please ensure you are taking this as directed. If you do not have a history of high blood pressure and your blood pressure remains persistently elevated, you may need to begin taking a medication at some point. You may return here within the next few days to recheck if unable to see your primary care provider or if you do not have a one.  BP (!) 168/96 (BP Location: Left Wrist)    Pulse 75    Temp 98.6 F (37 C) (Oral)    Resp 18    SpO2 95%   BP Readings from Last 3 Encounters:  03/10/21 (!) 168/96  02/01/21 109/68  01/05/21 (!) 151/76

## 2021-03-10 NOTE — ED Triage Notes (Signed)
Pt reports left sided jaw pain on and off since November, 2022; bump and swelling in left side jaw x 1 day. Pt reports the bump is bigger since yesterday, after she started Augmentin yesterday, after a video visit. Pt reports she had a colonoscopy and they did a procedure to open the jaw that may injured the jaw.
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
## 2021-04-01 ENCOUNTER — Other Ambulatory Visit: Payer: Commercial Managed Care - PPO

## 2021-08-06 ENCOUNTER — Encounter: Payer: Commercial Managed Care - PPO | Admitting: Physician Assistant

## 2021-08-24 ENCOUNTER — Encounter: Payer: Self-pay | Admitting: Physician Assistant

## 2021-08-24 ENCOUNTER — Other Ambulatory Visit: Payer: Self-pay

## 2021-08-24 ENCOUNTER — Ambulatory Visit (INDEPENDENT_AMBULATORY_CARE_PROVIDER_SITE_OTHER): Payer: Commercial Managed Care - PPO | Admitting: Physician Assistant

## 2021-08-24 VITALS — BP 128/76 | HR 59 | Temp 98.2°F | Resp 17 | Ht 62.0 in | Wt 282.0 lb

## 2021-08-24 DIAGNOSIS — Z130 Encounter for screening for diseases of the blood and blood-forming organs and certain disorders involving the immune mechanism: Secondary | ICD-10-CM

## 2021-08-24 DIAGNOSIS — R0781 Pleurodynia: Secondary | ICD-10-CM | POA: Diagnosis not present

## 2021-08-24 DIAGNOSIS — E559 Vitamin D deficiency, unspecified: Secondary | ICD-10-CM | POA: Diagnosis not present

## 2021-08-24 DIAGNOSIS — I10 Essential (primary) hypertension: Secondary | ICD-10-CM

## 2021-08-24 DIAGNOSIS — Z0000 Encounter for general adult medical examination without abnormal findings: Secondary | ICD-10-CM

## 2021-08-24 NOTE — Progress Notes (Signed)
Complete physical exam   Patient: Abigail Cruz   DOB: May 08, 1965   56 y.o. Female  MRN: [id number] Visit Date: 08/24/2021   Chief Complaint  Patient presents with   Annual Exam    Pt reports back pain for several months and would like an XR done due to previous rib fracture    Subjective    Abigail Cruz is a 56 y.o. female who presents today for a complete physical exam.  She reports consuming a low fat and low sugar  diet. The patient does not participate in regular exercise at present. She generally feels fairly well. She does have additional problems to discuss today. Back pain right sided and rib pain since Jan 2023. Is concerned if she fractured a rib that did not heal correctly. Requesting xray.     Past Medical History:  Diagnosis Date   Abnormal Pap smear of cervix    in her 20's   Arthritis    hands   Asthma    triggered by smoke   History of kidney stones    Hypertension    Pneumonia    Past Surgical History:  Procedure Laterality Date   bladder stretching  2000   CHOLECYSTECTOMY  2010   COLONOSCOPY WITH PROPOFOL N/A 10/27/2020   Procedure: COLONOSCOPY WITH PROPOFOL;  Surgeon: Irene Shipper, MD;  Location: [facility];  Service: Endoscopy;  Laterality: N/A;   LAPAROSCOPIC GASTRIC SLEEVE RESECTION WITH HIATAL HERNIA REPAIR N/A 01/04/2021   Procedure: LAPAROSCOPIC GASTRIC SLEEVE RESECTION;  Surgeon: Greer Pickerel, MD;  Location: [facility];  Service: General;  Laterality: N/A;   LITHOTRIPSY  2012   OTHER SURGICAL HISTORY     peritoneal cyst removal   POLYPECTOMY  10/27/2020   Procedure: POLYPECTOMY;  Surgeon: Irene Shipper, MD;  Location: [facility];  Service: Endoscopy;;   SUBMUCOSAL TATTOO INJECTION  10/27/2020   Procedure: SUBMUCOSAL TATTOO INJECTION;  Surgeon: Irene Shipper, MD;  Location: [facility];  Service: Endoscopy;;   TUBAL LIGATION  2001   with cyst removal   UPPER GI ENDOSCOPY N/A 01/04/2021   Procedure: UPPER GI ENDOSCOPY;  Surgeon:  Greer Pickerel, MD;  Location: [facility];  Service: General;  Laterality: N/A;   Social History   Socioeconomic History   Marital status: Married    Spouse name: Not on file   Number of children: Not on file   Years of education: Not on file   Highest education level: Not on file  Occupational History   Not on file  Tobacco Use   Smoking status: Never    Passive exposure: Past   Smokeless tobacco: Never  Vaping Use   Vaping Use: Never used  Substance and Sexual Activity   Alcohol use: No   Drug use: No   Sexual activity: Yes    Partners: Male    Birth control/protection: Surgical    Comment: btl  Other Topics Concern   Not on file  Social History Narrative   Not on file   Social Determinants of Health   Financial Resource Strain: Not on file  Food Insecurity: Not on file  Transportation Needs: Not on file  Physical Activity: Not on file  Stress: Not on file  Social Connections: Not on file  Intimate Partner Violence: Not on file     Medications: Outpatient Medications Prior to Visit  Medication Sig   fexofenadine (ALLEGRA) 180 MG tablet Take 180 mg by mouth daily as needed for allergies.   fluticasone (  FLONASE) 50 MCG/ACT nasal spray 1 spray each nostril following sinus rinses twice daily (Patient taking differently: Place 1 spray into both nostrils daily as needed (allergies.).)   amoxicillin-clavulanate (AUGMENTIN) 875-125 MG tablet SMARTSIG:1 Tablet(s) By Mouth Every 12 Hours   HYDROcodone-acetaminophen (NORCO/VICODIN) 5-325 MG tablet Take 1 tablet by mouth every 6 (six) hours as needed for moderate pain or severe pain.   predniSONE (DELTASONE) 20 MG tablet Take 2 tablets (40 mg total) by mouth daily.   No facility-administered medications prior to visit.    Review of Systems Review of Systems:  A fourteen system review of systems was performed and found to be positive as per HPI.  Last CBC Lab Results  Component Value Date   WBC 8.7 02/01/2021   HGB 13.4  02/01/2021   HCT 41.0 02/01/2021   MCV 86 02/01/2021   MCH 28.2 02/01/2021   RDW 14.3 02/01/2021   PLT 261 18/84/1660   Last metabolic panel Lab Results  Component Value Date   GLUCOSE CANCELED 02/01/2021   NA 142 02/01/2021   K CANCELED 02/01/2021   CL 105 02/01/2021   CO2 20 02/01/2021   BUN 19 02/01/2021   CREATININE 0.76 02/01/2021   EGFR 94 02/01/2021   CALCIUM 9.2 02/01/2021   PROT 6.5 01/05/2021   ALBUMIN 3.5 01/05/2021   LABGLOB 2.3 03/31/2020   AGRATIO 1.7 03/31/2020   BILITOT 0.8 01/05/2021   ALKPHOS 57 01/05/2021   AST 65 (H) 01/05/2021   ALT 85 (H) 01/05/2021   ANIONGAP 7 01/05/2021   Last lipids Lab Results  Component Value Date   CHOL 146 09/12/2019   HDL 42 09/12/2019   LDLCALC 78 09/12/2019   LDLDIRECT 97 03/31/2020   TRIG 150 (H) 09/12/2019   CHOLHDL 3.5 09/12/2019   Last hemoglobin A1c Lab Results  Component Value Date   HGBA1C 5.3 03/31/2020   Last thyroid functions Lab Results  Component Value Date   TSH 2.290 03/31/2020   Last vitamin D Lab Results  Component Value Date   VD25OH 110.0 (H) 02/01/2021    Objective     BP 128/76   Pulse (!) 59   Temp 98.2 F (36.8 C) (Oral)   Resp 17   Ht '5\' 2"'  (1.575 m)   Wt 282 lb (127.9 kg)   SpO2 97%   BMI 51.58 kg/m  BP Readings from Last 3 Encounters:  08/24/21 128/76  03/10/21 (!) 168/96  02/01/21 109/68   Wt Readings from Last 3 Encounters:  08/24/21 282 lb (127.9 kg)  02/01/21 (!) 316 lb (143.3 kg)  01/04/21 (!) 333 lb 12.8 oz (151.4 kg)    Physical Exam   General Appearance:    Alert, cooperative, in no acute distress, appears stated age   Head:    Normocephalic, without obvious abnormality, atraumatic  Eyes:    PERRL, conjunctiva/corneas clear, EOM's intact, fundi    benign, both eyes  Ears:    Normal TM's and external ear canals, both ears  Nose:   Nares normal, septum midline, mucosa normal, no drainage    or sinus tenderness  Throat:   Lips, mucosa, and tongue  normal; teeth and gums normal  Neck:   Supple, symmetrical, trachea midline, no adenopathy;    thyroid:  no enlargement/tenderness/nodules; no JVD  Back:     Symmetric, no curvature, ROM normal, no CVA tenderness  Lungs:     Clear to auscultation bilaterally, respirations unlabored  Chest Wall:    No tenderness or deformity  Heart:    Bradycardic. Normal rhythm. No murmurs, rubs, or gallops.   Breast Exam:    deferred  Abdomen:     Soft, non-tender, bowel sounds active all four quadrants,    no masses, no organomegaly  Pelvic:    deferred  Extremities:   All extremities are intact. No cyanosis or edema  Pulses:   2+ and symmetric all extremities  Skin:   Skin color, texture, turgor normal, no rashes or lesions  Lymph nodes:   Cervical and supraclavicular nodes normal  Neurologic:   CNII-XII grossly intact.     Last depression screening scores    08/24/2021    1:22 PM 02/01/2021    3:54 PM 08/12/2020    4:02 PM  PHQ 2/9 Scores  PHQ - 2 Score 0 0 0  PHQ- 9 Score  3    Last fall risk screening    08/24/2021    1:22 PM  [place] in the past year? 0  Number falls in past yr: 0  Injury with Fall? 0  Risk for fall due to : No Fall Risks  Follow up Falls evaluation completed     No results found for any visits on 08/24/21.  Assessment & Plan    Routine Health Maintenance and Physical Exam  Exercise Activities and Dietary recommendations -Discussed heart healthy diet low in fat and carbohydrates. Recommend moderate exercise 150 mins/wk.  Immunization History  Administered Date(s) Administered   Pneumococcal Polysaccharide-23 01/18/2008   Tdap 12/12/2018    Health Maintenance  Topic Date Due   INFLUENZA VACCINE  08/17/2021   COVID-19 Vaccine (1) 09/09/2021 (Originally 01/04/1966)   Zoster Vaccines- Shingrix (1 of 2) 11/24/2021 (Originally 07/04/2017)   Hepatitis C Screening  08/25/2022 (Originally 07/04/1985)   HIV Screening  08/25/2022 (Originally 07/05/1982)    PAP SMEAR-Modifier  07/03/2022   MAMMOGRAM  09/11/2022   TETANUS/TDAP  12/11/2028   COLONOSCOPY (Pts 45-45yr Insurance coverage will need to be confirmed)  10/28/2030   HPV VACCINES  Aged Out    Discussed health benefits of physical activity, and encouraged her to engage in regular exercise appropriate for her age and condition.  Problem List Items Addressed This Visit       Other   Vitamin D deficiency (Chronic)   Other Visit Diagnoses     Healthcare maintenance    -  Primary   Rib pain on right side       Relevant Orders   XR Ribs Bilateral W/Chest   Hypertension, unspecified type          Advised to schedule lab visit for routine fasting labs including Vitamin D. UTD mammogram, pap, Tdap and colonoscopy.  Deferred immunizations, Hep C and HIV screenings. Continue with sessions with nutritionist. Discussed adequate hydration. Will place order for imaging of bilateral ribs with chest xr. BP diet controlled. Follow-up in 1 year for CPE and FBW or sooner if needed.  Return in about 1 year (around 08/25/2022) for CPE and FBW; lab visit for FBW including vit d in 1-4 weeks.       MLorrene Reid PA-C  [phone](phone) [phone](fax)  [place]

## 2021-08-24 NOTE — Patient Instructions (Signed)
Preventive Care 40-54 Years Old, Female Preventive care refers to lifestyle choices and visits with your health care provider that can promote health and wellness. Preventive care visits are also called wellness exams. What can I expect for my preventive care visit? Counseling Your health care provider may ask you questions about your: Medical history, including: Past medical problems. Family medical history. Pregnancy history. Current health, including: Menstrual cycle. Method of birth control. Emotional well-being. Home life and relationship well-being. Sexual activity and sexual health. Lifestyle, including: Alcohol, nicotine or tobacco, and drug use. Access to firearms. Diet, exercise, and sleep habits. Work and work environment. Sunscreen use. Safety issues such as seatbelt and bike helmet use. Physical exam Your health care provider will check your: Height and weight. These may be used to calculate your BMI (body mass index). BMI is a measurement that tells if you are at a healthy weight. Waist circumference. This measures the distance around your waistline. This measurement also tells if you are at a healthy weight and may help predict your risk of certain diseases, such as type 2 diabetes and high blood pressure. Heart rate and blood pressure. Body temperature. Skin for abnormal spots. What immunizations do I need?  Vaccines are usually given at various ages, according to a schedule. Your health care provider will recommend vaccines for you based on your age, medical history, and lifestyle or other factors, such as travel or where you work. What tests do I need? Screening Your health care provider may recommend screening tests for certain conditions. This may include: Lipid and cholesterol levels. Diabetes screening. This is done by checking your blood sugar (glucose) after you have not eaten for a while (fasting). Pelvic exam and Pap test. Hepatitis B test. Hepatitis C  test. HIV (human immunodeficiency virus) test. STI (sexually transmitted infection) testing, if you are at risk. Lung cancer screening. Colorectal cancer screening. Mammogram. Talk with your health care provider about when you should start having regular mammograms. This may depend on whether you have a family history of breast cancer. BRCA-related cancer screening. This may be done if you have a family history of breast, ovarian, tubal, or peritoneal cancers. Bone density scan. This is done to screen for osteoporosis. Talk with your health care provider about your test results, treatment options, and if necessary, the need for more tests. Follow these instructions at home: Eating and drinking  Eat a diet that includes fresh fruits and vegetables, whole grains, lean protein, and low-fat dairy products. Take vitamin and mineral supplements as recommended by your health care provider. Do not drink alcohol if: Your health care provider tells you not to drink. You are pregnant, may be pregnant, or are planning to become pregnant. If you drink alcohol: Limit how much you have to 0-1 drink a day. Know how much alcohol is in your drink. In the [place] one drink equals one 12 oz bottle of beer (355 mL), one 5 oz glass of wine (148 mL), or one 1 oz glass of hard liquor (44 mL). Lifestyle Brush your teeth every morning and night with fluoride toothpaste. Floss one time each day. Exercise for at least 30 minutes 5 or more days each week. Do not use any products that contain nicotine or tobacco. These products include cigarettes, chewing tobacco, and vaping devices, such as e-cigarettes. If you need help quitting, ask your health care provider. Do not use drugs. If you are sexually active, practice safe sex. Use a condom or other form of protection to   prevent STIs. If you do not wish to become pregnant, use a form of birth control. If you plan to become pregnant, see your health care provider for a  prepregnancy visit. Take aspirin only as told by your health care provider. Make sure that you understand how much to take and what form to take. Work with your health care provider to find out whether it is safe and beneficial for you to take aspirin daily. Find healthy ways to manage stress, such as: Meditation, yoga, or listening to music. Journaling. Talking to a trusted person. Spending time with friends and family. Minimize exposure to UV radiation to reduce your risk of skin cancer. Safety Always wear your seat belt while driving or riding in a vehicle. Do not drive: If you have been drinking alcohol. Do not ride with someone who has been drinking. When you are tired or distracted. While texting. If you have been using any mind-altering substances or drugs. Wear a helmet and other protective equipment during sports activities. If you have firearms in your house, make sure you follow all gun safety procedures. Seek help if you have been physically or sexually abused. What's next? Visit your health care provider once a year for an annual wellness visit. Ask your health care provider how often you should have your eyes and teeth checked. Stay up to date on all vaccines. This information is not intended to replace advice given to you by your health care provider. Make sure you discuss any questions you have with your health care provider. Document Revised: 07/01/2020 Document Reviewed: 07/01/2020 Elsevier Patient Education  [place].

## 2021-08-25 ENCOUNTER — Other Ambulatory Visit: Payer: Commercial Managed Care - PPO

## 2021-08-25 DIAGNOSIS — E559 Vitamin D deficiency, unspecified: Secondary | ICD-10-CM

## 2021-08-25 DIAGNOSIS — Z130 Encounter for screening for diseases of the blood and blood-forming organs and certain disorders involving the immune mechanism: Secondary | ICD-10-CM

## 2021-08-25 DIAGNOSIS — Z0000 Encounter for general adult medical examination without abnormal findings: Secondary | ICD-10-CM

## 2021-08-26 LAB — COMPREHENSIVE METABOLIC PANEL
ALT: 10 IU/L (ref 0–32)
AST: 13 IU/L (ref 0–40)
Albumin/Globulin Ratio: 1.7 (ref 1.2–2.2)
Albumin: 3.9 g/dL (ref 3.8–4.9)
Alkaline Phosphatase: 102 IU/L (ref 44–121)
BUN/Creatinine Ratio: 18 (ref 9–23)
BUN: 13 mg/dL (ref 6–24)
Bilirubin Total: 0.4 mg/dL (ref 0.0–1.2)
CO2: 25 mmol/L (ref 20–29)
Calcium: 9.1 mg/dL (ref 8.7–10.2)
Chloride: 105 mmol/L (ref 96–106)
Creatinine, Ser: 0.72 mg/dL (ref 0.57–1.00)
Globulin, Total: 2.3 g/dL (ref 1.5–4.5)
Glucose: 92 mg/dL (ref 70–99)
Potassium: 4.7 mmol/L (ref 3.5–5.2)
Sodium: 142 mmol/L (ref 134–144)
Total Protein: 6.2 g/dL (ref 6.0–8.5)
eGFR: 99 mL/min/{1.73_m2} (ref >59)

## 2021-08-26 LAB — HEMOGLOBIN A1C
Est. average glucose Bld gHb Est-mCnc: 100 mg/dL
Hgb A1c MFr Bld: 5.1 % (ref 4.8–5.6)

## 2021-08-26 LAB — CBC WITH DIFFERENTIAL/PLATELET
Basophils Absolute: 0.1 10*3/uL (ref 0.0–0.2)
Basos: 1 %
EOS (ABSOLUTE): 0.3 10*3/uL (ref 0.0–0.4)
Eos: 3 %
Hematocrit: 40.5 % (ref 34.0–46.6)
Hemoglobin: 13.2 g/dL (ref 11.1–15.9)
Immature Grans (Abs): 0 10*3/uL (ref 0.0–0.1)
Immature Granulocytes: 0 %
Lymphocytes Absolute: 1.4 10*3/uL (ref 0.7–3.1)
Lymphs: 19 %
MCH: 27.8 pg (ref 26.6–33.0)
MCHC: 32.6 g/dL (ref 31.5–35.7)
MCV: 85 fL (ref 79–97)
Monocytes Absolute: 0.6 10*3/uL (ref 0.1–0.9)
Monocytes: 8 %
Neutrophils Absolute: 5.2 10*3/uL (ref 1.4–7.0)
Neutrophils: 69 %
Platelets: 256 10*3/uL (ref 150–450)
RBC: 4.74 x10E6/uL (ref 3.77–5.28)
RDW: 12.8 % (ref 11.7–15.4)
WBC: 7.6 10*3/uL (ref 3.4–10.8)

## 2021-08-26 LAB — TSH: TSH: 1.7 u[IU]/mL (ref 0.450–4.500)

## 2021-08-26 LAB — VITAMIN D 25 HYDROXY (VIT D DEFICIENCY, FRACTURES): Vit D, 25-Hydroxy: 48.1 ng/mL (ref 30.0–100.0)

## 2021-08-26 LAB — LIPID PANEL
Chol/HDL Ratio: 3.6 ratio (ref 0.0–4.4)
Cholesterol, Total: 161 mg/dL (ref 100–199)
HDL: 45 mg/dL (ref >39)
LDL Chol Calc (NIH): 92 mg/dL (ref 0–99)
Triglycerides: 138 mg/dL (ref 0–149)
VLDL Cholesterol Cal: 24 mg/dL (ref 5–40)

## 2021-10-25 ENCOUNTER — Other Ambulatory Visit: Payer: Self-pay | Admitting: Physician Assistant

## 2021-10-25 DIAGNOSIS — Z1231 Encounter for screening mammogram for malignant neoplasm of breast: Secondary | ICD-10-CM

## 2021-11-23 ENCOUNTER — Ambulatory Visit
Admission: RE | Admit: 2021-11-23 | Discharge: 2021-11-23 | Disposition: A | Discharge status: Home / Self Care | Payer: Commercial Managed Care - PPO | Source: Ambulatory Visit | Attending: Physician Assistant | Admitting: Physician Assistant

## 2021-11-23 DIAGNOSIS — Z1231 Encounter for screening mammogram for malignant neoplasm of breast: Secondary | ICD-10-CM

## 2021-12-22 ENCOUNTER — Encounter (INDEPENDENT_AMBULATORY_CARE_PROVIDER_SITE_OTHER): Payer: Commercial Managed Care - PPO | Admitting: Family Medicine

## 2022-02-15 ENCOUNTER — Ambulatory Visit (INDEPENDENT_AMBULATORY_CARE_PROVIDER_SITE_OTHER): Payer: Commercial Managed Care - PPO | Admitting: Family Medicine

## 2022-02-15 DIAGNOSIS — T7840XD Allergy, unspecified, subsequent encounter: Secondary | ICD-10-CM

## 2022-02-15 DIAGNOSIS — T7840XA Allergy, unspecified, initial encounter: Secondary | ICD-10-CM

## 2022-02-15 NOTE — Patient Instructions (Signed)
You're doing well.  Consider Shingles vaccine.  Follow up annually.

## 2022-02-16 NOTE — Assessment & Plan Note (Signed)
Patient endorses continued weight loss.  States that she has follow-up with bariatric surgery.

## 2022-02-16 NOTE — Assessment & Plan Note (Signed)
Continue Flonase and Allegra.

## 2022-02-16 NOTE — Progress Notes (Signed)
Subjective:  Patient ID: Abigail Cruz, female    DOB: 08-Dec-1965  Age: 57 y.o. MRN: [id number]  CC: Chief Complaint  Patient presents with   Establish Care    No problems or concern    HPI:  57 year old female with morbid obesity status post sleeve gastrectomy, allergies, history of nephrolithiasis presents to establish care.  Patient states that she is doing well.  She is losing weight.  Most recent lab work was normal.  Patient does not take any prescription medications.  She is using over-the-counter Allegra and Flonase for allergies.  In regards to her preventative health care, she declines COVID-19 vaccination.  Declines flu vaccine as well.  Will discuss shingles vaccination.  Patient Active Problem List   Diagnosis Date Noted   Morbid obesity (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   Allergies 08/11/2015   h/o Nephrolithiasis  08/11/2015   Vitamin D deficiency 08/11/2015    Social Hx   Social History   Socioeconomic History   Marital status: Married    Spouse name: Not on file   Number of children: Not on file   Years of education: Not on file   Highest education level: Not on file  Occupational History   Not on file  Tobacco Use   Smoking status: Never    Passive exposure: Past   Smokeless tobacco: Never  Vaping Use   Vaping Use: Never used  Substance and Sexual Activity   Alcohol use: No   Drug use: No   Sexual activity: Yes    Partners: Male    Birth control/protection: Surgical    Comment: btl  Other Topics Concern   Not on file  Social History Narrative   Not on file   Social Determinants of Health   Financial Resource Strain: Not on file  Food Insecurity: Not on file  Transportation Needs: Not on file  Physical Activity: Not on file  Stress: Not on file  Social Connections: Not on file    Review of Systems  Constitutional: Negative.   Respiratory: Negative.    Cardiovascular: Negative.      Objective:  BP 137/81   Ht '5\' 2"'$  (1.575 m)   Wt 275 lb 3.2 oz (124.8 kg)   BMI 50.33 kg/m      02/15/2022    2:27 PM 08/24/2021    1:17 PM 03/10/2021    2:59 PM  BP/Weight  Systolic BP 071 219 758  Diastolic BP 81 76 96  Wt. (Lbs) 275.2 282   BMI 50.33 kg/m2 51.58 kg/m2     Physical Exam Vitals and nursing note reviewed.  Constitutional:      General: She is not in acute distress.    Appearance: Normal appearance. She is obese.  HENT:     Head: Normocephalic and atraumatic.  Eyes:     General:        Right eye: No discharge.        Left eye: No discharge.     Conjunctiva/sclera: Conjunctivae normal.  Cardiovascular:     Rate and Rhythm: Normal rate and regular rhythm.  Pulmonary:     Effort: Pulmonary effort is normal.     Breath sounds: Normal breath sounds. No wheezing, rhonchi or rales.  Neurological:     Mental Status: She is alert.  Psychiatric:        Mood and Affect: Mood normal.        Behavior: Behavior normal.     Lab  Results  Component Value Date   WBC 7.6 08/25/2021   HGB 13.2 08/25/2021   HCT 40.5 08/25/2021   PLT 256 08/25/2021   GLUCOSE 92 08/25/2021   CHOL 161 08/25/2021   TRIG 138 08/25/2021   HDL 45 08/25/2021   LDLDIRECT 97 03/31/2020   LDLCALC 92 08/25/2021   ALT 10 08/25/2021   AST 13 08/25/2021   NA 142 08/25/2021   K 4.7 08/25/2021   CL 105 08/25/2021   CREATININE 0.72 08/25/2021   BUN 13 08/25/2021   CO2 25 08/25/2021   TSH 1.700 08/25/2021   HGBA1C 5.1 08/25/2021     Assessment & Plan:   Problem List Items Addressed This Visit       Other   Allergies    Continue Flonase and Allegra.      Morbid obesity ([place]) - Primary    Patient endorses continued weight loss.  States that she has follow-up with bariatric surgery.       Follow-up:  Return in about 1 year (around 02/16/2023).  [place]

## 2022-08-02 ENCOUNTER — Ambulatory Visit (HOSPITAL_COMMUNITY)
Admission: RE | Admit: 2022-08-02 | Discharge: 2022-08-02 | Disposition: A | Discharge status: Home / Self Care | Payer: Commercial Managed Care - PPO | Source: Ambulatory Visit | Attending: Family Medicine | Admitting: Family Medicine

## 2022-08-02 ENCOUNTER — Ambulatory Visit (INDEPENDENT_AMBULATORY_CARE_PROVIDER_SITE_OTHER): Payer: Commercial Managed Care - PPO | Admitting: Family Medicine

## 2022-08-02 ENCOUNTER — Encounter: Payer: Self-pay | Admitting: Family Medicine

## 2022-08-02 DIAGNOSIS — I10 Essential (primary) hypertension: Secondary | ICD-10-CM | POA: Diagnosis not present

## 2022-08-02 DIAGNOSIS — M2550 Pain in unspecified joint: Secondary | ICD-10-CM | POA: Insufficient documentation

## 2022-08-02 DIAGNOSIS — R519 Headache, unspecified: Secondary | ICD-10-CM | POA: Diagnosis not present

## 2022-08-02 DIAGNOSIS — R739 Hyperglycemia, unspecified: Secondary | ICD-10-CM

## 2022-08-02 NOTE — Assessment & Plan Note (Signed)
Etiology unclear.  Labs today for further evaluation.

## 2022-08-02 NOTE — Assessment & Plan Note (Signed)
BP improved on recheck here today.  Etiology uncertain at this time.  Obtaining labs for further evaluation.  If pressures continue to be elevated and uncontrolled will likely need workup for secondary hypertension.

## 2022-08-02 NOTE — Patient Instructions (Signed)
Labs today.  Rest.  We will call with results.  Follow up in 1 week.

## 2022-08-02 NOTE — Assessment & Plan Note (Signed)
Given age, this is concerning.  CT scan of the head today for further evaluation.

## 2022-08-02 NOTE — Progress Notes (Signed)
Subjective:  Patient ID: Abigail Cruz, female    DOB: 1965/03/31  Age: 57 y.o. MRN: [id number]  CC: Chief Complaint  Patient presents with   Blood Pressure issuses     PT states she has had headaches everyday.  PT states she has had a blood pressure as high as 213/130 and then a drop to 109/50. Pt is going through Menopause and would like to discuss things to possibly help because the symptoms ar so severe.     HPI:  57 year old female presents for evaluation of multiple issues today.  Patient reports that she feels like she is going through menopause.  She has not had a menstrual cycle in 6 months.  She has been having hot flashes.  She also reports that she has recently been having frequent headaches which is uncommon for her.  Headaches are bitemporal.  She wakes up in the morning with a headache and these also interfere with her ability to sleep.  Over the past 1.5 weeks she has had consistent headaches and thus has taken her blood pressure.  She states that her blood pressure has been markedly elevated and labile.  Has been as high as 230/130 and as low as 109/50.  Patient very concerned about her blood pressures.  She also reports ongoing cramping of her hands and lower extremities.  She also reports arthralgias with hip pain, back pain, and knee pain.  Denies any new stressors.  No new supplements.   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   Allergies 08/11/2015   h/o Nephrolithiasis  08/11/2015   Vitamin D deficiency 08/11/2015    Social Hx   Social History   Socioeconomic History   Marital status: Married    Spouse name: Not on file   Number of children: Not on file   Years of education: Not on file   Highest education level: Not on file  Occupational History   Not on  file  Tobacco Use   Smoking status: Never    Passive exposure: Past   Smokeless tobacco: Never  Vaping Use   Vaping status: Never Used  Substance and Sexual Activity   Alcohol use: No   Drug use: No   Sexual activity: Yes    Partners: Male    Birth control/protection: Surgical    Comment: btl  Other Topics Concern   Not on file  Social History Narrative   Not on file   Social Determinants of Health   Financial Resource Strain: Not on file  Food Insecurity: Not on file  Transportation Needs: Not on file  Physical Activity: Not on file  Stress: Not on file  Social Connections: Not on file    Review of Systems Per HPI  Objective:  BP 120/81   Pulse 81   Temp 97.7 F (36.5 C)   Ht 5\' 2"  (1.575 m)   Wt 276 lb 6.4 oz (125.4 kg)   SpO2 99%   BMI 50.55 kg/m      08/02/2022   10:47 AM 08/02/2022   10:02 AM 02/15/2022    2:27 PM  BP/Weight  Systolic BP 120 168 137  Diastolic BP 81 86 81  Wt. (Lbs)  276.4 275.2  BMI  50.55 kg/m2 50.33 kg/m2    Physical Exam Constitutional:  Appearance: Normal appearance. She is obese.  HENT:     Head: Normocephalic and atraumatic.  Eyes:     Conjunctiva/sclera: Conjunctivae normal.     Pupils: Pupils are equal, round, and reactive to light.  Cardiovascular:     Rate and Rhythm: Normal rate and regular rhythm.  Pulmonary:     Effort: Pulmonary effort is normal.     Breath sounds: Normal breath sounds. No wheezing, rhonchi or rales.  Neurological:     Mental Status: She is alert.  Psychiatric:        Mood and Affect: Mood normal.        Behavior: Behavior normal.     Lab Results  Component Value Date   WBC 7.6 08/25/2021   HGB 13.2 08/25/2021   HCT 40.5 08/25/2021   PLT 256 08/25/2021   GLUCOSE 92 08/25/2021   CHOL 161 08/25/2021   TRIG 138 08/25/2021   HDL 45 08/25/2021   LDLDIRECT 97 03/31/2020   LDLCALC 92 08/25/2021   ALT 10 08/25/2021   AST 13 08/25/2021   NA 142 08/25/2021   K 4.7 08/25/2021   CL  105 08/25/2021   CREATININE 0.72 08/25/2021   BUN 13 08/25/2021   CO2 25 08/25/2021   TSH 1.700 08/25/2021   HGBA1C 5.1 08/25/2021     Assessment & Plan:   Problem List Items Addressed This Visit       Cardiovascular and Mediastinum   Hypertension    BP improved on recheck here today.  Etiology uncertain at this time.  Obtaining labs for further evaluation.  If pressures continue to be elevated and uncontrolled will likely need workup for secondary hypertension.      Relevant Orders   CMP14+EGFR   Microalbumin / creatinine urine ratio     Other   Arthralgia    Etiology unclear.  Labs today for further evaluation.      Relevant Orders   CBC   TSH   Sedimentation Rate   New onset of headaches after age 26    Given age, this is concerning.  CT scan of the head today for further evaluation.      Relevant Orders   CT HEAD WO CONTRAST ( )   Other Visit Diagnoses     Blood glucose elevated       Relevant Orders   Hemoglobin A1c       Follow-up:  Return in about 1 week (around 08/09/2022).  Everlene Other DO [facility]

## 2022-08-03 LAB — CMP14+EGFR
ALT: 25 IU/L (ref 0–32)
AST: 33 IU/L (ref 0–40)
Albumin: 4 g/dL (ref 3.8–4.9)
Alkaline Phosphatase: 109 IU/L (ref 44–121)
BUN/Creatinine Ratio: 21 (ref 9–23)
BUN: 15 mg/dL (ref 6–24)
Bilirubin Total: 0.4 mg/dL (ref 0.0–1.2)
CO2: 24 mmol/L (ref 20–29)
Calcium: 9.2 mg/dL (ref 8.7–10.2)
Chloride: 102 mmol/L (ref 96–106)
Creatinine, Ser: 0.7 mg/dL (ref 0.57–1.00)
Globulin, Total: 2.6 g/dL (ref 1.5–4.5)
Glucose: 100 mg/dL — ABNORMAL HIGH (ref 70–99)
Potassium: 4.8 mmol/L (ref 3.5–5.2)
Sodium: 139 mmol/L (ref 134–144)
Total Protein: 6.6 g/dL (ref 6.0–8.5)
eGFR: 102 mL/min/{1.73_m2} (ref >59)

## 2022-08-03 LAB — CBC
Hematocrit: 41.4 % (ref 34.0–46.6)
Hemoglobin: 13.3 g/dL (ref 11.1–15.9)
MCH: 28.3 pg (ref 26.6–33.0)
MCHC: 32.1 g/dL (ref 31.5–35.7)
MCV: 88 fL (ref 79–97)
Platelets: 295 10*3/uL (ref 150–450)
RBC: 4.7 x10E6/uL (ref 3.77–5.28)
RDW: 13.4 % (ref 11.7–15.4)
WBC: 3.6 10*3/uL (ref 3.4–10.8)

## 2022-08-03 LAB — HEMOGLOBIN A1C
Est. average glucose Bld gHb Est-mCnc: 108 mg/dL
Hgb A1c MFr Bld: 5.4 % (ref 4.8–5.6)

## 2022-08-03 LAB — MICROALBUMIN / CREATININE URINE RATIO
Creatinine, Urine: 142.3 mg/dL
Microalb/Creat Ratio: 6 mg/g creat (ref 0–29)
Microalbumin, Urine: 8.3 ug/mL

## 2022-08-03 LAB — TSH: TSH: 1.45 u[IU]/mL (ref 0.450–4.500)

## 2022-08-03 LAB — SEDIMENTATION RATE: Sed Rate: 13 mm/hr (ref 0–40)

## 2022-08-04 ENCOUNTER — Other Ambulatory Visit: Payer: Self-pay | Admitting: Family Medicine

## 2022-08-04 ENCOUNTER — Ambulatory Visit: Payer: Commercial Managed Care - PPO

## 2022-08-04 DIAGNOSIS — I10 Essential (primary) hypertension: Secondary | ICD-10-CM

## 2022-08-07 LAB — METANEPHRINES, PLASMA
Metanephrine, Free: <25 pg/mL (ref 0.0–88.0)
Normetanephrine, Free: 69.7 pg/mL (ref 0.0–244.0)

## 2022-08-08 ENCOUNTER — Telehealth: Payer: Self-pay

## 2022-08-08 NOTE — Telephone Encounter (Signed)
Pt is calling seen Dr Adriana Simas this past week come in for a nurse visit and he seen her he was going to be sent a referral for ultra sound upper body. She has not heard from no one on this. Pt has heard from Dr Fritzi Mandes she has appt with him today. They was going have to get this type of ultra sound done in [place]  [phone]

## 2022-08-08 NOTE — Telephone Encounter (Signed)
Abigail Sams, DO     I ordered a vascular US. Please find out why this has not been scheduled.

## 2022-08-09 ENCOUNTER — Other Ambulatory Visit: Payer: Self-pay | Admitting: Family Medicine

## 2022-08-09 DIAGNOSIS — I10 Essential (primary) hypertension: Secondary | ICD-10-CM

## 2022-08-09 NOTE — Telephone Encounter (Signed)
Message sent to referral coordinator for follow up

## 2022-08-09 NOTE — Telephone Encounter (Signed)
Per referral coordinator: I have her scheduled at 10am 08/16/22. The order had to be changed and it has been sent to Dr Adriana Simas to sign off. If he orders a [place] and not VASC US it will come to the work [place] and he gets the same order we just see it and it will be scheduled the same day in most cases that her orders it. If it is sitting in the active orders I do not see it. They are working on getting this to come to the [facility] but I do not see it now. If he enters something, If you send me a note I can pull it. Thanks, Have a Great Day. I am calling the patient now.

## 2022-08-10 LAB — CATECHOLAMINES, FRACTIONATED, URINE, 24 HOUR

## 2022-08-10 LAB — METANEPHRINES, URINE, 24 HOUR
Metaneph Total, Ur: 58 ug/L
Normetanephrine, Ur: 495 ug/L

## 2022-08-11 ENCOUNTER — Encounter (HOSPITAL_COMMUNITY): Payer: Self-pay | Admitting: *Deleted

## 2022-08-16 ENCOUNTER — Ambulatory Visit (HOSPITAL_COMMUNITY)
Admission: RE | Admit: 2022-08-16 | Discharge: 2022-08-16 | Disposition: A | Discharge status: Home / Self Care | Payer: Commercial Managed Care - PPO | Source: Ambulatory Visit | Attending: Family Medicine | Admitting: Family Medicine

## 2022-08-16 ENCOUNTER — Other Ambulatory Visit (HOSPITAL_COMMUNITY): Payer: Self-pay | Admitting: Nephrology

## 2022-08-16 DIAGNOSIS — G458 Other transient cerebral ischemic attacks and related syndromes: Secondary | ICD-10-CM

## 2022-08-16 DIAGNOSIS — I10 Essential (primary) hypertension: Secondary | ICD-10-CM | POA: Insufficient documentation

## 2022-08-16 DIAGNOSIS — I1A0 Resistant hypertension: Secondary | ICD-10-CM

## 2022-08-23 ENCOUNTER — Other Ambulatory Visit: Payer: Self-pay | Admitting: Family Medicine

## 2022-08-23 MED ORDER — NITROFURANTOIN MONOHYD MACRO 100 MG PO CAPS: 100.0000 mg | ORAL_CAPSULE | Freq: Two times a day (BID) | ORAL | 0 refills | Status: DC

## 2022-08-24 ENCOUNTER — Other Ambulatory Visit (HOSPITAL_COMMUNITY): Payer: Self-pay | Admitting: Nephrology

## 2022-08-24 ENCOUNTER — Ambulatory Visit (HOSPITAL_BASED_OUTPATIENT_CLINIC_OR_DEPARTMENT_OTHER)
Admission: RE | Admit: 2022-08-24 | Discharge: 2022-08-24 | Disposition: A | Discharge status: Home / Self Care | Payer: Commercial Managed Care - PPO | Source: Ambulatory Visit | Attending: Nephrology | Admitting: Nephrology

## 2022-08-24 DIAGNOSIS — I1A0 Resistant hypertension: Secondary | ICD-10-CM

## 2022-08-24 DIAGNOSIS — G458 Other transient cerebral ischemic attacks and related syndromes: Secondary | ICD-10-CM

## 2022-08-24 MED ORDER — IOHEXOL 350 MG/ML SOLN
100.0000 mL | Freq: Once | INTRAVENOUS | Status: AC | PRN
  Administered 2022-08-24: 100 mL via INTRAVENOUS

## 2022-09-27 ENCOUNTER — Institutional Professional Consult (permissible substitution): Payer: Commercial Managed Care - PPO | Admitting: Adult Health

## 2022-11-08 ENCOUNTER — Institutional Professional Consult (permissible substitution): Payer: Commercial Managed Care - PPO | Admitting: Adult Health

## 2022-11-17 ENCOUNTER — Encounter: Payer: Self-pay | Admitting: Student

## 2022-11-18 ENCOUNTER — Ambulatory Visit (INDEPENDENT_AMBULATORY_CARE_PROVIDER_SITE_OTHER): Payer: Commercial Managed Care - PPO | Admitting: Nurse Practitioner

## 2022-11-18 ENCOUNTER — Encounter: Payer: Self-pay | Admitting: Nurse Practitioner

## 2022-11-18 VITALS — BP 130/70 | HR 61 | Ht 62.0 in | Wt 275.2 lb

## 2022-11-18 DIAGNOSIS — R0683 Snoring: Secondary | ICD-10-CM | POA: Diagnosis not present

## 2022-11-18 DIAGNOSIS — G4719 Other hypersomnia: Secondary | ICD-10-CM | POA: Insufficient documentation

## 2022-11-18 NOTE — Patient Instructions (Signed)
Given your symptoms, I am concerned that you may have sleep disordered breathing with sleep apnea. You will need a sleep study for further evaluation. Someone will contact you to schedule this.   We discussed how untreated sleep apnea puts an individual at risk for cardiac arrhthymias, pulm HTN, DM, stroke and increases their risk for daytime accidents. We also briefly reviewed treatment options including weight loss, side sleeping position, oral appliance, CPAP therapy or referral to ENT for possible surgical options  Use caution when driving and pull over if you become sleepy.  Follow up in 6-8 weeks with Abigail Gerri Acre,NP to go over sleep study results, or sooner, if needed

## 2022-11-18 NOTE — Assessment & Plan Note (Signed)
She has snoring, excessive daytime sleepiness, nocturnal apneic events, morning headaches, restless sleep. BMI 50. Given this,  I am concerned she could have sleep disordered breathing with obstructive sleep apnea. She will need sleep study for further evaluation.    - discussed how weight can impact sleep and risk for sleep disordered breathing - discussed options to assist with weight loss: combination of diet modification, cardiovascular and strength training exercises   - had an extensive discussion regarding the adverse health consequences related to untreated sleep disordered breathing - specifically discussed the risks for hypertension, coronary artery disease, cardiac dysrhythmias, cerebrovascular disease, and diabetes - lifestyle modification discussed   - discussed how sleep disruption can increase risk of accidents, particularly when driving - safe driving practices were discussed  Patient Instructions  Given your symptoms, I am concerned that you may have sleep disordered breathing with sleep apnea. You will need a sleep study for further evaluation. Someone will contact you to schedule this.   We discussed how untreated sleep apnea puts an individual at risk for cardiac arrhthymias, pulm HTN, DM, stroke and increases their risk for daytime accidents. We also briefly reviewed treatment options including weight loss, side sleeping position, oral appliance, CPAP therapy or referral to ENT for possible surgical options  Use caution when driving and pull over if you become sleepy.  Follow up in 6-8 weeks with Katie Arlyce Circle,NP to go over sleep study results, or sooner, if needed

## 2022-11-18 NOTE — Assessment & Plan Note (Signed)
BMI 50. Continued healthy weight loss encouraged

## 2022-11-18 NOTE — Assessment & Plan Note (Signed)
See above

## 2022-11-18 NOTE — Progress Notes (Signed)
@Patient  ID: Abigail Cruz, female    DOB: 12-10-1965, 57 y.o.   MRN: [id number]  Chief Complaint  Patient presents with   Consult    Pt is here for Sleep Consult.     Referring provider: Johnn Hai, NP  HPI: 57 year old female, never smoker referred for sleep consult.  Past medical history significant for hypertension, allergies, obesity status post gastric sleeve, headaches, vitamin D deficiency.  TEST/EVENTS:   11/18/2022: Today - sleep consult Discussed the use of AI scribe software for clinical note transcription with the patient, who gave verbal consent to proceed.  History of Present Illness   The patient, with a history of gastric sleeve surgery and significant weight loss, presents for a sleep consult due to concerns of restless sleep and subsequent daytime sleepiness. She reports frequent awakenings during the night and despite waking up promptly in the morning, she experiences fatigue throughout the day. The patient's partner has observed snoring and episodes of apnea during sleep. The patient also reports waking up with a dry mouth and occasional headaches in the middle of the night. Denies any sleep parasomnias/paralysis or drowsy driving. No hx of narcolepsy or cataplexy.   The patient's sleep onset varies, with some nights involving immediate sleep and others involving up to an hour of restlessness. During these restless periods, the patient may resort to over-the-counter sleep aids such as Tylenol PM. The patient also reports using her phone to distract her mind when she is unable to sleep. Goes to bed between [phone]. Wakes multiple times a day. Gets up at 530 am. No O2 use. Never had a previous sleep study.  Despite significant weight loss of 80 pounds over the past two years following gastric sleeve surgery, the patient has not noticed any improvement in her sleep symptoms. In fact, she believes her symptoms may have worsened. The patient's lifestyle involves  high-stress work, which she believes may contribute to her sleep issues.  The patient's caffeine and alcohol intake is minimal, with one cup of coffee in the morning and occasional alcohol consumption. She is not currently on any prescribed sleep medications. Never smoker. Lives with her husband. Works as a Pensions consultant at [employer]. No significant family history reported.   Epworth 4       Allergies  Allergen Reactions   Bee Venom Anaphylaxis   Iodine Dermatitis and Rash   Other Anaphylaxis    ANT bites   Latex Other (See Comments)    blisters   Tape     Adhesives blisters   Shellfish-Derived Products Palpitations and Rash    Immunization History  Administered Date(s) Administered   Pneumococcal Polysaccharide-23 01/18/2008   Tdap 12/12/2018    Past Medical History:  Diagnosis Date   Abnormal Pap smear of cervix    in her 20's   Arthritis    hands   Asthma    triggered by smoke   History of kidney stones    Hypertension    Pneumonia     Tobacco History: Social History   Tobacco Use  Smoking Status Never   Passive exposure: Past  Smokeless Tobacco Never   Counseling given: Not Answered   Outpatient Medications Prior to Visit  Medication Sig Dispense Refill   fexofenadine (ALLEGRA) 180 MG tablet Take 180 mg by mouth daily as needed for allergies.     fluticasone (FLONASE) 50 MCG/ACT nasal spray 1 spray each nostril following sinus rinses twice daily (Patient taking differently: Place 1 spray into both  nostrils daily as needed (allergies.).) 16 g 11   nitrofurantoin, macrocrystal-monohydrate, (MACROBID) 100 MG capsule Take 1 capsule (100 mg total) by mouth 2 (two) times daily. 14 capsule 0   No facility-administered medications prior to visit.     Review of Systems:   Constitutional: No night sweats, fevers, chills, or lassitude. +fatigue, weight loss HEENT: No difficulty swallowing, tooth/dental problems, or sore throat. No sneezing, itching, ear ache,  nasal congestion, or post nasal drip. +occasional headaches, dry mouth CV:  No chest pain, orthopnea, PND, swelling in lower extremities, anasarca, dizziness, palpitations, syncope Resp: +snoring, witnessed apneas. No shortness of breath with exertion or at rest. No excess mucus or change in color of mucus. No productive or non-productive. No hemoptysis. No wheezing.  No chest wall deformity GI:  No heartburn, indigestion GU: No nocturia Skin: No rash, lesions, ulcerations MSK:  +chronic joint stiffness Neuro: No dizziness or lightheadedness.  Psych: No depression or anxiety. Mood stable. +sleep disturbance    Physical Exam:  BP 130/70 (BP Location: Right Arm, Cuff Size: Large)   Pulse 61   Ht 5\' 2"  (1.575 m)   Wt 275 lb 3.2 oz (124.8 kg)   SpO2 96%   BMI 50.33 kg/m   GEN: Pleasant, interactive, well-kempt; morbidly obese; in no acute distress HEENT:  Normocephalic and atraumatic. PERRLA. Sclera white. Nasal turbinates pink, moist and patent bilaterally. No rhinorrhea present. Oropharynx pink and moist, without exudate or edema. No lesions, ulcerations, or postnasal drip. Mallampati III/IV NECK:  Supple w/ fair ROM. No JVD present. Normal carotid impulses w/o bruits. Thyroid symmetrical with no goiter or nodules palpated. No lymphadenopathy.   CV: RRR, no m/r/g, no peripheral edema. Pulses intact, +2 bilaterally. No cyanosis, pallor or clubbing. PULMONARY:  Unlabored, regular breathing. Clear bilaterally A&P w/o wheezes/rales/rhonchi. No accessory muscle use.  GI: BS present and normoactive. Soft, non-tender to palpation. No organomegaly or masses detected.  MSK: No erythema, warmth or tenderness. Cap refil <2 sec all extrem. No deformities or joint swelling noted.  Neuro: A/Ox3. No focal deficits noted.   Skin: Warm, no lesions or rashe Psych: Normal affect and behavior. Judgement and thought content appropriate.     Lab Results:  CBC    Component Value Date/Time   WBC 3.6  08/02/2022 1144   WBC 10.1 01/05/2021 0436   RBC 4.70 08/02/2022 1144   RBC 4.24 01/05/2021 0436   HGB 13.3 08/02/2022 1144   HCT 41.4 08/02/2022 1144   PLT 295 08/02/2022 1144   MCV 88 08/02/2022 1144   MCH 28.3 08/02/2022 1144   MCH 28.8 01/05/2021 0436   MCHC 32.1 08/02/2022 1144   MCHC 33.1 01/05/2021 0436   RDW 13.4 08/02/2022 1144   LYMPHSABS 1.4 08/25/2021 0846   MONOABS 0.9 01/05/2021 0436   EOSABS 0.3 08/25/2021 0846   BASOSABS 0.1 08/25/2021 0846    BMET    Component Value Date/Time   NA 139 08/02/2022 1144   K 4.8 08/02/2022 1144   CL 102 08/02/2022 1144   CO2 24 08/02/2022 1144   GLUCOSE 100 (H) 08/02/2022 1144   GLUCOSE 137 (H) 01/05/2021 0436   BUN 15 08/02/2022 1144   CREATININE 0.70 08/02/2022 1144   CREATININE 0.60 08/11/2015 0913   CALCIUM 9.2 08/02/2022 1144   GFRNONAA >60 01/05/2021 0436   GFRNONAA >89 08/11/2015 0913   GFRAA 121 09/12/2019 0802   GFRAA >89 08/11/2015 0913    BNP No results found for: "BNP"   Imaging:  No results  found.  Administration History     None           No data to display          No results found for: "NITRICOXIDE"      Assessment & Plan:   Excessive daytime sleepiness She has snoring, excessive daytime sleepiness, nocturnal apneic events, morning headaches, restless sleep. BMI 50. Given this,  I am concerned she could have sleep disordered breathing with obstructive sleep apnea. She will need sleep study for further evaluation.    - discussed how weight can impact sleep and risk for sleep disordered breathing - discussed options to assist with weight loss: combination of diet modification, cardiovascular and strength training exercises   - had an extensive discussion regarding the adverse health consequences related to untreated sleep disordered breathing - specifically discussed the risks for hypertension, coronary artery disease, cardiac dysrhythmias, cerebrovascular disease, and diabetes -  lifestyle modification discussed   - discussed how sleep disruption can increase risk of accidents, particularly when driving - safe driving practices were discussed  Patient Instructions  Given your symptoms, I am concerned that you may have sleep disordered breathing with sleep apnea. You will need a sleep study for further evaluation. Someone will contact you to schedule this.   We discussed how untreated sleep apnea puts an individual at risk for cardiac arrhthymias, pulm HTN, DM, stroke and increases their risk for daytime accidents. We also briefly reviewed treatment options including weight loss, side sleeping position, oral appliance, CPAP therapy or referral to ENT for possible surgical options  Use caution when driving and pull over if you become sleepy.  Follow up in 6-8 weeks with Florentina Addison Eline Geng,NP to go over sleep study results, or sooner, if needed     Morbid obesity (HCC) BMI 50. Continued healthy weight loss encouraged  Loud snoring See above   I spent 35 minutes of dedicated to the care of this patient on the date of this encounter to include pre-visit review of records, face-to-face time with the patient discussing conditions above, post visit ordering of testing, clinical documentation with the electronic health record, making appropriate referrals as documented, and communicating necessary findings to members of the patients care team.  Noemi Chapel, NP 11/18/2022  Pt aware and understands NP's role.

## 2023-01-06 ENCOUNTER — Institutional Professional Consult (permissible substitution): Payer: Commercial Managed Care - PPO | Admitting: Adult Health

## 2023-01-14 DIAGNOSIS — R0683 Snoring: Secondary | ICD-10-CM

## 2023-01-14 DIAGNOSIS — G4719 Other hypersomnia: Secondary | ICD-10-CM

## 2023-01-20 ENCOUNTER — Telehealth: Payer: Commercial Managed Care - PPO | Admitting: Nurse Practitioner

## 2023-02-17 ENCOUNTER — Encounter: Payer: Self-pay | Admitting: Nurse Practitioner

## 2023-02-17 ENCOUNTER — Telehealth (INDEPENDENT_AMBULATORY_CARE_PROVIDER_SITE_OTHER): Payer: Commercial Managed Care - PPO | Admitting: Nurse Practitioner

## 2023-02-17 DIAGNOSIS — G4733 Obstructive sleep apnea (adult) (pediatric): Secondary | ICD-10-CM | POA: Diagnosis not present

## 2023-02-17 NOTE — Assessment & Plan Note (Signed)
Severe obstructive sleep apnea with AHI 47.9/h.  Reviewed risks of untreated severe sleep apnea and potential treatment options.  Shared decision to move forward with CPAP therapy given severity and symptoms.  Orders placed for auto CPAP 5-15 cmH2O, DreamWear full facemask or similar, heated humidity.  Educated on proper use/care of device.  Will try to avoid nasal mask given her history of sinusitis; although, not an absolute contraindication.  Did advise her to let me know if she has any difficulties with mask fit moving forward.  Re/benefits of CPAP reviewed.  Healthy weight loss encouraged.  Safe from practices reviewed.  Patient Instructions  Start to use CPAP 5-15 cmH2O every night, minimum of 4-6 hours a night.  Change equipment as directed. Wash your tubing with warm soap and water daily, hang to dry. Wash humidifier portion weekly. Use bottled, distilled water and change daily Be aware of reduced alertness and do not drive or operate heavy machinery if experiencing this or drowsiness.  Exercise encouraged, as tolerated. Healthy weight management discussed.  Avoid or decrease alcohol consumption and medications that make you more sleepy, if possible. Notify if persistent daytime sleepiness occurs even with consistent use of PAP therapy.   We discussed how untreated sleep apnea puts an individual at risk for cardiac arrhthymias, pulm HTN, DM, stroke and increases their risk for daytime accidents. We also briefly reviewed treatment options including weight loss, side sleeping position, oral appliance, CPAP therapy or referral to ENT for possible surgical options  Dreamwear full face mask, Evora full face mask or AirFit F40   Let me know if you haven't heard something in the 2-3 weeks about the CPAP   Change supplies... Every month Mask cushions and/or nasal pillows CPAP machine filters Every 3 months Mask frame (not including the headgear) CPAP tubing Every 6 months Mask headgear Chin  strap (if applicable) Humidifier water tub   Follow up in 10-12 weeks with Katie Dawn Convery,NP. Friday PM virtual clinic. If symptoms do not improve or worsen, please contact office for sooner follow up or seek emergency care.

## 2023-02-17 NOTE — Progress Notes (Signed)
Patient ID: Abigail Cruz, female     DOB: [birth year], 58 y.o.      MRN: [id number]  No chief complaint on file.   Virtual Visit via Video Note  I connected with Abigail Cruz on 02/17/23 at  4:00 PM EST by a video enabled telemedicine application and verified that I am speaking with the correct person using two identifiers.  Location: Patient: Home Provider: Office   I discussed the limitations of evaluation and management by telemedicine and the availability of in person appointments. The patient expressed understanding and agreed to proceed.  History of Present Illness: 58 year old female, never smoker followed for severe OSA.  Past medical history significant for hypertension, allergies, obesity status post gastric sleeve, headaches, vitamin D deficiency.   TEST/EVENTS:  01/14/2023 HST: AHI 47.9/h, SpO2 low 81%  11/18/2022: OV with Caasi Giglia NP The patient, with a history of gastric sleeve surgery and significant weight loss, presents for a sleep consult due to concerns of restless sleep and subsequent daytime sleepiness. She reports frequent awakenings during the night and despite waking up promptly in the morning, she experiences fatigue throughout the day. The patient's partner has observed snoring and episodes of apnea during sleep. The patient also reports waking up with a dry mouth and occasional headaches in the middle of the night. Denies any sleep parasomnias/paralysis or drowsy driving. No hx of narcolepsy or cataplexy.  The patient's sleep onset varies, with some nights involving immediate sleep and others involving up to an hour of restlessness. During these restless periods, the patient may resort to over-the-counter sleep aids such as Tylenol PM. The patient also reports using her phone to distract her mind when she is unable to sleep. Goes to bed between [phone] PM. Wakes multiple times a day. Gets up at 530 am. No O2 use. Never had a previous sleep study. Despite  significant weight loss of 80 pounds over the past two years following gastric sleeve surgery, the patient has not noticed any improvement in her sleep symptoms. In fact, she believes her symptoms may have worsened. The patient's lifestyle involves high-stress work, which she believes may contribute to her sleep issues. The patient's caffeine and alcohol intake is minimal, with one cup of coffee in the morning and occasional alcohol consumption. She is not currently on any prescribed sleep medications. Never smoker. Lives with her husband. Works as a Pensions consultant at [employer]. No significant family history reported. Epworth 4   02/17/2023: Today - follow up Patient presents today for follow-up to discuss home sleep study results which revealed severe sleep apnea.  She feels unchanged compared to her last visit.  She has restless sleep and associated daytime fatigue symptoms.  Wakes up throughout the night.  Husband has told her that she snores and stops breathing when she sleeps at night.  No issues with drowsy driving or sleep parasomnia/paralysis.  She wants to discuss treatment options.  She does want to ensure that she gets a mask that does not make her sinus symptoms worse if she goes on CPAP.  Allergies  Allergen Reactions   Bee Venom Anaphylaxis   Iodine Dermatitis and Rash   Other Anaphylaxis    ANT bites   Latex Other (See Comments)    blisters   Tape     Adhesives blisters   Shellfish-Derived Products Palpitations and Rash   Immunization History  Administered Date(s) Administered   Pneumococcal Polysaccharide-23 01/18/2008   Tdap 12/12/2018   Past Medical History:  Diagnosis  Date   Abnormal Pap smear of cervix    in her 20's   Arthritis    hands   Asthma    triggered by smoke   History of kidney stones    Hypertension    Pneumonia     Tobacco History: Social History   Tobacco Use  Smoking Status Never   Passive exposure: Past  Smokeless Tobacco Never   Counseling  given: Not Answered   Outpatient Medications Prior to Visit  Medication Sig Dispense Refill   fexofenadine (ALLEGRA) 180 MG tablet Take 180 mg by mouth daily as needed for allergies.     fluticasone (FLONASE) 50 MCG/ACT nasal spray 1 spray each nostril following sinus rinses twice daily (Patient taking differently: Place 1 spray into both nostrils daily as needed (allergies.).) 16 g 11   No facility-administered medications prior to visit.     Review of Systems:   Constitutional: No night sweats, fevers, chills, or lassitude. +fatigue, weight loss (intentional) HEENT: No difficulty swallowing, tooth/dental problems, or sore throat. No sneezing, itching, ear ache, nasal congestion, or post nasal drip. +occasional headaches, dry mouth CV:  No chest pain, orthopnea, PND, swelling in lower extremities, anasarca, dizziness, palpitations, syncope Resp: +snoring, witnessed apneas. No shortness of breath with exertion or at rest. No excess mucus or change in color of mucus. No productive or non-productive. No hemoptysis. No wheezing.  No chest wall deformity GI:  No heartburn, indigestion GU: No nocturia Skin: No rash, lesions, ulcerations MSK:  +chronic joint stiffness Neuro: No dizziness or lightheadedness.  Psych: No depression or anxiety. Mood stable. +sleep disturbance  Observations/Objective: Patient is well-developed, well-nourished in no acute distress.  Resting comfortably at home.  No labored breathing.  Speech is clear and coherent with logical content.  Patient is alert and oriented at baseline.   Assessment and Plan: Severe obstructive sleep apnea Severe obstructive sleep apnea with AHI 47.9/h.  Reviewed risks of untreated severe sleep apnea and potential treatment options.  Shared decision to move forward with CPAP therapy given severity and symptoms.  Orders placed for auto CPAP 5-15 cmH2O, DreamWear full facemask or similar, heated humidity.  Educated on proper use/care of  device.  Will try to avoid nasal mask given her history of sinusitis; although, not an absolute contraindication.  Did advise her to let me know if she has any difficulties with mask fit moving forward.  Re/benefits of CPAP reviewed.  Healthy weight loss encouraged.  Safe from practices reviewed.  Patient Instructions  Start to use CPAP 5-15 cmH2O every night, minimum of 4-6 hours a night.  Change equipment as directed. Wash your tubing with warm soap and water daily, hang to dry. Wash humidifier portion weekly. Use bottled, distilled water and change daily Be aware of reduced alertness and do not drive or operate heavy machinery if experiencing this or drowsiness.  Exercise encouraged, as tolerated. Healthy weight management discussed.  Avoid or decrease alcohol consumption and medications that make you more sleepy, if possible. Notify if persistent daytime sleepiness occurs even with consistent use of PAP therapy.   We discussed how untreated sleep apnea puts an individual at risk for cardiac arrhthymias, pulm HTN, DM, stroke and increases their risk for daytime accidents. We also briefly reviewed treatment options including weight loss, side sleeping position, oral appliance, CPAP therapy or referral to ENT for possible surgical options  Dreamwear full face mask, Evora full face mask or AirFit F40   Let me know if you haven't heard something  in the 2-3 weeks about the CPAP   Change supplies... Every month Mask cushions and/or nasal pillows CPAP machine filters Every 3 months Mask frame (not including the headgear) CPAP tubing Every 6 months Mask headgear Chin strap (if applicable) Humidifier water tub   Follow up in 10-12 weeks with Katie Dawnelle Warman,NP. Friday PM virtual clinic. If symptoms do not improve or worsen, please contact office for sooner follow up or seek emergency care.    Morbid obesity (HCC) Healthy weight loss encouraged. Reviewed correlation between obesity and OSA.     I discussed the assessment and treatment plan with the patient. The patient was provided an opportunity to ask questions and all were answered. The patient agreed with the plan and demonstrated an understanding of the instructions.   The patient was advised to call back or seek an in-person evaluation if the symptoms worsen or if the condition fails to improve as anticipated.  I provided 31 minutes of non-face-to-face time during this encounter.   Noemi Chapel, NP

## 2023-02-17 NOTE — Patient Instructions (Signed)
Start to use CPAP 5-15 cmH2O every night, minimum of 4-6 hours a night.  Change equipment as directed. Wash your tubing with warm soap and water daily, hang to dry. Wash humidifier portion weekly. Use bottled, distilled water and change daily Be aware of reduced alertness and do not drive or operate heavy machinery if experiencing this or drowsiness.  Exercise encouraged, as tolerated. Healthy weight management discussed.  Avoid or decrease alcohol consumption and medications that make you more sleepy, if possible. Notify if persistent daytime sleepiness occurs even with consistent use of PAP therapy.   We discussed how untreated sleep apnea puts an individual at risk for cardiac arrhthymias, pulm HTN, DM, stroke and increases their risk for daytime accidents. We also briefly reviewed treatment options including weight loss, side sleeping position, oral appliance, CPAP therapy or referral to ENT for possible surgical options  Dreamwear full face mask, Evora full face mask or AirFit F40   Let me know if you haven't heard something in the 2-3 weeks about the CPAP   Change supplies... Every month Mask cushions and/or nasal pillows CPAP machine filters Every 3 months Mask frame (not including the headgear) CPAP tubing Every 6 months Mask headgear Chin strap (if applicable) Humidifier water tub   Follow up in 10-12 weeks with Katie Janelle Spellman,NP. Friday PM virtual clinic. If symptoms do not improve or worsen, please contact office for sooner follow up or seek emergency care.

## 2023-02-17 NOTE — Assessment & Plan Note (Signed)
Healthy weight loss encouraged.  Reviewed correlation between obesity and OSA. 

## 2023-02-20 ENCOUNTER — Telehealth: Payer: Self-pay | Admitting: Nurse Practitioner

## 2023-02-20 NOTE — Telephone Encounter (Signed)
Abigail Cruz states order for CPAP machine and supplies has been cancelled due to patient's insurance is not covered. [place] phone number is [phone].

## 2023-02-21 NOTE — Telephone Encounter (Signed)
Lm for patient to provide update.  Sending to [place] as an [place].

## 2023-02-21 NOTE — Telephone Encounter (Signed)
Is that who isn't in network?

## 2023-02-21 NOTE — Telephone Encounter (Signed)
She has [place] who requires [employer]. Please route to PCCs. They'll need to figure out who is in network. Thanks.

## 2023-02-21 NOTE — Telephone Encounter (Signed)
Patient is aware of below message and voiced her understanding.  She is aware to expect a call from [place].  Nothing further needed.

## 2023-04-25 ENCOUNTER — Encounter: Payer: Self-pay | Admitting: Family Medicine

## 2023-04-25 ENCOUNTER — Ambulatory Visit: Admitting: Family Medicine

## 2023-04-25 VITALS — BP 138/78 | HR 75 | Temp 97.9°F | Ht 62.0 in | Wt 272.0 lb

## 2023-04-25 DIAGNOSIS — J019 Acute sinusitis, unspecified: Secondary | ICD-10-CM | POA: Insufficient documentation

## 2023-04-25 DIAGNOSIS — M654 Radial styloid tenosynovitis [de Quervain]: Secondary | ICD-10-CM | POA: Diagnosis not present

## 2023-04-25 MED ORDER — AMOXICILLIN-POT CLAVULANATE 875-125 MG PO TABS: 1.0000 | ORAL_TABLET | Freq: Two times a day (BID) | ORAL | 0 refills | Status: DC

## 2023-04-25 MED ORDER — MELOXICAM 15 MG PO TABS: 15.0000 mg | ORAL_TABLET | Freq: Every day | ORAL | 0 refills | Status: AC | PRN

## 2023-04-25 MED ORDER — HYDROCODONE BIT-HOMATROP MBR 5-1.5 MG/5ML PO SOLN: 5.0000 mL | Freq: Three times a day (TID) | ORAL | 0 refills | Status: DC | PRN

## 2023-04-25 NOTE — Assessment & Plan Note (Signed)
 Thumb spica splint. Avoid triggering activities. Mobic as directed.

## 2023-04-25 NOTE — Progress Notes (Signed)
 Subjective:  Patient ID: Abigail Cruz, female    DOB: Mar 13, 1965  Age: 58 y.o. MRN: [id number]  CC:   Chief Complaint  Patient presents with   Sinusitis    Thick yellow mucus. Post nasal drip and cough. Concern for sinus infection from pollen.  OTC medication to little relief.    thumb    Thumb on right hand is sore and throbbing at night x's 6 months    HPI:  58 year old female presents for evaluation of the above.  Patient reports that she has had ongoing allergies which she typically has this time a year.  She states that her symptoms have been severe and worsening as of Friday.  She reports postnasal drip, congestion, discolored nasal discharge.  Patient is concerned that she has respiratory infection.  She states that she has used numerous over-the-counter treatments without resolution.  Patient also reports a 71-month history of intermittent pain of the left thumb.  Recent worsening after fishing.  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   Allergies 08/11/2015   h/o Nephrolithiasis  08/11/2015   Vitamin D deficiency 08/11/2015    Social Hx   Social History   Socioeconomic History   Marital status: Married    Spouse name: Not on file   Number of children: Not on file   Years of education: Not on file   Highest education level: Not on file  Occupational History   Not on file  Tobacco Use   Smoking status: Never    Passive exposure: Past   Smokeless tobacco: Never  Vaping Use   Vaping status: Never Used  Substance and Sexual Activity   Alcohol use: No   Drug use: No   Sexual activity: Yes    Partners: Male    Birth control/protection: Surgical    Comment: btl  Other Topics Concern   Not on file   Social History Narrative   Not on file   Social Drivers of Health   Financial Resource Strain: Not on file  Food Insecurity: Not on file  Transportation Needs: Not on file  Physical Activity: Not on file  Stress: Not on file  Social Connections: Not on file    Review of Systems Per HPI  Objective:  BP 138/78   Pulse 75   Temp 97.9 F (36.6 C)   Ht 5\' 2"  (1.575 m)   Wt 272 lb (123.4 kg)   SpO2 98%   BMI 49.75 kg/m      04/25/2023   11:06 AM 11/18/2022   11:44 AM 08/04/2022    8:51 AM  BP/Weight  Systolic BP 138 130 140  Diastolic BP 78 70 90  Wt. (Lbs) 272 275.2   BMI 49.75 kg/m2 50.33 kg/m2     Physical Exam Vitals and nursing note reviewed.  Constitutional:      General: She is not in acute distress.    Appearance: Normal appearance. She is obese.  HENT:     Head: Normocephalic and atraumatic.     Right Ear: Tympanic membrane normal.     Left Ear: Tympanic membrane normal.     Mouth/Throat:     Pharynx: Oropharynx is clear.  Cardiovascular:     Rate and Rhythm: Normal rate and regular  rhythm.  Pulmonary:     Effort: Pulmonary effort is normal.     Breath sounds: Normal breath sounds. No wheezing, rhonchi or rales.  Musculoskeletal:     Comments: Left hand - tenderness of the 1st dorsal compartment over the radial styloid.  Neurological:     Mental Status: She is alert.     Lab Results  Component Value Date   WBC 3.6 08/02/2022   HGB 13.3 08/02/2022   HCT 41.4 08/02/2022   PLT 295 08/02/2022   GLUCOSE 100 (H) 08/02/2022   CHOL 161 08/25/2021   TRIG 138 08/25/2021   HDL 45 08/25/2021   LDLDIRECT 97 03/31/2020   LDLCALC 92 08/25/2021   ALT 25 08/02/2022   AST 33 08/02/2022   NA 139 08/02/2022   K 4.8 08/02/2022   CL 102 08/02/2022   CREATININE 0.70 08/02/2022   BUN 15 08/02/2022   CO2 24 08/02/2022   TSH 1.450 08/02/2022   HGBA1C 5.4 08/02/2022     Assessment & Plan:  Acute sinusitis, recurrence not specified, unspecified  location Assessment & Plan: Treating with Augmentin. Hycodan for cough.  Orders: -     Amoxicillin-Pot Clavulanate; Take 1 tablet by mouth 2 (two) times daily.  Dispense: 14 tablet; Refill: 0 -     HYDROcodone Bit-Homatrop MBr; Take 5 mLs by mouth every 8 (eight) hours as needed for cough.  Dispense: 120 mL; Refill: 0  De Quervain's tenosynovitis Assessment & Plan: Thumb spica splint. Avoid triggering activities. Mobic as directed.   Orders: -     Meloxicam; Take 1 tablet (15 mg total) by mouth daily as needed for pain.  Dispense: 30 tablet; Refill: 0    Follow-up:  Return if symptoms worsen or fail to improve.  Everlene Other DO [facility]

## 2023-04-25 NOTE — Assessment & Plan Note (Signed)
Treating with Augmentin. Hycodan for cough.

## 2023-04-25 NOTE — Patient Instructions (Signed)
 Medications as directed.  Take care  Dr. Adriana Simas

## 2023-05-22 ENCOUNTER — Ambulatory Visit (INDEPENDENT_AMBULATORY_CARE_PROVIDER_SITE_OTHER): Admitting: Family Medicine

## 2023-05-22 ENCOUNTER — Encounter: Payer: Self-pay | Admitting: Family Medicine

## 2023-05-22 ENCOUNTER — Ambulatory Visit (HOSPITAL_COMMUNITY)
Admission: RE | Admit: 2023-05-22 | Discharge: 2023-05-22 | Disposition: A | Discharge status: Home / Self Care | Source: Ambulatory Visit | Attending: Family Medicine | Admitting: Family Medicine

## 2023-05-22 VITALS — BP 150/90 | HR 87 | Temp 98.1°F | Ht 62.0 in | Wt 272.0 lb

## 2023-05-22 DIAGNOSIS — J988 Other specified respiratory disorders: Secondary | ICD-10-CM | POA: Diagnosis present

## 2023-05-22 DIAGNOSIS — J019 Acute sinusitis, unspecified: Secondary | ICD-10-CM

## 2023-05-22 MED ORDER — HYDROCODONE BIT-HOMATROP MBR 5-1.5 MG/5ML PO SOLN: 5.0000 mL | Freq: Three times a day (TID) | ORAL | 0 refills | Status: DC | PRN

## 2023-05-22 MED ORDER — DOXYCYCLINE HYCLATE 100 MG PO TABS: 100.0000 mg | ORAL_TABLET | Freq: Two times a day (BID) | ORAL | 0 refills | Status: DC

## 2023-05-22 NOTE — Patient Instructions (Signed)
 Medication as prescribed.  Awaiting Xray.  Take care  Dr. Debrah Fan

## 2023-05-23 DIAGNOSIS — J988 Other specified respiratory disorders: Secondary | ICD-10-CM | POA: Insufficient documentation

## 2023-05-23 NOTE — Assessment & Plan Note (Addendum)
 Acute illness with systemic symptoms.  Chest x-ray was obtained and was independently reviewed by me.  Interpretation: No acute cardiopulmonary abnormality.  No evidence of pneumonia.  Placing on doxycycline.  Hycodan for cough.  Awaiting COVID/flu testing.

## 2023-05-23 NOTE — Progress Notes (Signed)
 Subjective:  Patient ID: Abigail Cruz, female    DOB: 1965-11-18  Age: 58 y.o. MRN: [id number]  CC:   Chief Complaint  Patient presents with   Cough    Body aches, runny nose, chills, sweats     HPI:  58 year old female presents for evaluation of the above.  Patient states that she has been sick since Friday.  She reports subjective fever, chills, body aches, cough, headache, frequent urination, runny nose.  Recent travel.  Has been using Mucinex as well as Advil Cold and Sinus without relief.  Also using NyQuil.  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   Allergies 08/11/2015   h/o Nephrolithiasis  08/11/2015   Vitamin D  deficiency 08/11/2015    Social Hx   Social History   Socioeconomic History   Marital status: Married    Spouse name: Not on file   Number of children: Not on file   Years of education: Not on file   Highest education level: Not on file  Occupational History   Not on file  Tobacco Use   Smoking status: Never    Passive exposure: Past   Smokeless tobacco: Never  Vaping Use   Vaping status: Never Used  Substance and Sexual Activity   Alcohol use: No   Drug use: No   Sexual activity: Yes    Partners: Male    Birth control/protection: Surgical    Comment: btl  Other Topics Concern   Not on file  Social History Narrative   Not on file   Social Drivers of Health   Financial Resource Strain: Not on file  Food Insecurity: Not on file  Transportation Needs: Not on file  Physical Activity: Not on file  Stress: Not on file  Social Connections: Not on file    Review of Systems Per HPI  Objective:  BP (!) 150/90   Pulse 87   Temp 98.1 F (36.7 C)   Ht 5\' 2"  (1.575 m)    Wt 272 lb (123.4 kg)   SpO2 98%   BMI 49.75 kg/m      05/22/2023    4:42 PM 04/25/2023   11:06 AM 11/18/2022   11:44 AM  BP/Weight  Systolic BP 150 138 130  Diastolic BP 90 78 70  Wt. (Lbs) 272 272 275.2  BMI 49.75 kg/m2 49.75 kg/m2 50.33 kg/m2    Physical Exam Vitals and nursing note reviewed.  Constitutional:      General: She is not in acute distress.    Appearance: She is obese.  HENT:     Head: Normocephalic and atraumatic.  Eyes:     General:        Right eye: No discharge.        Left eye: No discharge.     Conjunctiva/sclera: Conjunctivae normal.  Cardiovascular:     Rate and Rhythm: Normal rate and regular rhythm.  Pulmonary:     Effort: Pulmonary effort is normal.     Breath sounds: Wheezing present.  Neurological:     Mental Status: She is alert.     Lab Results  Component Value Date   WBC 3.6 08/02/2022   HGB 13.3 08/02/2022   HCT 41.4 08/02/2022   PLT 295 08/02/2022  GLUCOSE 100 (H) 08/02/2022   CHOL 161 08/25/2021   TRIG 138 08/25/2021   HDL 45 08/25/2021   LDLDIRECT 97 03/31/2020   LDLCALC 92 08/25/2021   ALT 25 08/02/2022   AST 33 08/02/2022   NA 139 08/02/2022   K 4.8 08/02/2022   CL 102 08/02/2022   CREATININE 0.70 08/02/2022   BUN 15 08/02/2022   CO2 24 08/02/2022   TSH 1.450 08/02/2022   HGBA1C 5.4 08/02/2022     Assessment & Plan:  Respiratory infection Assessment & Plan: Acute illness with systemic symptoms.  Chest x-ray was obtained and was independently reviewed by me.  Interpretation: No acute cardiopulmonary abnormality.  No evidence of pneumonia.  Placing on doxycycline.  Hycodan for cough.  Awaiting COVID/flu testing.  Orders: -     DG Chest 2 View -     HYDROcodone  Bit-Homatrop MBr; Take 5 mLs by mouth every 8 (eight) hours as needed for cough.  Dispense: 120 mL; Refill: 0 -     Doxycycline Hyclate; Take 1 tablet (100 mg total) by mouth 2 (two) times daily. Take with food and water.  Dispense: 20 tablet; Refill: 0 -      COVID-19, Flu A+B and RSV    Follow-up:  Return if symptoms worsen or fail to improve.  Kathleen Papa DO [facility]

## 2023-05-25 LAB — SPECIMEN STATUS REPORT

## 2023-05-25 LAB — COVID-19, FLU A+B AND RSV
Influenza A, NAA: NOT DETECTED
Influenza B, NAA: NOT DETECTED
RSV, NAA: NOT DETECTED
SARS-CoV-2, NAA: NOT DETECTED

## 2023-05-28 ENCOUNTER — Encounter: Payer: Self-pay | Admitting: Family Medicine

## 2023-07-31 ENCOUNTER — Encounter: Payer: Self-pay | Admitting: Family Medicine

## 2023-07-31 ENCOUNTER — Other Ambulatory Visit (HOSPITAL_COMMUNITY): Payer: Self-pay

## 2023-07-31 ENCOUNTER — Telehealth: Payer: Self-pay | Admitting: Pharmacy Technician

## 2023-07-31 ENCOUNTER — Ambulatory Visit: Admitting: Family Medicine

## 2023-07-31 DIAGNOSIS — Z6841 Body Mass Index (BMI) 40.0 and over, adult: Secondary | ICD-10-CM

## 2023-07-31 MED ORDER — SEMAGLUTIDE-WEIGHT MANAGEMENT 1.7 MG/0.75ML ~~LOC~~ SOAJ: 1.7000 mg | SUBCUTANEOUS | 0 refills | Status: AC

## 2023-07-31 MED ORDER — SEMAGLUTIDE-WEIGHT MANAGEMENT 0.5 MG/0.5ML ~~LOC~~ SOAJ: 0.5000 mg | SUBCUTANEOUS | 0 refills | Status: AC

## 2023-07-31 MED ORDER — SEMAGLUTIDE-WEIGHT MANAGEMENT 2.4 MG/0.75ML ~~LOC~~ SOAJ: 2.4000 mg | SUBCUTANEOUS | 1 refills | Status: DC

## 2023-07-31 MED ORDER — SEMAGLUTIDE-WEIGHT MANAGEMENT 0.25 MG/0.5ML ~~LOC~~ SOAJ: 0.2500 mg | SUBCUTANEOUS | 0 refills | Status: AC

## 2023-07-31 MED ORDER — SEMAGLUTIDE-WEIGHT MANAGEMENT 1 MG/0.5ML ~~LOC~~ SOAJ: 1.0000 mg | SUBCUTANEOUS | 0 refills | Status: AC

## 2023-07-31 NOTE — Assessment & Plan Note (Signed)
 Has had regular follow-up in the past and has had bariatric surgery.  Has seen nutrition.  Still struggling with weight.  Will send in Wegovy  to see if it will be covered by her insurance.

## 2023-07-31 NOTE — Telephone Encounter (Signed)
 Pharmacy Patient Advocate Encounter   Received notification from CoverMyMeds that prior authorization for Wegovy  0.25MG /0.5ML auto-injectors is required/requested.   Insurance verification completed.   The patient is insured through [facility] .   Per test claim: PA required; PA submitted to above mentioned insurance via LATENT Key/confirmation #/EOC [id number] Status is pending

## 2023-07-31 NOTE — Progress Notes (Signed)
 Subjective:  Patient ID: Abigail Cruz, female    DOB: 13-Dec-1965  Age: 58 y.o. MRN: [id number]  CC:   Chief Complaint  Patient presents with   dsicuss weight loss meds    HPI:  58 year old female with morbid obesity with prior sleeve gastrectomy presents for discussion regarding weight loss medication.  Patient has had prior bariatric surgery and has been followed by bariatrics.  Has had regular visits with nutrition in the past.  Patient states that she is still struggling with her weight.  She is interested in GLP-1 medication regarding weight loss.  She would like to discuss this today.  Patient also recently had a biceps tendon rupture with repair.  She is having postoperative pain.  I advised her that she should not take any NSAIDs due to her prior history of bariatric surgery.  She can take Tylenol  regularly.  Advised follow-up with her surgeon.  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   Allergies 08/11/2015   h/o Nephrolithiasis  08/11/2015   Vitamin D  deficiency 08/11/2015    Social Hx   Social History   Socioeconomic History   Marital status: Married    Spouse name: Not on file   Number of children: Not on file   Years of education: Not on file   Highest education level: Not on file  Occupational History   Not on file  Tobacco Use   Smoking status: Never    Passive exposure: Past   Smokeless tobacco: Never  Vaping Use   Vaping status: Never Used  Substance and Sexual Activity   Alcohol use: No   Drug use: No   Sexual activity: Yes    Partners: Male    Birth control/protection: Surgical    Comment: btl  Other Topics Concern   Not on file  Social History Narrative   Not on file   Social Drivers of Health   Financial Resource Strain: Not on file  Food Insecurity: Not on file   Transportation Needs: Not on file  Physical Activity: Not on file  Stress: Not on file  Social Connections: Not on file    Review of Systems Per HPI  Objective:  BP (!) 141/83   Pulse 75   Temp 97.9 F (36.6 C)   Ht 5' 2 (1.575 m)   Wt 274 lb (124.3 kg)   SpO2 99%   BMI 50.12 kg/m      07/31/2023    1:09 PM 05/22/2023    4:42 PM 04/25/2023   11:06 AM  BP/Weight  Systolic BP 141 150 138  Diastolic BP 83 90 78  Wt. (Lbs) 274 272 272  BMI 50.12 kg/m2 49.75 kg/m2 49.75 kg/m2    Physical Exam Vitals and nursing note reviewed.  Constitutional:      Appearance: Normal appearance. She is obese.  HENT:     Head: Normocephalic and atraumatic.  Cardiovascular:     Rate and Rhythm: Normal rate and regular rhythm.  Pulmonary:     Effort: Pulmonary effort is normal.     Breath sounds: Normal breath sounds.  Neurological:     Mental Status: She is alert.  Psychiatric:        Mood and Affect: Mood normal.        Behavior: Behavior normal.     Lab Results  Component Value  Date   WBC 3.6 08/02/2022   HGB 13.3 08/02/2022   HCT 41.4 08/02/2022   PLT 295 08/02/2022   GLUCOSE 100 (H) 08/02/2022   CHOL 161 08/25/2021   TRIG 138 08/25/2021   HDL 45 08/25/2021   LDLDIRECT 97 03/31/2020   LDLCALC 92 08/25/2021   ALT 25 08/02/2022   AST 33 08/02/2022   NA 139 08/02/2022   K 4.8 08/02/2022   CL 102 08/02/2022   CREATININE 0.70 08/02/2022   BUN 15 08/02/2022   CO2 24 08/02/2022   TSH 1.450 08/02/2022   HGBA1C 5.4 08/02/2022     Assessment & Plan:  Morbid obesity (HCC) Assessment & Plan: Has had regular follow-up in the past and has had bariatric surgery.  Has seen nutrition.  Still struggling with weight.  Will send in Wegovy  to see if it will be covered by her insurance.   Other orders -     Semaglutide -Weight Management; Inject 0.25 mg into the skin once a week for 28 days.  Dispense: 2 mL; Refill: 0 -     Semaglutide -Weight Management; Inject 0.5 mg into the  skin once a week for 28 days.  Dispense: 2 mL; Refill: 0 -     Semaglutide -Weight Management; Inject 1 mg into the skin once a week for 28 days.  Dispense: 2 mL; Refill: 0 -     Semaglutide -Weight Management; Inject 1.7 mg into the skin once a week for 28 days.  Dispense: 3 mL; Refill: 0 -     Semaglutide -Weight Management; Inject 2.4 mg into the skin once a week for 28 days.  Dispense: 3 mL; Refill: 1    Leiam Hopwood DO [facility]

## 2023-07-31 NOTE — Patient Instructions (Signed)
 Rx sent for Wegovy . We will see what insurance does/requires.  1000 mg of Tylenol  3 times daily.  Take care  Dr. Bluford

## 2023-08-01 ENCOUNTER — Other Ambulatory Visit (HOSPITAL_COMMUNITY): Payer: Self-pay

## 2023-08-01 NOTE — Telephone Encounter (Signed)
 Pharmacy Patient Advocate Encounter  Received notification from [facility] that Prior Authorization for Wegovy  0.25MG /0.5ML auto-injectors has been APPROVED from 08/01/2023 to 03/03/2024. Ran test claim, Copay is $24.99. This test claim was processed through [facility]- copay amounts may vary at other pharmacies due to pharmacy/plan contracts, or as the patient moves through the different stages of their insurance plan.   PA #/Case ID/Reference #: Key: [id number]

## 2023-08-10 ENCOUNTER — Encounter (HOSPITAL_COMMUNITY): Payer: Self-pay | Admitting: *Deleted

## 2023-09-29 ENCOUNTER — Telehealth: Payer: Self-pay | Admitting: *Deleted

## 2023-09-29 NOTE — Telephone Encounter (Signed)
 Copied from CRM #[id number]. Topic: Clinical - Prescription Issue >> Sep 29, 2023  1:05 PM Selinda RAMAN wrote: Reason for CRM: The patient called in along with her husband as they have been out of town and are driving back. She states she is due for her next injection over the weekend and they put the request in on Tuesday but have not heard from anybody. Please assist as soon as possible as the patient uses  [facility] [place], [place] - [id number]  Phone: [phone] Fax: [phone]  I told him I looked and it looks like the Wegovy  was sent in to get her until December. He said he was going to call the pharmacy back because they told him they needed a signature prom the provider authorizing it. Please assist patient further to make sure she has her medicine for this weekend.

## 2023-09-29 NOTE — Telephone Encounter (Signed)
 Spoke with pharmacist at [facility] they stated they do have refill and will fill it for pick up. Patient notified

## 2023-11-21 ENCOUNTER — Ambulatory Visit: Payer: Self-pay | Admitting: Family Medicine

## 2023-11-21 ENCOUNTER — Encounter: Payer: Self-pay | Admitting: Family Medicine

## 2023-11-21 VITALS — BP 134/74 | HR 80 | Ht 62.0 in | Wt 265.0 lb

## 2023-11-21 DIAGNOSIS — Z130 Encounter for screening for diseases of the blood and blood-forming organs and certain disorders involving the immune mechanism: Secondary | ICD-10-CM

## 2023-11-21 DIAGNOSIS — I10 Essential (primary) hypertension: Secondary | ICD-10-CM

## 2023-11-21 DIAGNOSIS — Z13220 Encounter for screening for lipoid disorders: Secondary | ICD-10-CM | POA: Diagnosis not present

## 2023-11-21 DIAGNOSIS — Z0000 Encounter for general adult medical examination without abnormal findings: Secondary | ICD-10-CM | POA: Diagnosis not present

## 2023-11-21 NOTE — Patient Instructions (Signed)
Follow up in 6 months.  Take care  Dr. Wren Gallaga  

## 2023-11-22 ENCOUNTER — Ambulatory Visit: Payer: Self-pay | Admitting: Family Medicine

## 2023-11-22 DIAGNOSIS — Z0000 Encounter for general adult medical examination without abnormal findings: Secondary | ICD-10-CM | POA: Insufficient documentation

## 2023-11-22 NOTE — Progress Notes (Signed)
 Subjective:  Patient ID: Abigail Cruz, female    DOB: April 03, 1965  Age: 58 y.o. MRN: [id number]  CC:   Chief Complaint  Patient presents with   Annual Exam    HPI:  58 year old female presents for an annual exam.  Patient needs a mammogram.  Advised her to call and get this scheduled.  Needs labs today.  Needs form filled out for her workplace.  Declines immunizations.  Declines hepatitis C and HIV screening.  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   Allergies 08/11/2015   h/o Nephrolithiasis  08/11/2015   Vitamin D  deficiency 08/11/2015    Social Hx   Social History   Socioeconomic History   Marital status: Married    Spouse name: Not on file   Number of children: Not on file   Years of education: Not on file   Highest education level: Not on file  Occupational History   Not on file  Tobacco Use   Smoking status: Never    Passive exposure: Past   Smokeless tobacco: Never  Vaping Use   Vaping status: Never Used  Substance and Sexual Activity   Alcohol use: No   Drug use: No   Sexual activity: Yes    Partners: Male    Birth control/protection: Surgical    Comment: btl  Other Topics Concern   Not on file  Social History Narrative   Not on file   Social Drivers of Health   Financial Resource Strain: Not on file  Food Insecurity: Not on file  Transportation Needs: Not on file  Physical Activity: Not on file  Stress: Not on file  Social Connections: Not on file    Review of Systems  Respiratory: Negative.    Cardiovascular: Negative.    Objective:  BP 134/74   Pulse 80   Ht 5' 2 (1.575 m)   Wt 265 lb (120.2 kg)   SpO2 97%   BMI 48.47 kg/m      11/21/2023    2:23 PM 07/31/2023    1:09 PM 05/22/2023    4:42 PM  BP/Weight  Systolic BP 134  858 150  Diastolic BP 74 83 90  Wt. (Lbs) 265 274 272  BMI 48.47 kg/m2 50.12 kg/m2 49.75 kg/m2    Physical Exam Vitals and nursing note reviewed.  Constitutional:      General: She is not in acute distress.    Appearance: Normal appearance. She is obese.  HENT:     Head: Normocephalic and atraumatic.  Eyes:     General:        Right eye: No discharge.        Left eye: No discharge.     Conjunctiva/sclera: Conjunctivae normal.  Cardiovascular:     Rate and Rhythm: Normal rate and regular rhythm.  Pulmonary:     Effort: Pulmonary effort is normal.     Breath sounds: Normal breath sounds. No wheezing, rhonchi or rales.  Abdominal:     General: There is no distension.     Palpations: Abdomen is soft.     Tenderness: There is no abdominal tenderness.  Neurological:     Mental Status: She is alert.  Psychiatric:        Mood and Affect: Mood normal.  Behavior: Behavior normal.     Lab Results  Component Value Date   WBC 8.8 11/21/2023   HGB 14.8 11/21/2023   HCT 45.6 11/21/2023   PLT 294 11/21/2023   GLUCOSE 83 11/21/2023   CHOL 169 11/21/2023   TRIG 269 (H) 11/21/2023   HDL 48 11/21/2023   LDLDIRECT 97 03/31/2020   LDLCALC 77 11/21/2023   ALT 12 11/21/2023   AST 16 11/21/2023   NA 142 11/21/2023   K 4.1 11/21/2023   CL 102 11/21/2023   CREATININE 0.79 11/21/2023   BUN 15 11/21/2023   CO2 25 11/21/2023   TSH 1.450 08/02/2022   HGBA1C 4.9 11/21/2023     Assessment & Plan:  Annual physical exam Assessment & Plan: Preventative health care items discussed.  This has been updated in the EMR.  Labs today.  Will fill out form required for work once labs return.   Hypertension, unspecified type -     CMP14+EGFR -     Microalbumin / creatinine urine ratio  Morbid obesity (HCC) -     Hemoglobin A1c  Screening, lipid -     Lipid panel  Screening for deficiency anemia -     CBC    Follow-up: 6 months  Kalimah Capurro Bluford DO [facility]

## 2023-11-22 NOTE — Assessment & Plan Note (Signed)
 Preventative health care items discussed.  This has been updated in the EMR.  Labs today.  Will fill out form required for work once labs return.

## 2023-11-23 LAB — CBC
Hematocrit: 45.6 % (ref 34.0–46.6)
Hemoglobin: 14.8 g/dL (ref 11.1–15.9)
MCH: 29.2 pg (ref 26.6–33.0)
MCHC: 32.5 g/dL (ref 31.5–35.7)
MCV: 90 fL (ref 79–97)
Platelets: 294 x10E3/uL (ref 150–450)
RBC: 5.06 x10E6/uL (ref 3.77–5.28)
RDW: 13.3 % (ref 11.7–15.4)
WBC: 8.8 x10E3/uL (ref 3.4–10.8)

## 2023-11-23 LAB — CMP14+EGFR
ALT: 12 IU/L (ref 0–32)
AST: 16 IU/L (ref 0–40)
Albumin: 4.1 g/dL (ref 3.8–4.9)
Alkaline Phosphatase: 101 IU/L (ref 49–135)
BUN/Creatinine Ratio: 19 (ref 9–23)
BUN: 15 mg/dL (ref 6–24)
Bilirubin Total: 0.3 mg/dL (ref 0.0–1.2)
CO2: 25 mmol/L (ref 20–29)
Calcium: 9.6 mg/dL (ref 8.7–10.2)
Chloride: 102 mmol/L (ref 96–106)
Creatinine, Ser: 0.79 mg/dL (ref 0.57–1.00)
Globulin, Total: 2.4 g/dL (ref 1.5–4.5)
Glucose: 83 mg/dL (ref 70–99)
Potassium: 4.1 mmol/L (ref 3.5–5.2)
Sodium: 142 mmol/L (ref 134–144)
Total Protein: 6.5 g/dL (ref 6.0–8.5)
eGFR: 88 mL/min/1.73 (ref >59)

## 2023-11-23 LAB — LIPID PANEL
Chol/HDL Ratio: 3.5 ratio (ref 0.0–4.4)
Cholesterol, Total: 169 mg/dL (ref 100–199)
HDL: 48 mg/dL (ref >39)
LDL Chol Calc (NIH): 77 mg/dL (ref 0–99)
Triglycerides: 269 mg/dL — ABNORMAL HIGH (ref 0–149)
VLDL Cholesterol Cal: 44 mg/dL — ABNORMAL HIGH (ref 5–40)

## 2023-11-23 LAB — MICROALBUMIN / CREATININE URINE RATIO
Creatinine, Urine: 294.7 mg/dL
Microalb/Creat Ratio: 5 mg/g{creat} (ref 0–29)
Microalbumin, Urine: 14.7 ug/mL

## 2023-11-23 LAB — HEMOGLOBIN A1C
Est. average glucose Bld gHb Est-mCnc: 94 mg/dL
Hgb A1c MFr Bld: 4.9 % (ref 4.8–5.6)

## 2023-12-11 ENCOUNTER — Ambulatory Visit: Payer: Self-pay
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
## 2023-12-12 ENCOUNTER — Encounter: Payer: Self-pay | Admitting: Physician Assistant

## 2023-12-12 ENCOUNTER — Ambulatory Visit (HOSPITAL_COMMUNITY)
Admission: RE | Admit: 2023-12-12 | Discharge: 2023-12-12 | Disposition: A | Discharge status: Home / Self Care | Source: Ambulatory Visit | Attending: Physician Assistant | Admitting: Physician Assistant

## 2023-12-12 ENCOUNTER — Ambulatory Visit (INDEPENDENT_AMBULATORY_CARE_PROVIDER_SITE_OTHER): Admitting: Physician Assistant

## 2023-12-12 VITALS — BP 131/86 | HR 75 | Temp 97.3°F | Ht 62.0 in | Wt 263.8 lb

## 2023-12-12 DIAGNOSIS — M546 Pain in thoracic spine: Secondary | ICD-10-CM | POA: Insufficient documentation

## 2023-12-12 MED ORDER — TIZANIDINE HCL 4 MG PO TABS: 4.0000 mg | ORAL_TABLET | Freq: Three times a day (TID) | ORAL | 0 refills | Status: AC | PRN

## 2023-12-12 MED ORDER — PREDNISONE 20 MG PO TABS: 40.0000 mg | ORAL_TABLET | Freq: Every day | ORAL | 0 refills | Status: AC
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
## 2023-12-18 ENCOUNTER — Ambulatory Visit: Payer: Self-pay | Admitting: Physician Assistant

## 2024-01-04 ENCOUNTER — Ambulatory Visit: Admitting: Family Medicine

## 2024-01-04 ENCOUNTER — Encounter: Payer: Self-pay | Admitting: Family Medicine

## 2024-01-04 DIAGNOSIS — J988 Other specified respiratory disorders: Secondary | ICD-10-CM | POA: Diagnosis not present

## 2024-01-04 MED ORDER — HYDROCODONE BIT-HOMATROP MBR 5-1.5 MG/5ML PO SOLN: 5.0000 mL | Freq: Three times a day (TID) | ORAL | 0 refills | Status: AC | PRN

## 2024-01-04 MED ORDER — AMOXICILLIN-POT CLAVULANATE 875-125 MG PO TABS: 1.0000 | ORAL_TABLET | Freq: Two times a day (BID) | ORAL | 0 refills | Status: AC

## 2024-01-04 NOTE — Progress Notes (Signed)
 Subjective:  Patient ID: Abigail Cruz, female    DOB: 1965/08/07  Age: 58 y.o. MRN: [id number]  CC:   Chief Complaint  Patient presents with   possible sinus infection     Nasal congestion, post nasal drip, mouth breathing due to congestion, throat clearing and irritated tickle in throat    HPI:  58 year old female presents for evaluation of the above.  Patient reports that she has been sick for the past 5 days.  Reports nasal congestion, postnasal drip, sore throat, cough.  No documented fever.  Feels poorly.  Has used over-the-counter medication as well as a humidifier without resolution.  Patient feels that she has a sinus infection.  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   Allergies 08/11/2015   h/o Nephrolithiasis  08/11/2015   Vitamin D  deficiency 08/11/2015    Social Hx   Social History   Socioeconomic History   Marital status: Married    Spouse name: Not on file   Number of children: Not on file   Years of education: Not on file   Highest education level: Not on file  Occupational History   Not on file  Tobacco Use   Smoking status: Never    Passive exposure: Past   Smokeless tobacco: Never  Vaping Use   Vaping status: Never Used  Substance and Sexual Activity   Alcohol use: No   Drug use: No   Sexual activity: Yes    Partners: Male    Birth control/protection: Surgical    Comment: btl  Other Topics Concern   Not on file  Social History Narrative   Not on file   Social Drivers of Health   Tobacco Use: Low Risk (01/04/2024)   Patient History    Smoking Tobacco Use: Never    Smokeless Tobacco Use: Never    Passive Exposure: Past  Financial Resource Strain: Not on file  Food Insecurity: Not  on file  Transportation Needs: Not on file  Physical Activity: Not on file  Stress: Not on file  Social Connections: Not on file  Depression (PHQ2-9): Medium Risk (01/04/2024)   Depression (PHQ2-9)    PHQ-2 Score: 6  Alcohol Screen: Not on file  Housing: Not on file  Utilities: Not on file  Health Literacy: Not on file    Review of Systems Per HPI  Objective:  BP (!) 155/86   Pulse 84   Temp (!) 97 F (36.1 C)   Ht 5' 2 (1.575 m)   Wt 257 lb (116.6 kg)   SpO2 98%   BMI 47.01 kg/m      01/04/2024   10:09 AM 12/12/2023   10:48 AM 12/12/2023   10:32 AM  BP/Weight  Systolic BP 155 131 142  Diastolic BP 86 86 74  Wt. (Lbs) 257  263.75  BMI 47.01 kg/m2  48.24 kg/m2    Physical Exam Vitals and nursing note reviewed.  Constitutional:      General: She is not in acute distress.    Appearance: Normal appearance.  HENT:     Head: Normocephalic and atraumatic.     Right Ear: Tympanic membrane normal.     Left Ear: Tympanic membrane normal.     Nose: Congestion present.  Eyes:  General:        Right eye: No discharge.        Left eye: No discharge.     Conjunctiva/sclera: Conjunctivae normal.  Cardiovascular:     Rate and Rhythm: Normal rate and regular rhythm.  Pulmonary:     Effort: Pulmonary effort is normal.     Breath sounds: Normal breath sounds. No wheezing, rhonchi or rales.  Neurological:     Mental Status: She is alert.  Psychiatric:        Mood and Affect: Mood normal.        Behavior: Behavior normal.     Lab Results  Component Value Date   WBC 8.8 11/21/2023   HGB 14.8 11/21/2023   HCT 45.6 11/21/2023   PLT 294 11/21/2023   GLUCOSE 83 11/21/2023   CHOL 169 11/21/2023   TRIG 269 (H) 11/21/2023   HDL 48 11/21/2023   LDLDIRECT 97 03/31/2020   LDLCALC 77 11/21/2023   ALT 12 11/21/2023   AST 16 11/21/2023   NA 142 11/21/2023   K 4.1 11/21/2023   CL 102 11/21/2023   CREATININE 0.79 11/21/2023   BUN 15 11/21/2023   CO2 25 11/21/2023    TSH 1.450 08/02/2022   HGBA1C 4.9 11/21/2023     Assessment & Plan:  Respiratory infection Assessment & Plan: Treating with Augmentin .  Hycodan for cough.  Orders: -     HYDROcodone  Bit-Homatrop MBr; Take 5 mLs by mouth every 8 (eight) hours as needed for cough.  Dispense: 120 mL; Refill: 0 -     Amoxicillin -Pot Clavulanate; Take 1 tablet by mouth 2 (two) times daily.  Dispense: 14 tablet; Refill: 0    Follow-up:  Return if symptoms worsen or fail to improve.  Jacqulyn Ahle DO [facility]

## 2024-01-04 NOTE — Assessment & Plan Note (Signed)
Treating with Augmentin. Hycodan for cough.

## 2024-01-21 ENCOUNTER — Other Ambulatory Visit: Payer: Self-pay | Admitting: Family Medicine

## 2024-02-04 ENCOUNTER — Emergency Department (HOSPITAL_COMMUNITY)
Admission: EM | Admit: 2024-02-04 | Discharge: 2024-02-05 | Disposition: A | Attending: Emergency Medicine | Admitting: Emergency Medicine

## 2024-02-04 DIAGNOSIS — Z91040 Latex allergy status: Secondary | ICD-10-CM | POA: Insufficient documentation

## 2024-02-04 DIAGNOSIS — R0789 Other chest pain: Secondary | ICD-10-CM | POA: Insufficient documentation

## 2024-02-04 DIAGNOSIS — M79652 Pain in left thigh: Secondary | ICD-10-CM | POA: Diagnosis present

## 2024-02-05 ENCOUNTER — Other Ambulatory Visit: Payer: Self-pay

## 2024-02-05 ENCOUNTER — Encounter (HOSPITAL_COMMUNITY): Payer: Self-pay

## 2024-02-05 ENCOUNTER — Emergency Department (HOSPITAL_COMMUNITY)

## 2024-02-05 LAB — CBC WITH DIFFERENTIAL/PLATELET
Abs Immature Granulocytes: 0.03 K/uL (ref 0.00–0.07)
Basophils Absolute: 0.1 K/uL (ref 0.0–0.1)
Basophils Relative: 1 %
Eosinophils Absolute: 0.4 K/uL (ref 0.0–0.5)
Eosinophils Relative: 5 %
HCT: 39.8 % (ref 36.0–46.0)
Hemoglobin: 13 g/dL (ref 12.0–15.0)
Immature Granulocytes: 0 %
Lymphocytes Relative: 32 %
Lymphs Abs: 2.8 K/uL (ref 0.7–4.0)
MCH: 29.7 pg (ref 26.0–34.0)
MCHC: 32.7 g/dL (ref 30.0–36.0)
MCV: 90.9 fL (ref 80.0–100.0)
Monocytes Absolute: 0.9 K/uL (ref 0.1–1.0)
Monocytes Relative: 10 %
Neutro Abs: 4.4 K/uL (ref 1.7–7.7)
Neutrophils Relative %: 52 %
Platelets: 231 K/uL (ref 150–400)
RBC: 4.38 MIL/uL (ref 3.87–5.11)
RDW: 13.2 % (ref 11.5–15.5)
WBC: 8.5 K/uL (ref 4.0–10.5)
nRBC: 0 % (ref 0.0–0.2)

## 2024-02-05 LAB — TROPONIN T, HIGH SENSITIVITY: Troponin T High Sensitivity: <15 ng/L (ref 0–19)

## 2024-02-05 LAB — BASIC METABOLIC PANEL WITH GFR
Anion gap: 9 (ref 5–15)
BUN: 17 mg/dL (ref 6–20)
CO2: 27 mmol/L (ref 22–32)
Calcium: 8.8 mg/dL — ABNORMAL LOW (ref 8.9–10.3)
Chloride: 103 mmol/L (ref 98–111)
Creatinine, Ser: 0.84 mg/dL (ref 0.44–1.00)
GFR, Estimated: >60 mL/min
Glucose, Bld: 92 mg/dL (ref 70–99)
Potassium: 4.1 mmol/L (ref 3.5–5.1)
Sodium: 138 mmol/L (ref 135–145)

## 2024-02-05 LAB — D-DIMER, QUANTITATIVE: D-Dimer, Quant: <0.27 ug{FEU}/mL (ref 0.00–0.50)

## 2024-02-05 NOTE — ED Notes (Signed)
 Pt discharged per provider order. All discharge instructions were discussed with pt and understanding verbalized. At time of DC pt is alert, ambulatory, and able to make needs known. Pt voices no concerns at this time. Pt took all personal belongings home.

## 2024-02-05 NOTE — ED Provider Notes (Signed)
 "  EMERGENCY DEPARTMENT AT [facility] Provider Note   CSN: [id number] Arrival date & time: 02/04/24  2351     Patient presents with: Hypotension   Abigail Cruz is a 59 y.o. female.   Patient presents to the emergency department for evaluation of leg pain.  Patient reports that she first felt a stabbing pain in the mid thigh earlier today.  Pain has been waxing and waning since then.  She took a muscle relaxer tonight but it did not help.  At some point after that she noticed that her blood pressures were low, presented to the ED for further evaluation.  She reports that she had some slight chest discomfort earlier, thought it might have been indigestion.       Prior to Admission medications  Medication Sig Start Date End Date Taking? Authorizing Provider  amoxicillin -clavulanate (AUGMENTIN ) 875-125 MG tablet Take 1 tablet by mouth 2 (two) times daily. 01/04/24   Cook, Jayce G, DO  fexofenadine (ALLEGRA) 180 MG tablet Take 180 mg by mouth daily as needed for allergies.    [provider]  fluticasone  (FLONASE ) 50 MCG/ACT nasal spray 1 spray each nostril following sinus rinses twice daily Patient taking differently: Place 1 spray into both nostrils daily as needed (allergies.). 11/22/18   Opalski, Deborah, DO  gabapentin (NEURONTIN) 300 MG capsule TAKE 1 CAPSULE BY MOUTH 3 TIMES DAILY AT BEDTIME FOR 30 DAYS.    [provider]  HYDROcodone  bit-homatropine (HYCODAN) 5-1.5 MG/5ML syrup Take 5 mLs by mouth every 8 (eight) hours as needed for cough. 01/04/24   Cook, Jayce G, DO  meloxicam  (MOBIC ) 15 MG tablet Take 1 tablet (15 mg total) by mouth daily as needed for pain. 04/25/23   Cook, Jayce G, DO  tiZANidine  (ZANAFLEX ) 4 MG tablet Take 1 tablet (4 mg total) by mouth every 8 (eight) hours as needed for muscle spasms. 12/12/23   Grooms, Courtney, PA-C  WEGOVY  2.4 MG/0.75ML SOAJ SQ injection Inject 2.4 mg into the skin once a week for 28 days. 01/22/24    Cook, Jayce G, DO    Allergies: Bee venom, Iodine, Other, Latex, Tape, and Shellfish protein-containing drug products    Review of Systems  Updated Vital Signs BP 105/61   Pulse 62   Temp 97.7 F (36.5 C)   Resp 14   Ht 5' 2 (1.575 m)   Wt 116.1 kg   LMP 12/28/2020   SpO2 99%   BMI 46.82 kg/m   Physical Exam Vitals and nursing note reviewed.  Constitutional:      General: She is not in acute distress.    Appearance: She is well-developed.  HENT:     Head: Normocephalic and atraumatic.     Mouth/Throat:     Mouth: Mucous membranes are moist.  Eyes:     General: Vision grossly intact. Gaze aligned appropriately.     Extraocular Movements: Extraocular movements intact.     Conjunctiva/sclera: Conjunctivae normal.  Cardiovascular:     Rate and Rhythm: Normal rate and regular rhythm.     Pulses: Normal pulses.          Dorsalis pedis pulses are 2+ on the left side.     Heart sounds: Normal heart sounds, S1 normal and S2 normal. No murmur heard.    No friction rub. No gallop.  Pulmonary:     Effort: Pulmonary effort is normal. No respiratory distress.     Breath sounds: Normal breath sounds.  Abdominal:  General: Bowel sounds are normal.     Palpations: Abdomen is soft.     Tenderness: There is no abdominal tenderness. There is no guarding or rebound.     Hernia: No hernia is present.  Musculoskeletal:        General: No swelling.     Cervical back: Full passive range of motion without pain, normal range of motion and neck supple. No spinous process tenderness or muscular tenderness. Normal range of motion.     Right lower leg: No edema.     Left lower leg: No swelling or tenderness. No edema.     Comments: No calf swelling, no calf tenderness, no venous cords, negative Homan  Skin:    General: Skin is warm and dry.     Capillary Refill: Capillary refill takes less than 2 seconds.     Findings: No ecchymosis, erythema, rash or wound.  Neurological:     General:  No focal deficit present.     Mental Status: She is alert and oriented to person, place, and time.     GCS: GCS eye subscore is 4. GCS verbal subscore is 5. GCS motor subscore is 6.     Cranial Nerves: Cranial nerves 2-12 are intact.     Sensory: Sensation is intact.     Motor: Motor function is intact.     Coordination: Coordination is intact.  Psychiatric:        Attention and Perception: Attention normal.        Mood and Affect: Mood normal.        Speech: Speech normal.        Behavior: Behavior normal.     (all labs ordered are listed, but only abnormal results are displayed) Labs Reviewed  BASIC METABOLIC PANEL WITH GFR - Abnormal; Notable for the following components:      Result Value   Calcium 8.8 (*)    All other components within normal limits  CBC WITH DIFFERENTIAL/PLATELET  D-DIMER, QUANTITATIVE  TROPONIN T, HIGH SENSITIVITY    EKG: EKG Interpretation Date/Time:  Monday February 05 2024 00:51:04 EST Ventricular Rate:  60 PR Interval:  186 QRS Duration:  91 QT Interval:  462 QTC Calculation: 462 R Axis:   49  Text Interpretation: Sinus rhythm Low voltage, precordial leads Confirmed by Haze Lonni PARAS ([id number]) on 02/05/2024 12:52:52 AM  Radiology: [facility] Chest Port 1 View Result Date: 02/05/2024 EXAM: 1 VIEW(S) XRAY OF THE CHEST 02/05/2024 01:35:00 AM COMPARISON: 05/22/2023 CLINICAL HISTORY: Chest pain. FINDINGS: LUNGS AND PLEURA: No focal pulmonary opacity. No pleural effusion. No pneumothorax. HEART AND MEDIASTINUM: No acute abnormality of the cardiac and mediastinal silhouettes. BONES AND SOFT TISSUES: No acute osseous abnormality. IMPRESSION: 1. No acute cardiopulmonary pathology. Electronically signed by: Oneil Devonshire MD 02/05/2024 01:37 AM EST RP Workstation: [id number]     Procedures   Medications Ordered in the ED - No data to display                                  Medical Decision Making Amount and/or Complexity of Data Reviewed Labs: ordered.  Decision-making details documented in ED Course. Radiology: ordered and independent interpretation performed. Decision-making details documented in ED Course. ECG/medicine tests: ordered and independent interpretation performed. Decision-making details documented in ED Course.   Differential diagnosis considered includes, but not limited to:  Orthopedic injury; arthritis; septic arthritis; ischemic limb; DVT; cellulitis; phlebitis; ACS; pneumonia; GERD  Presents to the emergency department primarily for limb pain.  Patient had onset of a sharp stabbing pain in the thigh earlier today and pain has been waxing and waning throughout the day.  She then developed some slight chest discomfort which she thought might be indigestion, no history of coronary artery disease.  Patient not experiencing shortness of breath.  No hypoxia, tachycardia or tachypnea.  Felt to be low risk for PE.  D-dimer is negative, no further PE workup necessary.  This likely rules out DVT, as examination does not support DVT.  Patient with proximal limb pain, no distal symptoms.  Patient with palpable pulses, no evidence of ischemia.  No skin changes to suggest infection.  Suspect thigh pain is musculoskeletal in nature.  Hypotension may have been secondary to muscle relaxer use, normotensive here in the ED.  Cardiac evaluation includes EKG without ischemic changes, serial high-sensitivity troponins.  Patient reassured, no further workup necessary.     Final diagnoses:  Thigh pain, musculoskeletal, left  Atypical chest pain    ED Discharge Orders     None          Melessa Cowell, Lonni PARAS, MD 02/05/24 [id number]  "

## 2024-02-05 NOTE — ED Triage Notes (Signed)
 Complaining of pain in the upper left leg. Started around the knee and has moved to the groin area. Noticed her blood pressure was low at home around 90/49, 88/58, and another which is abnormal for her. Denies shortness of breath or chest pain.

## 2024-02-07 ENCOUNTER — Other Ambulatory Visit: Payer: Self-pay | Admitting: Orthopaedic Surgery

## 2024-02-07 DIAGNOSIS — M25621 Stiffness of right elbow, not elsewhere classified: Secondary | ICD-10-CM

## 2024-02-25 ENCOUNTER — Other Ambulatory Visit

## 2024-05-24 ENCOUNTER — Ambulatory Visit: Admitting: Family Medicine
